# Patient Record
Sex: Female | Born: 1975 | Race: Black or African American | Hispanic: No | Marital: Single | Smoking: Former smoker
Health system: Southern US, Community
[De-identification: ages and names within clinical notes are randomized; demographics above are authoritative.]

## PROBLEM LIST (undated history)

## (undated) DIAGNOSIS — N8003 Adenomyosis of the uterus: Secondary | ICD-10-CM

## (undated) DIAGNOSIS — J40 Bronchitis, not specified as acute or chronic: Secondary | ICD-10-CM

## (undated) DIAGNOSIS — K219 Gastro-esophageal reflux disease without esophagitis: Secondary | ICD-10-CM

## (undated) DIAGNOSIS — D649 Anemia, unspecified: Secondary | ICD-10-CM

## (undated) DIAGNOSIS — M5430 Sciatica, unspecified side: Secondary | ICD-10-CM

## (undated) DIAGNOSIS — N921 Excessive and frequent menstruation with irregular cycle: Secondary | ICD-10-CM

## (undated) DIAGNOSIS — N800 Endometriosis of uterus: Secondary | ICD-10-CM

## (undated) DIAGNOSIS — N809 Endometriosis, unspecified: Secondary | ICD-10-CM

## (undated) DIAGNOSIS — I2699 Other pulmonary embolism without acute cor pulmonale: Secondary | ICD-10-CM

## (undated) DIAGNOSIS — N750 Cyst of Bartholin's gland: Secondary | ICD-10-CM

## (undated) DIAGNOSIS — J189 Pneumonia, unspecified organism: Secondary | ICD-10-CM

## (undated) DIAGNOSIS — F419 Anxiety disorder, unspecified: Secondary | ICD-10-CM

## (undated) HISTORY — PX: DILATION AND CURETTAGE OF UTERUS: SHX78

## (undated) HISTORY — PX: ABDOMINAL HYSTERECTOMY: SHX81

## (undated) HISTORY — DX: Endometriosis of uterus: N80.0

## (undated) HISTORY — DX: Adenomyosis of the uterus: N80.03

## (undated) HISTORY — PX: UMBILICAL HERNIA REPAIR: SHX196

## (undated) HISTORY — PX: TUBAL LIGATION: SHX77

## (undated) HISTORY — DX: Other pulmonary embolism without acute cor pulmonale: I26.99

## (undated) HISTORY — DX: Excessive and frequent menstruation with irregular cycle: N92.1

## (undated) HISTORY — PX: LAPAROSCOPY: SHX197

## (undated) HISTORY — PX: LUMBAR SPINE SURGERY: SHX701

## (undated) HISTORY — PX: BACK SURGERY: SHX140

## (undated) HISTORY — DX: Endometriosis, unspecified: N80.9

---

## 1996-07-08 HISTORY — PX: CHOLECYSTECTOMY, LAPAROSCOPIC: SHX56

## 2006-10-15 ENCOUNTER — Emergency Department (HOSPITAL_COMMUNITY): Admission: EM | Admit: 2006-10-15 | Discharge: 2006-10-15 | Payer: Self-pay | Admitting: Emergency Medicine

## 2007-02-26 ENCOUNTER — Emergency Department (HOSPITAL_COMMUNITY): Admission: EM | Admit: 2007-02-26 | Discharge: 2007-02-26 | Payer: Self-pay | Admitting: Emergency Medicine

## 2007-07-09 HISTORY — PX: CERVICAL SPINE SURGERY: SHX589

## 2007-07-12 ENCOUNTER — Emergency Department (HOSPITAL_COMMUNITY): Admission: EM | Admit: 2007-07-12 | Discharge: 2007-07-12 | Payer: Self-pay | Admitting: Emergency Medicine

## 2008-01-15 ENCOUNTER — Emergency Department (HOSPITAL_COMMUNITY): Admission: EM | Admit: 2008-01-15 | Discharge: 2008-01-15 | Payer: Self-pay | Admitting: Emergency Medicine

## 2008-05-30 ENCOUNTER — Emergency Department (HOSPITAL_COMMUNITY): Admission: EM | Admit: 2008-05-30 | Discharge: 2008-05-30 | Payer: Self-pay | Admitting: Emergency Medicine

## 2008-06-10 ENCOUNTER — Emergency Department (HOSPITAL_COMMUNITY): Admission: EM | Admit: 2008-06-10 | Discharge: 2008-06-10 | Payer: Self-pay | Admitting: Emergency Medicine

## 2008-06-14 ENCOUNTER — Ambulatory Visit (HOSPITAL_COMMUNITY): Admission: RE | Admit: 2008-06-14 | Discharge: 2008-06-14 | Payer: Self-pay | Admitting: Emergency Medicine

## 2008-06-14 ENCOUNTER — Emergency Department (HOSPITAL_COMMUNITY): Admission: EM | Admit: 2008-06-14 | Discharge: 2008-06-14 | Payer: Self-pay | Admitting: Emergency Medicine

## 2008-06-20 ENCOUNTER — Emergency Department (HOSPITAL_COMMUNITY): Admission: EM | Admit: 2008-06-20 | Discharge: 2008-06-20 | Payer: Self-pay | Admitting: Emergency Medicine

## 2008-06-24 ENCOUNTER — Inpatient Hospital Stay (HOSPITAL_COMMUNITY): Admission: EM | Admit: 2008-06-24 | Discharge: 2008-06-30 | Payer: Self-pay | Admitting: Emergency Medicine

## 2008-06-28 ENCOUNTER — Encounter (INDEPENDENT_AMBULATORY_CARE_PROVIDER_SITE_OTHER): Payer: Self-pay | Admitting: Orthopedic Surgery

## 2009-03-03 ENCOUNTER — Ambulatory Visit (HOSPITAL_COMMUNITY): Admission: RE | Admit: 2009-03-03 | Discharge: 2009-03-03 | Payer: Self-pay | Admitting: Orthopedic Surgery

## 2010-02-25 ENCOUNTER — Emergency Department (HOSPITAL_COMMUNITY): Admission: EM | Admit: 2010-02-25 | Discharge: 2010-02-25 | Payer: Self-pay | Admitting: Emergency Medicine

## 2010-03-05 ENCOUNTER — Ambulatory Visit (HOSPITAL_COMMUNITY): Admission: RE | Admit: 2010-03-05 | Discharge: 2010-03-05 | Payer: Self-pay | Admitting: Orthopedic Surgery

## 2010-03-19 ENCOUNTER — Emergency Department (HOSPITAL_COMMUNITY): Admission: EM | Admit: 2010-03-19 | Discharge: 2010-03-19 | Payer: Self-pay | Admitting: Emergency Medicine

## 2010-04-23 ENCOUNTER — Emergency Department (HOSPITAL_COMMUNITY): Admission: EM | Admit: 2010-04-23 | Discharge: 2010-04-23 | Payer: Self-pay | Admitting: Emergency Medicine

## 2010-09-19 LAB — GC/CHLAMYDIA PROBE AMP, GENITAL: GC Probe Amp, Genital: NEGATIVE

## 2010-09-19 LAB — URINALYSIS, ROUTINE W REFLEX MICROSCOPIC
Bilirubin Urine: NEGATIVE
Ketones, ur: NEGATIVE mg/dL
Nitrite: NEGATIVE
Specific Gravity, Urine: 1.023 (ref 1.005–1.030)

## 2010-09-19 LAB — URINE MICROSCOPIC-ADD ON

## 2010-09-20 LAB — COMPREHENSIVE METABOLIC PANEL WITH GFR
ALT: 15 U/L (ref 0–35)
AST: 19 U/L (ref 0–37)
Albumin: 3.6 g/dL (ref 3.5–5.2)
Alkaline Phosphatase: 74 U/L (ref 39–117)
BUN: 6 mg/dL (ref 6–23)
CO2: 24 meq/L (ref 19–32)
Calcium: 9.2 mg/dL (ref 8.4–10.5)
Chloride: 109 meq/L (ref 96–112)
Creatinine, Ser: 0.76 mg/dL (ref 0.4–1.2)
GFR calc non Af Amer: >60 mL/min
Glucose, Bld: 83 mg/dL (ref 70–99)
Potassium: 3.7 meq/L (ref 3.5–5.1)
Sodium: 141 meq/L (ref 135–145)
Total Bilirubin: 0.4 mg/dL (ref 0.3–1.2)
Total Protein: 6.5 g/dL (ref 6.0–8.3)

## 2010-09-20 LAB — LIPASE, BLOOD: Lipase: 21 U/L (ref 11–59)

## 2010-09-20 LAB — URINE CULTURE
Colony Count: >=100000
Culture  Setup Time: 201109121848

## 2010-09-20 LAB — GC/CHLAMYDIA PROBE AMP, GENITAL
Chlamydia, DNA Probe: NEGATIVE
GC Probe Amp, Genital: NEGATIVE

## 2010-09-20 LAB — URINALYSIS, ROUTINE W REFLEX MICROSCOPIC
Bilirubin Urine: NEGATIVE
Glucose, UA: NEGATIVE mg/dL
Hgb urine dipstick: NEGATIVE
Ketones, ur: NEGATIVE mg/dL
Nitrite: POSITIVE — AB
Protein, ur: NEGATIVE mg/dL
Specific Gravity, Urine: 1.013 (ref 1.005–1.030)
Urobilinogen, UA: 0.2 mg/dL (ref 0.0–1.0)
pH: 6.5 (ref 5.0–8.0)

## 2010-09-20 LAB — DIFFERENTIAL
Basophils Relative: 0 % (ref 0–1)
Lymphocytes Relative: 19 % (ref 12–46)
Neutro Abs: 9.2 10*3/uL — ABNORMAL HIGH (ref 1.7–7.7)
Neutrophils Relative %: 72 % (ref 43–77)

## 2010-09-20 LAB — WET PREP, GENITAL: Yeast Wet Prep HPF POC: NONE SEEN

## 2010-09-20 LAB — CBC
Hemoglobin: 9.5 g/dL — ABNORMAL LOW (ref 12.0–15.0)
MCH: 23.3 pg — ABNORMAL LOW (ref 26.0–34.0)
Platelets: 460 10*3/uL — ABNORMAL HIGH (ref 150–400)
RDW: 17.5 % — ABNORMAL HIGH (ref 11.5–15.5)
WBC: 12.8 10*3/uL — ABNORMAL HIGH (ref 4.0–10.5)

## 2010-09-20 LAB — URINE MICROSCOPIC-ADD ON

## 2010-09-20 LAB — POCT PREGNANCY, URINE: Preg Test, Ur: NEGATIVE

## 2010-10-29 ENCOUNTER — Inpatient Hospital Stay (HOSPITAL_COMMUNITY): Payer: Medicaid Other

## 2010-10-29 ENCOUNTER — Inpatient Hospital Stay (HOSPITAL_COMMUNITY)
Admission: AD | Admit: 2010-10-29 | Discharge: 2010-10-29 | Disposition: A | Discharge status: Home / Self Care | Payer: Medicaid Other | Source: Ambulatory Visit | Attending: Obstetrics & Gynecology | Admitting: Obstetrics & Gynecology

## 2010-10-29 DIAGNOSIS — R109 Unspecified abdominal pain: Secondary | ICD-10-CM | POA: Insufficient documentation

## 2010-10-29 DIAGNOSIS — N949 Unspecified condition associated with female genital organs and menstrual cycle: Secondary | ICD-10-CM | POA: Insufficient documentation

## 2010-10-29 DIAGNOSIS — N938 Other specified abnormal uterine and vaginal bleeding: Secondary | ICD-10-CM | POA: Insufficient documentation

## 2010-10-29 LAB — WET PREP, GENITAL
Trich, Wet Prep: NONE SEEN
Yeast Wet Prep HPF POC: NONE SEEN

## 2010-10-29 LAB — CBC
Hemoglobin: 10.7 g/dL — ABNORMAL LOW (ref 12.0–15.0)
MCH: 24.5 pg — ABNORMAL LOW (ref 26.0–34.0)
MCHC: 31 g/dL (ref 30.0–36.0)
Platelets: 432 10*3/uL — ABNORMAL HIGH (ref 150–400)

## 2010-10-30 LAB — GC/CHLAMYDIA PROBE AMP, GENITAL: GC Probe Amp, Genital: NEGATIVE

## 2010-11-14 ENCOUNTER — Encounter: Payer: Self-pay | Admitting: Family Medicine

## 2010-11-20 NOTE — Op Note (Signed)
NAMEESSIE, LAGUNES NO.:  [id number]   MEDICAL RECORD NO.:  [id number]          PATIENT TYPE:  INP   LOCATION:  5039                         FACILITY:  [facility]   PHYSICIAN:  Nelda Severe, MD      DATE OF BIRTH:  1976-06-09   DATE OF PROCEDURE:  06/28/2008  DATE OF DISCHARGE:                               OPERATIVE REPORT   SURGEON:  Nelda Severe, MD   ASSISTANT:  Lianne Cure, PA   PREOPERATIVE DIAGNOSES:  C5-C6 cervical spondylosis/stenosis, C6-C7 disk  herniation left side with severe left brachialgia.   POSTOPERATIVE DIAGNOSES:  C5-C6 cervical spondylosis/stenosis, C6-C7  disk herniation left side with severe left brachialgia.   OPERATIVE PROCEDURE:  Anterior decompression and fusion C5-C6 and C6-C7  with autogenous iliac crest graft (left side), interbody cages, screws,  and plate.   INDICATIONS FOR SURGERY:  Severe intractable left-sided arm pain  requiring hospitalization.   DESCRIPTION OF PROCEDURE:  The patient was placed under general  endotracheal anesthesia.  Foley catheter was placed in the bladder.  Intravenous antibiotics were infused prophylactically.  Sequential  compression devices were placed on both lower extremities.  She was  positioned supine on the [place] flat-top table.  The arms were tucked  at the sides and a rolled sheet was placed transversely under the  shoulder blades to allow for extension of the neck.  The head was  supported on a foam donut.  The lower extremities were supported on  pillows.   The left iliac crest area was prepped with DuraPrep and draped with  towels and the towels secured with Ioban.  The anterior cervical spine  area was draped with towels and the towels secured with Ioban after  sterile preparation with DuraPrep.  Prior to prepping, we palpated the  carotid tubercle and placed a slightly oblique transverse skin marker at  the appropriate level for the incision.   After draping was complete, a  time-out was held in which the patient was  identified, the preoperative diagnosis, the intended surgery, etc.   A slightly oblique incision was made on the left side from midline to  the anterior border of the sternocleidomastoid just above the level of  the carotid tubercle.  Platysma was incised in line with the incision.  The deep cervical fascia was bluntly dissected down into the  prevertebral area.  The longus coli muscles were identified bilaterally.  The carotid sheath was retracted laterally and the esophagus and trachea  retracted medially.   A spinal needle bent in Z fashion was placed what we perceived to be C5-  C6.  A cross-table lateral radiograph was ordered and taken which proved  to be at C5-C6.  In the meantime, while waiting for the radiograph to be  processed, we made a short incision over the left iliac crest  approximately 3 inches posterior to the anterior superior iliac spine.  Dissection was carried down to the insertion of the external oblique  aponeurosis into the iliac crest.  Insertion of the muscle was released  using cutting current and the top iliac crest identified.  Retractors  were placed.  The superior aspect of the crest was fenestrated using a  small gouge and moderate quantity of bone graft harvested from between  the inner and outer tables of the ileum using a curette.  A piece of  Gelfoam was placed in the bony defect to control bleeding.   We then returned our attention to the cervical wound.  Having positively  identified the correct level, longus coli muscles were then mobilized  from body of C5 to body of C7 bilaterally.  A Shadow-Line retractor was  then placed with the retractor tubes deep to the longus colli muscles.  Caspar distracting pins were then placed above and below the C5-C6 disk  space and the distractor attached and tension applied.  I then incised  the anterior disk annulus sharply.  We then enucleated the disk with a   combination of curettes, rongeurs etc.  In the posterior aspect of the  disk, it was very narrow, and there were obvious spondylophytes  posteriorly.  A high-speed bur was used to bur away the posterior spinal  sites above and below the disk space thereby opening it up.  Eventually,  we were able to curette away posterior longitudinal ligament from the  posterior aspect of the vertebral bodies above and below and a 2-mm  Kerrison was admitted.  We then formed a central decompression above and  below and working laterally to the left side where the neural foramen  was decompressed.  We then made sure that all endplate cartilage had  been curetted away or burred away, and the disk space was sized for a 6-  mm interbody fusion cage.  The cage was packed with bone, inserted in  the disk space, and the distractor tension released and the cage gripped  by the adjacent endplates.   We then moved distally, moving the retractor blades, removing the C5  Caspar pin and placing it in the C7 body and applying distraction at the  C6-C7 level.  The annulus was incised in similar fashion that I  described for C5-C6.  The disk was then enucleated.  There was a copious  quantity of nucleus which was somewhat fragmented.  We curetted away  disk back to the posterior longitudinal ligament.  Ultimately, I was  able to use a small probe to detect the rent in posterior ligament  through which the nucleus pulposus had herniated on the left side.  The  posterior longitudinal ligament was then incised on the left side and  several moderately large fragments of degenerate nucleus pulposus were  hooked out of the spinal canal and neural foramen.  We further  decompressed centrally.  When I felt that an adequate decompression had  been performed, decompressive efforts were ceased.  We made sure that  all endplate cartilage above and below had been burred or curetted away.  The space was sized to a 7-mm cage.  The  cage was packed with bone graft  and the graft placed.  The distractor was then let off and the endplates  gripped the cage.   Remaining graft was then packed on the right side of the C6-C7 cage and  anteriorly in front of both cages.  A 39-mm titanium plate was then  chosen and applied with two screws at C5, two screws C6, and two screws  at C7.  Cross-table lateral radiographs taken showed a satisfactory  position of screws, plate, interbody cages in the correct level.   The cervical wound was  irrigated.  A small Silastic drain was placed in  the depths of the wound and brought out through the skin to the right  side.  It was secured with a 3-0 nylon suture in a basket weave fashion.  Platysma was closed using interrupted 0 Vicryl suture.  Subcutaneous  layer and skin were closed using continuous subcuticular 3-0 undyed  Vicryl suture.  A bacitracin ointment dressing was applied and a gauze  dressing applied.  This was secured with a green surgical towel.   A 1-0 Vicryl suture was placed in the fascia in the depths of the iliac  crest harvest wound.  The subcutaneous tissue was closed using  interrupted 2-0 Vicryl suture.  The skin was closed using a subcuticular  3-0 undyed Vicryl in running fashion.  Antibiotic ointment dressing was  applied to the iliac crest wound and secured with Hypafix tape.   The blood loss for the procedure was negligible.  There were no  intraoperative complications.  Sponge and needle counts were correct.  The patient was not yet been examined in the recovery room  postoperatively.  Therefore, no neurological examination was reported  here.      Nelda Severe, MD  Electronically Signed     MT/MEDQ  D:  06/28/2008  T:  06/28/2008  Job:  [id number]

## 2010-11-23 NOTE — Discharge Summary (Signed)
Megan Chandler, PAONE NO.:  [id number]   MEDICAL RECORD NO.:  [id number]          PATIENT TYPE:  INP   LOCATION:  5039                         FACILITY:  [facility]   PHYSICIAN:  Nelda Severe, MD      DATE OF BIRTH:  1976-01-16   DATE OF ADMISSION:  06/24/2008  DATE OF DISCHARGE:  06/30/2008                               DISCHARGE SUMMARY   She was admitted [facility] on June 24, 2008, through the emergency  room secondary to neck pain and left arm pain.  She was admitted to 5000  secondary to C6-7 herniated disk, left side brachialgia, severe needed  for pain control, and possible disk excision and fusion.  On June 26, 2008, she was worse.  I started her on Chantix.  She did undergo  disk excision and fusion by Dr. Nelda Severe with the assistance of  Lianne Cure, PA-C and she had postoperative vomiting.  She did have  a positive urinary tract infection.  She was treated with Cipro.  Her  white count was elevated at 18.1.  Hemoglobin was stable at 10.2.  The  surgery was performed on June 28, 2008, anterior decompression and  fusion at C5-6, C6-7.  We did have physical therapy for consultation for  mobility.  The patient was placed in a soft collar for stability  secondary to pain.  Left arm felt significantly better postoperatively.  Her throat was sore, but overall neurovascularly motor intact.  Slight  decreased grip strength on the left 4+/5, on the right 5/5.  Incision  clean, dry, and intact.  Drain was removed.  The patient is instructed  on a soft diet for 7 days' total postoperatively which was carried out  in the hospital on June 30, 2008.  The patient was afebrile.  Vital  signs were stable.  Incision is clean, dry, and intact.  No dressing was  needed.  Distally, neurovascularly motor intact bilateral upper  extremities, equal.  She was discharged home.   DIAGNOSIS:  Cervical disk herniation at C5-6, C6-7.   DIET:  Soft.   DISPOSITION:  Stable.  Follow up in 1 week with Dr. Nelda Severe.  Hip  incision was also clean, dry, and intact from iliac crest bone graft.  She was discharged home with Norco 10 for pain medication and Robaxin  for muscle spasm medication.      Lianne Cure, P.A.      Nelda Severe, MD  Electronically Signed    MC/MEDQ  D:  08/17/2008  T:  08/18/2008  Job:  [id number]

## 2010-12-12 ENCOUNTER — Ambulatory Visit: Payer: Self-pay | Admitting: Obstetrics and Gynecology

## 2011-01-17 ENCOUNTER — Inpatient Hospital Stay (HOSPITAL_COMMUNITY)
Admission: EM | Admit: 2011-01-17 | Discharge: 2011-01-17 | Disposition: A | Discharge status: Home / Self Care | Payer: Medicaid Other | Source: Ambulatory Visit | Attending: Obstetrics & Gynecology | Admitting: Obstetrics & Gynecology

## 2011-01-17 ENCOUNTER — Encounter (HOSPITAL_COMMUNITY): Payer: Self-pay | Admitting: *Deleted

## 2011-01-17 DIAGNOSIS — N390 Urinary tract infection, site not specified: Secondary | ICD-10-CM | POA: Insufficient documentation

## 2011-01-17 DIAGNOSIS — N938 Other specified abnormal uterine and vaginal bleeding: Secondary | ICD-10-CM | POA: Insufficient documentation

## 2011-01-17 DIAGNOSIS — N949 Unspecified condition associated with female genital organs and menstrual cycle: Secondary | ICD-10-CM | POA: Insufficient documentation

## 2011-01-17 DIAGNOSIS — R102 Pelvic and perineal pain: Secondary | ICD-10-CM

## 2011-01-17 DIAGNOSIS — N939 Abnormal uterine and vaginal bleeding, unspecified: Secondary | ICD-10-CM

## 2011-01-17 HISTORY — DX: Anemia, unspecified: D64.9

## 2011-01-17 LAB — CBC
HCT: 34.5 % — ABNORMAL LOW (ref 36.0–46.0)
Hemoglobin: 11 g/dL — ABNORMAL LOW (ref 12.0–15.0)
MCV: 79.7 fL (ref 78.0–100.0)
RBC: 4.33 MIL/uL (ref 3.87–5.11)
RDW: 16.9 % — ABNORMAL HIGH (ref 11.5–15.5)
WBC: 15.5 10*3/uL — ABNORMAL HIGH (ref 4.0–10.5)

## 2011-01-17 LAB — URINE MICROSCOPIC-ADD ON

## 2011-01-17 LAB — URINALYSIS, ROUTINE W REFLEX MICROSCOPIC
Glucose, UA: NEGATIVE mg/dL
Protein, ur: NEGATIVE mg/dL
Specific Gravity, Urine: >1.03 — ABNORMAL HIGH (ref 1.005–1.030)
pH: 6 (ref 5.0–8.0)

## 2011-01-17 LAB — WET PREP, GENITAL: Clue Cells Wet Prep HPF POC: NONE SEEN

## 2011-01-17 MED ORDER — KETOROLAC TROMETHAMINE 60 MG/2ML IM SOLN
30.0000 mg | Freq: Once | INTRAMUSCULAR | Status: AC
  Administered 2011-01-17: 30 mg via INTRAMUSCULAR
  Filled 2011-01-17: qty 2

## 2011-01-17 MED ORDER — CIPROFLOXACIN HCL 500 MG PO TABS: 500.0000 mg | ORAL_TABLET | Freq: Two times a day (BID) | ORAL | Status: AC

## 2011-01-17 MED ORDER — TRAMADOL HCL 50 MG PO TABS: 50.0000 mg | ORAL_TABLET | Freq: Four times a day (QID) | ORAL | Status: AC | PRN

## 2011-01-17 NOTE — ED Notes (Signed)
Pt asking for pain medications.  Providers aware, currently tied up.  Pt unable to void, states has been trying all day.  Explained need to r/o  preg and check for UTI.   Will proceed with i/o cath to collect spec.

## 2011-01-17 NOTE — Progress Notes (Signed)
Bleeding for 3wks.  Has been nuvaring for 8 months, had not been having periods.   Here before for the same thing, was told has endometriosis.  At orthopedic today, had spine injections.  Was told iron was low.

## 2011-01-17 NOTE — ED Provider Notes (Signed)
History     Chief Complaint  Patient presents with  . Abdominal Pain   Patient is a 35 y.o. female presenting with abdominal pain. The history is provided by the patient.  Abdominal Pain The primary symptoms of the illness include abdominal pain. The primary symptoms of the illness do not include fever. The current episode started more than 2 days ago.  Additional symptoms associated with the illness include back pain. Symptoms associated with the illness do not include chills.  Megan Chandler 35 y.o. with abdominal pain and vaginal bleeding. Pt. States she saw a doctor in [facility] and had pap and nuva ring. Past 2 months has had irregular bleeding and severe abdominal pain. Was here last month and had u/s. Pt. Told she had endometrosis and tx with Lortab. Has follow up appt. With GYN clinic in August but is out of pain medication and here for medication until her appt..   Past Medical History  Diagnosis Date  . Anemia   . Asthma   . Urinary tract infection   . Chronic low back pain   . Endometriosis     Past Surgical History  Procedure Date  . Dilation and curettage of uterus   . Cholecystectomy, laparoscopic   . Umbilical hernia repair   . Cesarean section   . Back surgery     Family History  Problem Relation Age of Onset  . Hypertension Mother   . Cancer Maternal Aunt   . Cancer Maternal Uncle   . Diabetes Maternal Grandmother     History  Substance Use Topics  . Smoking status: Current Some Day Smoker -- 2 years    Types: Cigars  . Smokeless tobacco: Never Used  . Alcohol Use: 0.0 oz/week    0 Glasses of wine per week    Allergies:  Allergies  Allergen Reactions  . Morphine And Related Anaphylaxis  . Aspirin Itching  . Penicillins Itching  . Vicodin (Hydrocodone-Acetaminophen) Itching    Prescriptions prior to admission  Medication Sig Dispense Refill  . [employer] IBUPROFEN IB PO Take 1 tablet by mouth 4 (four) times daily as needed.        . traMADol  (ULTRAM) 50 MG tablet Take 50 mg by mouth 2 (two) times daily.          Review of Systems  Constitutional: Negative for fever, chills and weight loss.  HENT: Negative.   Eyes: Negative.   Respiratory: Negative.   Gastrointestinal: Positive for abdominal pain.  Musculoskeletal: Positive for back pain.  Skin: Negative.   Neurological: Negative.    Physical Exam General Appearance:    Alert, cooperative, no distress, appears stated age  Head:    Normocephalic, without obvious abnormality, atraumatic        Genitalia:    Normal female without lesions  Extremities:   Extremities normal, atraumatic, no cyanosis or edema  Skin:   Skin color, texture, turgor normal, no rashes or lesions  Neurologic: Alert and oriented, normal gait.     Blood pressure 129/77, pulse 76, temperature 97.3 F (36.3 C), temperature source Oral, resp. rate 22, height 5\' 6"  (1.676 m), weight 150 lb (68.04 kg).  Physical Exam  Nursing note and vitals reviewed. Constitutional: She is oriented to person, place, and time. She appears well-developed and well-nourished.  Neck: Neck supple.  GI: Soft. There is tenderness.    Genitourinary:       Vaginal bleeding is moderate.  Musculoskeletal:  Pt. Evaluated by ortho. Today for back pain and muscle pain.  Neurological: She is alert and oriented to person, place, and time. No cranial nerve deficit.  Skin: Skin is warm and dry.    MAU Course  Procedures  MDM Will treat patient's pain with Toradol now and give Rx for pain medication. She will keep her appt. With the GYN Clinic.   [place], [place] 01/17/11 [phone]

## 2011-01-17 NOTE — ED Notes (Signed)
Delay explained.  Pt tearful.   When using Nuvaring... Would leave in place for 4 wks, then replace.  Never tood a wk off.  Removed 3 days ago, bleeding started about 2 wks ago.  Cramping has been this bad for past wk.  Has been passing quarter sized clots

## 2011-01-18 LAB — GC/CHLAMYDIA PROBE AMP, GENITAL
Chlamydia, DNA Probe: NEGATIVE
GC Probe Amp, Genital: NEGATIVE

## 2011-02-03 ENCOUNTER — Emergency Department (HOSPITAL_COMMUNITY)
Admission: EM | Admit: 2011-02-03 | Discharge: 2011-02-03 | Disposition: A | Discharge status: Home / Self Care | Payer: Medicaid Other | Attending: Emergency Medicine | Admitting: Emergency Medicine

## 2011-02-03 ENCOUNTER — Emergency Department (HOSPITAL_COMMUNITY): Payer: Medicaid Other

## 2011-02-03 DIAGNOSIS — N940 Mittelschmerz: Secondary | ICD-10-CM | POA: Insufficient documentation

## 2011-02-03 DIAGNOSIS — M069 Rheumatoid arthritis, unspecified: Secondary | ICD-10-CM | POA: Insufficient documentation

## 2011-02-03 DIAGNOSIS — K5730 Diverticulosis of large intestine without perforation or abscess without bleeding: Secondary | ICD-10-CM | POA: Insufficient documentation

## 2011-02-03 DIAGNOSIS — R110 Nausea: Secondary | ICD-10-CM | POA: Insufficient documentation

## 2011-02-03 DIAGNOSIS — J45909 Unspecified asthma, uncomplicated: Secondary | ICD-10-CM | POA: Insufficient documentation

## 2011-02-03 DIAGNOSIS — N831 Corpus luteum cyst of ovary, unspecified side: Secondary | ICD-10-CM | POA: Insufficient documentation

## 2011-02-03 DIAGNOSIS — R1031 Right lower quadrant pain: Secondary | ICD-10-CM | POA: Insufficient documentation

## 2011-02-03 DIAGNOSIS — R109 Unspecified abdominal pain: Secondary | ICD-10-CM | POA: Insufficient documentation

## 2011-02-03 LAB — URINE MICROSCOPIC-ADD ON

## 2011-02-03 LAB — URINALYSIS, ROUTINE W REFLEX MICROSCOPIC
Bilirubin Urine: NEGATIVE
Glucose, UA: NEGATIVE mg/dL
Ketones, ur: NEGATIVE mg/dL
Nitrite: NEGATIVE
Protein, ur: NEGATIVE mg/dL
Specific Gravity, Urine: 1.007 (ref 1.005–1.030)
Urobilinogen, UA: 0.2 mg/dL (ref 0.0–1.0)
pH: 7 (ref 5.0–8.0)

## 2011-02-03 LAB — WET PREP, GENITAL

## 2011-02-04 LAB — GC/CHLAMYDIA PROBE AMP, GENITAL
Chlamydia, DNA Probe: NEGATIVE
GC Probe Amp, Genital: NEGATIVE

## 2011-02-15 ENCOUNTER — Ambulatory Visit (INDEPENDENT_AMBULATORY_CARE_PROVIDER_SITE_OTHER): Payer: Medicaid Other | Admitting: Obstetrics & Gynecology

## 2011-02-15 ENCOUNTER — Encounter: Payer: Self-pay | Admitting: Obstetrics & Gynecology

## 2011-02-15 ENCOUNTER — Other Ambulatory Visit (HOSPITAL_COMMUNITY)
Admission: RE | Admit: 2011-02-15 | Discharge: 2011-02-15 | Disposition: A | Discharge status: Home / Self Care | Payer: Medicaid Other | Source: Ambulatory Visit | Attending: Obstetrics & Gynecology | Admitting: Obstetrics & Gynecology

## 2011-02-15 VITALS — BP 115/71 | HR 82 | Temp 97.6°F | Ht 66.0 in | Wt 146.8 lb

## 2011-02-15 DIAGNOSIS — N920 Excessive and frequent menstruation with regular cycle: Secondary | ICD-10-CM

## 2011-02-15 DIAGNOSIS — N949 Unspecified condition associated with female genital organs and menstrual cycle: Secondary | ICD-10-CM | POA: Insufficient documentation

## 2011-02-15 DIAGNOSIS — N938 Other specified abnormal uterine and vaginal bleeding: Secondary | ICD-10-CM | POA: Insufficient documentation

## 2011-02-15 DIAGNOSIS — N921 Excessive and frequent menstruation with irregular cycle: Secondary | ICD-10-CM | POA: Insufficient documentation

## 2011-02-15 LAB — CBC
MCH: 24.8 pg — ABNORMAL LOW (ref 26.0–34.0)
MCHC: 30.4 g/dL (ref 30.0–36.0)
RDW: 17.8 % — ABNORMAL HIGH (ref 11.5–15.5)

## 2011-02-15 LAB — TSH: TSH: 0.491 u[IU]/mL (ref 0.350–4.500)

## 2011-02-15 LAB — PROLACTIN: Prolactin: 7.1 ng/mL

## 2011-02-15 LAB — POCT PREGNANCY, URINE: Preg Test, Ur: NEGATIVE

## 2011-02-15 MED ORDER — IBUPROFEN 200 MG PO TABS: 600.0000 mg | ORAL_TABLET | Freq: Once | ORAL | Status: DC

## 2011-02-15 NOTE — Progress Notes (Signed)
  Patient is here for workup of abnormal bleeding. Was seen in there MAU and underwent pelvic ultrasound.  Still reports prolonged, heavy vaginal bleeding.  No other symptoms.  Having some spotting now.  Pelvic Ultrasound (02/03/11) Uterus: Measures 10.3 x 5.8 x 7.9 cm. Diffusely inhomogeneous echotexture of the myometrium noted, but no discrete myometrial masses identified.  Endometrium: 6 mm double layer thickness measured transvaginally. Normal trilayer appearance.  Right ovary: Measures 4.0 x 2.9 x 3.4 cm. A 2 x 3 cm corpus luteum is noted with no evidence of internal blood flow. Blood flow is seen within the remainder of the right ovary on color Doppler ultrasound.  Left ovary: Measures 2.3 x 1.9 x 1.5 cm. Normal appearance.  Blood flow is seen within the left ovary on color Doppler ultrasound.  Pulsed Doppler evaluation demonstrates normal low-resistance arterial and venous waveforms in both ovaries.  IMPRESSION:  1. 2 x 3 cm right ovarian corpus luteum noted. No evidence of ovarian mass or torsion.  2. Mildly enlarged uterus with heterogeneous myometrial echotexture, which may be seen with adenomyosis. No discrete fibroids identified. If clinically warranted, pelvis MRI could be performed for further evaluation.  I reviewed patient's PMH, PSH, medications, allergies, prior studies.  Patient counseled regarding need for endometrial biopsy.    Endometrial Biopsy Procedure Note Pre-operative Diagnosis: Menometrorrhagia Post-operative Diagnosis: same Indications: abnormal uterine bleeding Procedure Details  Urine pregnancy test was done and result was negative.  The risks (including infection, bleeding, pain, and uterine perforation) and benefits of the procedure were explained to the patient and Written informed consent was obtained.   The patient was placed in the dorsal lithotomy position.  Bimanual exam showed the uterus to be in the neutral position.  A Graves' speculum inserted in the  vagina, and the cervix prepped with povidone iodine.   A sharp tenaculum was applied to the anterior lip of the cervix for stabilization.  A sterile uterine sound was used to sound the uterus to a depth of 10 cm.  A Pipelle endometrial aspirator was used to sample the endometrium.  Sample was sent for pathologic examination. Condition: Stable Complications: None Plan:  The patient was advised to call for any fever or for prolonged or severe pain or bleeding. She was advised to use NSAID as needed for mild to moderate pain. She was advised to avoid vaginal intercourse for 48 hours or until the bleeding has completely stopped.  A/P:  Will follow up endometrial biopsy results and manage accordingly. Follow up TSH, CBC, prolactin; labs drawn today. Bleeding precautions reviewed.  Perrie Ragin A 02/15/2011 10:21 AM

## 2011-02-15 NOTE — Patient Instructions (Signed)
See patient instruction sheet

## 2011-02-15 NOTE — Progress Notes (Signed)
Addended by: Lynnell Dike on: 02/15/2011 12:21 PM   Modules accepted: Orders

## 2011-02-15 NOTE — Progress Notes (Signed)
Last pap was 6-7 months ago. Bleeding the whole month of July.

## 2011-02-19 ENCOUNTER — Telehealth: Payer: Self-pay | Admitting: *Deleted

## 2011-02-19 NOTE — Telephone Encounter (Signed)
Pt left message requesting results of biopsy from 02/15/11. I called pt back and left message that we will wait to hear from Dr. Macon Large and call her back with pertinent info.

## 2011-02-20 NOTE — Telephone Encounter (Signed)
Called pt- heard message stating that this person is not accepting calls @ this time and to try back later.

## 2011-02-20 NOTE — Telephone Encounter (Signed)
Patient's endometrial biopsy is benign; she can come back for discussion of further management of her bleeding.  OCPs, Mirena, Provera or surgical management will be discussed.

## 2011-02-21 ENCOUNTER — Inpatient Hospital Stay (HOSPITAL_COMMUNITY)
Admission: AD | Admit: 2011-02-21 | Discharge: 2011-02-22 | Disposition: A | Discharge status: Home / Self Care | Payer: Medicaid Other | Source: Ambulatory Visit | Attending: Obstetrics & Gynecology | Admitting: Obstetrics & Gynecology

## 2011-02-21 DIAGNOSIS — N764 Abscess of vulva: Secondary | ICD-10-CM | POA: Insufficient documentation

## 2011-02-21 DIAGNOSIS — N949 Unspecified condition associated with female genital organs and menstrual cycle: Secondary | ICD-10-CM | POA: Insufficient documentation

## 2011-02-21 NOTE — Progress Notes (Signed)
SAYS HAD HAS BEEN BLEEDING X1 MTH, HAD  ENDOMETR  BX ON Friday IN  GYN CLINIC WITH DR Nada Maclachlan.- HAS NOT HEARD RESULTS.  PAIN  THIS BAD AT 10PM.  DID NOT TAKE ANYTHING.

## 2011-02-21 NOTE — Progress Notes (Signed)
ASKING  FOR PAIN PILL.

## 2011-02-21 NOTE — Telephone Encounter (Signed)
Called patient, Left message for patient to return our call.

## 2011-02-22 ENCOUNTER — Emergency Department (HOSPITAL_COMMUNITY)
Admission: EM | Admit: 2011-02-22 | Discharge: 2011-02-22 | Disposition: A | Discharge status: Home / Self Care | Payer: Medicaid Other | Attending: Emergency Medicine | Admitting: Emergency Medicine

## 2011-02-22 DIAGNOSIS — L03319 Cellulitis of trunk, unspecified: Secondary | ICD-10-CM | POA: Insufficient documentation

## 2011-02-22 DIAGNOSIS — N9489 Other specified conditions associated with female genital organs and menstrual cycle: Secondary | ICD-10-CM | POA: Insufficient documentation

## 2011-02-22 DIAGNOSIS — R109 Unspecified abdominal pain: Secondary | ICD-10-CM | POA: Insufficient documentation

## 2011-02-22 DIAGNOSIS — J45909 Unspecified asthma, uncomplicated: Secondary | ICD-10-CM | POA: Insufficient documentation

## 2011-02-22 DIAGNOSIS — Z79899 Other long term (current) drug therapy: Secondary | ICD-10-CM | POA: Insufficient documentation

## 2011-02-22 DIAGNOSIS — L02219 Cutaneous abscess of trunk, unspecified: Secondary | ICD-10-CM | POA: Insufficient documentation

## 2011-02-22 DIAGNOSIS — N949 Unspecified condition associated with female genital organs and menstrual cycle: Secondary | ICD-10-CM | POA: Insufficient documentation

## 2011-02-22 DIAGNOSIS — M069 Rheumatoid arthritis, unspecified: Secondary | ICD-10-CM | POA: Insufficient documentation

## 2011-02-22 LAB — RAPID URINE DRUG SCREEN, HOSP PERFORMED: Opiates: NOT DETECTED

## 2011-02-22 MED ORDER — OXYCODONE-ACETAMINOPHEN 5-325 MG PO TABS
1.0000 | ORAL_TABLET | Freq: Once | ORAL | Status: AC
  Administered 2011-02-22: 1 via ORAL
  Filled 2011-02-22: qty 1

## 2011-02-22 MED ORDER — DICLOXACILLIN SODIUM 500 MG PO CAPS: 500.0000 mg | ORAL_CAPSULE | Freq: Four times a day (QID) | ORAL | Status: DC

## 2011-02-22 MED ORDER — OXYCODONE-ACETAMINOPHEN 5-325 MG PO TABS: 2.0000 | ORAL_TABLET | ORAL | Status: AC | PRN

## 2011-02-22 MED ORDER — CEPHALEXIN 500 MG PO CAPS: 500.0000 mg | ORAL_CAPSULE | Freq: Four times a day (QID) | ORAL | Status: AC

## 2011-02-22 NOTE — Progress Notes (Signed)
DR DOVE  AT BEDSIDE

## 2011-02-22 NOTE — Progress Notes (Signed)
Pt uncooperative for preocedure - CRYING.Marland Kitchen

## 2011-02-22 NOTE — Telephone Encounter (Signed)
Spoke w/pt- informed of Dr. Mont Dutton message.  Pt voiced understanding.  Pt stated that now she is having a different problem and went to MAU yesterday for evaluation. She has swelling of the vagina and was told that she had cellulitis. She was given antibiotic Rx's which she has not filled yet- will get today. She was also told to follow up in our clinic on Monday 8/20 and is requesting appt. I told pt that I did not think we had any availability for that day. I transferred her call to [facility] for appt. Scheduling.

## 2011-02-22 NOTE — ED Provider Notes (Signed)
History     No chief complaint on file.  HPI 35 y.o. female with c/o "pain in vagina" and painful lump in left axilla. Vaginal pain started yesterday, worse tonight, lump under arm became painful tonight. No discharge or bleeding.    Past Medical History  Diagnosis Date  . Anemia   . Asthma   . Urinary tract infection   . Chronic low back pain   . Endometriosis     Past Surgical History  Procedure Date  . Dilation and curettage of uterus   . Cholecystectomy, laparoscopic   . Umbilical hernia repair   . Cesarean section   . Back surgery     Family History  Problem Relation Age of Onset  . Hypertension Mother   . Cancer Maternal Aunt   . Cancer Maternal Uncle   . Diabetes Maternal Grandmother     History  Substance Use Topics  . Smoking status: Former Smoker -- 2 years    Types: Cigars    Quit date: 06/07/2008  . Smokeless tobacco: Never Used  . Alcohol Use: 0.0 oz/week    0 Glasses of wine per week    Allergies:  Allergies  Allergen Reactions  . Morphine And Related Anaphylaxis  . Aspirin Itching  . Penicillins Itching  . Vicodin (Hydrocodone-Acetaminophen) Itching    Prescriptions prior to admission  Medication Sig Dispense Refill  . CVS IBUPROFEN IB PO Take 1 tablet by mouth 4 (four) times daily as needed.        . traMADol (ULTRAM) 50 MG tablet Take 50 mg by mouth 2 (two) times daily.          Review of Systems  Constitutional: Negative.   Respiratory: Negative.   Cardiovascular: Negative.   Gastrointestinal: Negative for nausea, vomiting, abdominal pain, diarrhea and constipation.  Genitourinary: Negative for dysuria, urgency, frequency, hematuria and flank pain.       Negative for vaginal bleeding, vaginal discharge, positive for pain  Musculoskeletal: Negative.   Neurological: Negative.   Psychiatric/Behavioral: Negative.     Physical Exam   Blood pressure 119/81, pulse 91, temperature 98.8 F (37.1 C), temperature source Oral, resp.  rate 18, height 5\' 4"  (1.626 m), weight 65.431 kg (144 lb 4 oz), last menstrual period 02/15/2011.  Physical Exam  Nursing note and vitals reviewed. Constitutional: She appears well-developed and well-nourished. She appears distressed.  Cardiovascular: Normal rate.   Respiratory: Effort normal.    Genitourinary:        MAU Course  Procedures  Dr. Marice Potter consulted for eval/I&D of labial abcess. Pt refused procedure d/t discomfort/anxiety.   Assessment and Plan  Labial abcess - D/C home with abx and f/u in GYN clinic per Dr. Ellin Saba orders [facility] 02/22/2011, 1:29 AM

## 2011-02-22 NOTE — Consult Note (Signed)
I explained the option of incision and drainage of the right vulvar abscess.  Consents were signed.  Ms. Villalona was extremely labile and would not stay still...she was continually getting up from the lithotomy position. I explained several times that I would be unable to do the procedure if she was moving.  I eventuallly declined to do the I & D and agreed to treat her with antibiotics and narcotics.  She will follow up in the gyn clinic.

## 2011-03-06 ENCOUNTER — Ambulatory Visit (INDEPENDENT_AMBULATORY_CARE_PROVIDER_SITE_OTHER): Payer: Medicaid Other | Admitting: Obstetrics & Gynecology

## 2011-03-06 ENCOUNTER — Encounter: Payer: Self-pay | Admitting: Obstetrics & Gynecology

## 2011-03-06 VITALS — BP 117/83 | HR 111 | Temp 100.0°F | Ht 66.0 in | Wt 142.3 lb

## 2011-03-06 DIAGNOSIS — N907 Vulvar cyst: Secondary | ICD-10-CM

## 2011-03-06 DIAGNOSIS — N9089 Other specified noninflammatory disorders of vulva and perineum: Secondary | ICD-10-CM

## 2011-03-06 NOTE — Progress Notes (Signed)
  Subjective:    Patient ID: Megan Chandler, female    DOB: March 05, 1976, 35 y.o.   MRN: [id number]  HPI Ms. Rothrock has a history of cysts of her groin, axilla, vulva.  I saw her in the MAU about 10 days ago.  Initially, she wanted an I&D done then, but after 30 minutes of trying to get her to stay still to do the procedure, I gave up on that course of treatment and gave her antibiotics. She says the cyst has improved greatly.   Review of Systems     Objective:   Physical Exam   Marked reduction of size of tender left labial cyst     Assessment & Plan:  I again offered her an I&D, but she has decided that she can't tolerate staying still today.  I have recommended that when she thinks she can hold still she can come back.

## 2011-03-27 ENCOUNTER — Ambulatory Visit: Payer: Medicaid Other | Admitting: Physician Assistant

## 2011-04-04 LAB — URINE MICROSCOPIC-ADD ON

## 2011-04-04 LAB — URINALYSIS, ROUTINE W REFLEX MICROSCOPIC
Glucose, UA: NEGATIVE
Glucose, UA: NEGATIVE
Ketones, ur: 15 — AB
Nitrite: POSITIVE — AB
Protein, ur: >300 — AB
Specific Gravity, Urine: 1.024
Specific Gravity, Urine: 1.034 — ABNORMAL HIGH
Urobilinogen, UA: 0.2
Urobilinogen, UA: 1
pH: 5.5

## 2011-04-04 LAB — CBC
HCT: 33 — ABNORMAL LOW
Hemoglobin: 10.8 — ABNORMAL LOW
MCHC: 32.6
MCV: 84.9
RBC: 3.89
RDW: 17.2 — ABNORMAL HIGH

## 2011-04-04 LAB — PREGNANCY, URINE: Preg Test, Ur: NEGATIVE

## 2011-04-04 LAB — GC/CHLAMYDIA PROBE AMP, GENITAL
Chlamydia, DNA Probe: NEGATIVE
GC Probe Amp, Genital: NEGATIVE

## 2011-04-12 LAB — URINE CULTURE

## 2011-04-12 LAB — DIFFERENTIAL
Basophils Absolute: 0.1 10*3/uL (ref 0.0–0.1)
Basophils Relative: 0 % (ref 0–1)
Basophils Relative: 1 % (ref 0–1)
Lymphocytes Relative: 20 % (ref 12–46)
Lymphocytes Relative: 3 % — ABNORMAL LOW (ref 12–46)
Lymphs Abs: 0.6 10*3/uL — ABNORMAL LOW (ref 0.7–4.0)
Monocytes Relative: 2 % — ABNORMAL LOW (ref 3–12)
Monocytes Relative: 9 % (ref 3–12)
Neutro Abs: 17 10*3/uL — ABNORMAL HIGH (ref 1.7–7.7)
Neutro Abs: 7.5 10*3/uL (ref 1.7–7.7)
Neutrophils Relative %: 69 % (ref 43–77)
Neutrophils Relative %: 94 % — ABNORMAL HIGH (ref 43–77)

## 2011-04-12 LAB — URINALYSIS, ROUTINE W REFLEX MICROSCOPIC
Bilirubin Urine: NEGATIVE
Glucose, UA: NEGATIVE mg/dL
Hgb urine dipstick: NEGATIVE
Ketones, ur: NEGATIVE mg/dL
Protein, ur: NEGATIVE mg/dL
Urobilinogen, UA: 1 mg/dL (ref 0.0–1.0)

## 2011-04-12 LAB — CBC
HCT: 28.7 % — ABNORMAL LOW (ref 36.0–46.0)
Hemoglobin: 10.2 g/dL — ABNORMAL LOW (ref 12.0–15.0)
Hemoglobin: 9 g/dL — ABNORMAL LOW (ref 12.0–15.0)
MCHC: 32 g/dL (ref 30.0–36.0)
MCV: 82.1 fL (ref 78.0–100.0)
RBC: 3.83 MIL/uL — ABNORMAL LOW (ref 3.87–5.11)
RBC: 3.92 MIL/uL (ref 3.87–5.11)
RDW: 17.6 % — ABNORMAL HIGH (ref 11.5–15.5)
WBC: 18.1 10*3/uL — ABNORMAL HIGH (ref 4.0–10.5)

## 2011-04-12 LAB — POCT I-STAT, CHEM 8
Calcium, Ion: 1.2 mmol/L (ref 1.12–1.32)
Chloride: 103 mEq/L (ref 96–112)
HCT: 34 % — ABNORMAL LOW (ref 36.0–46.0)
Sodium: 139 mEq/L (ref 135–145)
TCO2: 25 mmol/L (ref 0–100)

## 2011-04-12 LAB — BASIC METABOLIC PANEL
BUN: 5 mg/dL — ABNORMAL LOW (ref 6–23)
CO2: 26 mEq/L (ref 19–32)
Calcium: 8.9 mg/dL (ref 8.4–10.5)
Calcium: 8.9 mg/dL (ref 8.4–10.5)
Chloride: 103 mEq/L (ref 96–112)
Creatinine, Ser: 0.77 mg/dL (ref 0.4–1.2)
GFR calc Af Amer: >60 mL/min (ref >60)
GFR calc non Af Amer: >60 mL/min (ref >60)
GFR calc non Af Amer: >60 mL/min (ref >60)
GFR calc non Af Amer: >60 mL/min (ref >60)
Glucose, Bld: 81 mg/dL (ref 70–99)
Glucose, Bld: 84 mg/dL (ref 70–99)
Potassium: 3.4 mEq/L — ABNORMAL LOW (ref 3.5–5.1)
Sodium: 134 mEq/L — ABNORMAL LOW (ref 135–145)
Sodium: 136 mEq/L (ref 135–145)
Sodium: 136 mEq/L (ref 135–145)

## 2011-04-12 LAB — URINE MICROSCOPIC-ADD ON

## 2011-04-12 LAB — PROTIME-INR: Prothrombin Time: 13.8 seconds (ref 11.6–15.2)

## 2011-04-12 LAB — POCT PREGNANCY, URINE: Preg Test, Ur: NEGATIVE

## 2011-04-21 ENCOUNTER — Emergency Department (HOSPITAL_COMMUNITY)
Admission: EM | Admit: 2011-04-21 | Discharge: 2011-04-21 | Disposition: A | Discharge status: Home / Self Care | Payer: Medicaid Other | Attending: Emergency Medicine | Admitting: Emergency Medicine

## 2011-04-21 DIAGNOSIS — N751 Abscess of Bartholin's gland: Secondary | ICD-10-CM | POA: Insufficient documentation

## 2011-04-21 DIAGNOSIS — M069 Rheumatoid arthritis, unspecified: Secondary | ICD-10-CM | POA: Insufficient documentation

## 2011-04-21 DIAGNOSIS — J45909 Unspecified asthma, uncomplicated: Secondary | ICD-10-CM | POA: Insufficient documentation

## 2011-04-21 DIAGNOSIS — N949 Unspecified condition associated with female genital organs and menstrual cycle: Secondary | ICD-10-CM | POA: Insufficient documentation

## 2011-04-21 DIAGNOSIS — G8929 Other chronic pain: Secondary | ICD-10-CM | POA: Insufficient documentation

## 2011-05-03 ENCOUNTER — Encounter (HOSPITAL_COMMUNITY): Payer: Self-pay | Admitting: *Deleted

## 2011-05-03 ENCOUNTER — Inpatient Hospital Stay (HOSPITAL_COMMUNITY)
Admission: AD | Admit: 2011-05-03 | Discharge: 2011-05-03 | Disposition: A | Discharge status: Home / Self Care | Payer: Medicaid Other | Source: Ambulatory Visit | Attending: Obstetrics & Gynecology | Admitting: Obstetrics & Gynecology

## 2011-05-03 DIAGNOSIS — N921 Excessive and frequent menstruation with irregular cycle: Secondary | ICD-10-CM

## 2011-05-03 DIAGNOSIS — N764 Abscess of vulva: Secondary | ICD-10-CM | POA: Insufficient documentation

## 2011-05-03 LAB — CBC
Hemoglobin: 10.6 g/dL — ABNORMAL LOW (ref 12.0–15.0)
MCH: 25.5 pg — ABNORMAL LOW (ref 26.0–34.0)
MCHC: 31.2 g/dL (ref 30.0–36.0)

## 2011-05-03 MED ORDER — OXYCODONE-ACETAMINOPHEN 5-325 MG PO TABS: 2.0000 | ORAL_TABLET | Freq: Four times a day (QID) | ORAL | Status: AC | PRN

## 2011-05-03 MED ORDER — LORAZEPAM 1 MG PO TABS
2.0000 mg | ORAL_TABLET | Freq: Once | ORAL | Status: AC
  Administered 2011-05-03: 2 mg via ORAL
  Filled 2011-05-03: qty 2

## 2011-05-03 MED ORDER — HALOPERIDOL 2 MG PO TABS
2.0000 mg | ORAL_TABLET | Freq: Once | ORAL | Status: AC
  Administered 2011-05-03: 2 mg via ORAL
  Filled 2011-05-03: qty 1

## 2011-05-03 MED ORDER — OXYCODONE-ACETAMINOPHEN 5-325 MG PO TABS
2.0000 | ORAL_TABLET | Freq: Once | ORAL | Status: AC
  Administered 2011-05-03: 2 via ORAL
  Filled 2011-05-03: qty 2

## 2011-05-03 MED ORDER — LIDOCAINE-PRILOCAINE 2.5-2.5 % EX CREA
TOPICAL_CREAM | Freq: Once | CUTANEOUS | Status: AC
  Administered 2011-05-03: 14:00:00 via TOPICAL
  Filled 2011-05-03: qty 5

## 2011-05-03 MED ORDER — LIDOCAINE HCL 1 % IJ SOLN
10.0000 mL | Freq: Once | INTRAMUSCULAR | Status: DC
  Filled 2011-05-03: qty 10

## 2011-05-03 MED ORDER — CLINDAMYCIN HCL 300 MG PO CAPS: 300.0000 mg | ORAL_CAPSULE | Freq: Three times a day (TID) | ORAL | Status: AC

## 2011-05-03 MED ORDER — IBUPROFEN 600 MG PO TABS: 600.0000 mg | ORAL_TABLET | Freq: Four times a day (QID) | ORAL | Status: AC | PRN

## 2011-05-03 NOTE — ED Notes (Signed)
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

## 2011-05-03 NOTE — ED Notes (Signed)
Peri care following I&D of right labial abscess.  Peri pad in place.  Minimal drainage, spec obtained to send for wound culture.  Comfort measures and follow up care.  Reinforced  Use of sitz baths and antibiotics.

## 2011-05-03 NOTE — Progress Notes (Signed)
Has been doing sitz baths 3 times a day

## 2011-05-03 NOTE — ED Notes (Signed)
Clarified - has taken hydrocodone for pain without a problem.

## 2011-05-03 NOTE — ED Notes (Signed)
TIME out completed. Witnessed by Renne Crigler NT/NS and  Ivonne Andrew CNM.  Pt verbalized plan.

## 2011-05-03 NOTE — Progress Notes (Signed)
Wk ago Megan Chandler was at [facility]- they put her to sleep, cut it open and put in a ward catheter, a few days later it fell out.  Initially the swelling went down,  But last night it started swelling real bad and was worse this morning.  (pt arrived by EMS)

## 2011-05-03 NOTE — ED Notes (Signed)
Smith at bedside assessing "numbness". Pt calmer - once again reviewed options with pt and what to expect

## 2011-05-03 NOTE — ED Provider Notes (Signed)
History   The patient is a 35 y.o. year old G83P1011 female who presents to MAU by EMS reporting worsening of a labial abcess. She was seen at [facility] 04/21/11 for same (Dx as Bartholin Cyst). I&D performed under IV sedation by general surgeon and Ward Catheter placed. Seen in MAU and [facility] clinic in August for same. Hx of cyst and abscesses in axilla and groin.    CSN: [id number] Arrival date & time: 05/03/2011 10:28 AM   None     Chief Complaint  Patient presents with  . Cyst    (Consider location/radiation/quality/duration/timing/severity/associated sxs/prior treatment) HPI  Past Medical History  Diagnosis Date  . Anemia   . Asthma   . Urinary tract infection   . Chronic low back pain   . Endometriosis     Past Surgical History  Procedure Date  . Dilation and curettage of uterus   . Cholecystectomy, laparoscopic   . Umbilical hernia repair   . Cesarean section   . Back surgery     Family History  Problem Relation Age of Onset  . Hypertension Mother   . Cancer Maternal Aunt   . Cancer Maternal Uncle   . Diabetes Maternal Grandmother     History  Substance Use Topics  . Smoking status: Former Smoker -- 2 years    Types: Cigars  . Smokeless tobacco: Never Used  . Alcohol Use: 0.0 oz/week    OB History    Grav Para Term Preterm Abortions TAB SAB Ect Mult Living   2 1 1  0 1 0 1 0 0 1      Review of Systems: Pos for severe pain at site of abscess since yesterday. Denies fever, chills, drainage from abscess.  Allergies  Morphine and related; Aspirin; Penicillins; and Vicodin (able to take Percocet)  Home Medications  Percocet (#14) completed >1 week ago  BP 137/80  Pulse 74  Temp(Src) 98.2 F (36.8 C) (Oral)  Resp 20  LMP 04/28/2011  Physical Exam General: Severe distress, tearful, restless, unable to have legs together. A&O x 4 GU: 7x 3.5 cm fluctuant mass on right labia majora superior and to the right of bartholin's gland. Mild erythema, no  drainage. Well-healed 0.5 cm incision on interior surface of labia. No cellulitis.      ED Course  Procedures (including critical care time) Incision and Drainage Discussed R/B/I of procedure including bleeding, infection, injury or failure of procedure. Pt very nervous about needle and pain. Requesting IV Versed. Explained not available for MAU procedures. Offered to to schedule I&D in OR under conscious sedation vs D/C home w/ ABX vs expectant managament. Pt declined, wants I&D ASAP, verbalized consent. Questions addressed. Pt generously premedicated w/ Percocet, Ativan, Haldol and Emla cream. Time out performed. Site draped in sterile fashion, cleansed x 3 w/ betadine. ~ 1 ml 1% lidocaine injected into skin. Pt did not tolerate well. Offered to D/C procedure. Pt again declined, requested that CNM proceed. Site numb to light touch, but pt very sensitive to pressure. Declined additional local anesthesia due to severe needle phobia. Requested incision. Incision successful. Drained large amount of purulent discharge. Pt did not tolerate incision well, but received significant pain relief as abscess drained. Specimen collected for cultures, but not transported to lab in proper container. Specimen discarded.   EBL 1 ml  MDM  Assessment: 1. Recurrence of right labia majora abscess, drained well.  Pain significantly improved. 2. ? Hydradenitis Suppurativa  3. Pt extremely needle phobic and intolerant  of pain.  Plan: 1. D/C home 2. F/U as scheduled w/ Dr. Tamela Oddi 05/07/11 or MAU PRN for worsening of Sx 3. Rx Percocet #20 4. Frequent soaks, loose fitting clothing, no intercourse x 1 week 5. Rx Clindamycin 300 mg PO TID x 10 days 7. Suggested general surgeon and/or dermatology referral for further recurrences.   Dorathy Kinsman 05/03/2011 4:24 PM

## 2011-07-08 ENCOUNTER — Other Ambulatory Visit: Payer: Self-pay | Admitting: Orthopedic Surgery

## 2011-07-08 ENCOUNTER — Encounter (HOSPITAL_COMMUNITY): Payer: Self-pay | Admitting: Pharmacy Technician

## 2011-07-16 ENCOUNTER — Ambulatory Visit (HOSPITAL_COMMUNITY)
Admission: RE | Admit: 2011-07-16 | Discharge: 2011-07-16 | Disposition: A | Discharge status: Home / Self Care | Payer: Medicaid Other | Source: Ambulatory Visit | Attending: Orthopedic Surgery | Admitting: Orthopedic Surgery

## 2011-07-16 ENCOUNTER — Encounter (HOSPITAL_COMMUNITY): Payer: Self-pay

## 2011-07-16 ENCOUNTER — Other Ambulatory Visit: Payer: Self-pay

## 2011-07-16 ENCOUNTER — Encounter (HOSPITAL_COMMUNITY)
Admission: RE | Admit: 2011-07-16 | Discharge: 2011-07-16 | Disposition: A | Discharge status: Home / Self Care | Payer: Medicaid Other | Source: Ambulatory Visit | Attending: Orthopedic Surgery | Admitting: Orthopedic Surgery

## 2011-07-16 HISTORY — DX: Cyst of Bartholin's gland: N75.0

## 2011-07-16 HISTORY — DX: Bronchitis, not specified as acute or chronic: J40

## 2011-07-16 HISTORY — DX: Pneumonia, unspecified organism: J18.9

## 2011-07-16 LAB — URINALYSIS, ROUTINE W REFLEX MICROSCOPIC
Glucose, UA: NEGATIVE mg/dL
Hgb urine dipstick: NEGATIVE
Ketones, ur: NEGATIVE mg/dL
Protein, ur: NEGATIVE mg/dL
pH: 6 (ref 5.0–8.0)

## 2011-07-16 LAB — DIFFERENTIAL
Basophils Absolute: 0.1 10*3/uL (ref 0.0–0.1)
Basophils Relative: 0 % (ref 0–1)
Eosinophils Absolute: 0.4 10*3/uL (ref 0.0–0.7)
Monocytes Relative: 4 % (ref 3–12)
Neutro Abs: 6.9 10*3/uL (ref 1.7–7.7)
Neutrophils Relative %: 59 % (ref 43–77)

## 2011-07-16 LAB — CBC
HCT: 34.9 % — ABNORMAL LOW (ref 36.0–46.0)
Hemoglobin: 11.2 g/dL — ABNORMAL LOW (ref 12.0–15.0)
MCV: 82.9 fL (ref 78.0–100.0)
RBC: 4.21 MIL/uL (ref 3.87–5.11)
RDW: 17.5 % — ABNORMAL HIGH (ref 11.5–15.5)
WBC: 11.8 10*3/uL — ABNORMAL HIGH (ref 4.0–10.5)

## 2011-07-16 LAB — URINE MICROSCOPIC-ADD ON

## 2011-07-16 LAB — COMPREHENSIVE METABOLIC PANEL
Alkaline Phosphatase: 77 U/L (ref 39–117)
BUN: 8 mg/dL (ref 6–23)
CO2: 27 mEq/L (ref 19–32)
Chloride: 107 mEq/L (ref 96–112)
Creatinine, Ser: 0.72 mg/dL (ref 0.50–1.10)
GFR calc Af Amer: >90 mL/min (ref >90)
GFR calc non Af Amer: >90 mL/min (ref >90)
Glucose, Bld: 83 mg/dL (ref 70–99)
Potassium: 4.2 mEq/L (ref 3.5–5.1)
Total Bilirubin: 0.1 mg/dL — ABNORMAL LOW (ref 0.3–1.2)

## 2011-07-16 LAB — PROTIME-INR: INR: 0.93 (ref 0.00–1.49)

## 2011-07-16 LAB — SURGICAL PCR SCREEN: Staphylococcus aureus: NEGATIVE

## 2011-07-16 LAB — HCG, SERUM, QUALITATIVE: Preg, Serum: NEGATIVE

## 2011-07-16 LAB — TYPE AND SCREEN: ABO/RH(D): O POS

## 2011-07-16 LAB — ABO/RH: ABO/RH(D): O POS

## 2011-07-16 LAB — APTT: aPTT: 32 seconds (ref 24–37)

## 2011-07-16 MED ORDER — CLINDAMYCIN PHOSPHATE 600 MG/50ML IV SOLN
600.0000 mg | INTRAVENOUS | Status: AC
  Administered 2011-07-17: 600 mg via INTRAVENOUS
  Filled 2011-07-16: qty 50

## 2011-07-16 MED ORDER — POVIDONE-IODINE 7.5 % EX SOLN
Freq: Once | CUTANEOUS | Status: DC
  Filled 2011-07-16: qty 118

## 2011-07-16 NOTE — Progress Notes (Addendum)
Pt states that [facility] on [address] called her the other day to see if she was going to pick up her B/P pill;she didn't know that one had been called in or even by which doctor.

## 2011-07-16 NOTE — Consult Note (Signed)
Anesthesia:  Patient is a 36 year old female scheduled for a left sided transforaminal L3-4 interbody fusion on 07/17/11.  Her PAT appointment was today.  Her history includes anemia, asthma/bronchitis, history of PNA, chronic LBP, endometriosis, Bartholin's cyst, headaches, cigar and marijuana use.  She says a Pharmacist recently called her re: picking up an antihypertensive medication, but she does not recall ever being told she was hypertensive or being prescribed a medication for HTN.  The Pharmacist would not give her any additional details.  Her BP today was WNL at 115/72.  She is only on vitamins and pain medication.  Labs were reviewed and acceptable from an Anesthesia standpoint.  H/H are 11.2/34.9.  A T&S is in process.  Her CXR report is still pending.  Her EKG showed NSR, cannot rule out anterior infarct (age undetermined).  She did not report any CV symptoms at her PAT appointment.  She does not have any known history of CAD or DM.  Clinical correlation on the day of surgery. If asymptomatic and CXR results okay, then anticipate that she can proceed.

## 2011-07-16 NOTE — Pre-Procedure Instructions (Signed)
20 BEMNET TROVATO  07/16/2011   Your procedure is scheduled on:  Wed,Jan 9 @ 0830  Report to [facility] at 0630 AM.  Call this number if you have problems the morning of surgery: [phone]   Remember:   Do not eat food:After Midnight.  May have clear liquids: up to 4 Hours before arrival.(until 2:30 am)  Clear liquids include soda, tea, black coffee, apple or grape juice, broth.  Take these medicines the morning of surgery with A SIP OF WATER: Tramadol(if needed)   Do not wear jewelry, make-up or nail polish.  Do not wear lotions, powders, or perfumes. You may wear deodorant.  Do not shave 48 hours prior to surgery.  Do not bring valuables to the hospital.  Contacts, dentures or bridgework may not be worn into surgery.  Leave suitcase in the car. After surgery it may be brought to your room.  For patients admitted to the hospital, checkout time is 11:00 AM the day of discharge.   Patients discharged the day of surgery will not be allowed to drive home.  Name and phone number of your driver:   Special Instructions: CHG Shower Use Special Wash: 1/2 bottle night before surgery and 1/2 bottle morning of surgery.   Please read over the following fact sheets that you were given: Pain Booklet, Coughing and Deep Breathing, Blood Transfusion Information, MRSA Information and Surgical Site Infection Prevention

## 2011-07-16 NOTE — Progress Notes (Signed)
Pt doesn't have a cardiologist;has never had an echo/stress/heart cath

## 2011-07-17 ENCOUNTER — Ambulatory Visit (HOSPITAL_COMMUNITY): Payer: Medicaid Other | Admitting: Vascular Surgery

## 2011-07-17 ENCOUNTER — Encounter (HOSPITAL_COMMUNITY): Payer: Self-pay | Admitting: Vascular Surgery

## 2011-07-17 ENCOUNTER — Encounter (HOSPITAL_COMMUNITY): Payer: Self-pay | Admitting: *Deleted

## 2011-07-17 ENCOUNTER — Ambulatory Visit (HOSPITAL_COMMUNITY): Payer: Medicaid Other

## 2011-07-17 ENCOUNTER — Inpatient Hospital Stay (HOSPITAL_COMMUNITY)
Admission: RE | Admit: 2011-07-17 | Discharge: 2011-07-20 | DRG: 460 | Disposition: A | Discharge status: Home / Self Care | Payer: Medicaid Other | Source: Ambulatory Visit | Attending: Orthopedic Surgery | Admitting: Orthopedic Surgery

## 2011-07-17 ENCOUNTER — Encounter (HOSPITAL_COMMUNITY)
Admission: RE | Disposition: A | Discharge status: Home / Self Care | Payer: Self-pay | Source: Ambulatory Visit | Attending: Orthopedic Surgery

## 2011-07-17 DIAGNOSIS — M5126 Other intervertebral disc displacement, lumbar region: Principal | ICD-10-CM | POA: Diagnosis present

## 2011-07-17 DIAGNOSIS — M129 Arthropathy, unspecified: Secondary | ICD-10-CM | POA: Diagnosis present

## 2011-07-17 DIAGNOSIS — J45909 Unspecified asthma, uncomplicated: Secondary | ICD-10-CM | POA: Diagnosis present

## 2011-07-17 DIAGNOSIS — F1210 Cannabis abuse, uncomplicated: Secondary | ICD-10-CM | POA: Diagnosis present

## 2011-07-17 DIAGNOSIS — N764 Abscess of vulva: Secondary | ICD-10-CM

## 2011-07-17 DIAGNOSIS — Z01818 Encounter for other preprocedural examination: Secondary | ICD-10-CM

## 2011-07-17 DIAGNOSIS — Z01810 Encounter for preprocedural cardiovascular examination: Secondary | ICD-10-CM

## 2011-07-17 DIAGNOSIS — F17200 Nicotine dependence, unspecified, uncomplicated: Secondary | ICD-10-CM | POA: Diagnosis present

## 2011-07-17 DIAGNOSIS — I10 Essential (primary) hypertension: Secondary | ICD-10-CM | POA: Diagnosis present

## 2011-07-17 DIAGNOSIS — Z01812 Encounter for preprocedural laboratory examination: Secondary | ICD-10-CM

## 2011-07-17 SURGERY — POSTERIOR LUMBAR FUSION 1 LEVEL
Anesthesia: General | Site: Back | Laterality: Right | Wound class: Clean

## 2011-07-17 MED ORDER — SUCCINYLCHOLINE CHLORIDE 20 MG/ML IJ SOLN
INTRAMUSCULAR | Status: DC | PRN
  Administered 2011-07-17: 100 mg via INTRAVENOUS

## 2011-07-17 MED ORDER — PHENOL 1.4 % MT LIQD
1.0000 | OROMUCOSAL | Status: DC | PRN
Start: 2011-07-17 — End: 2011-07-20
  Filled 2011-07-17: qty 177

## 2011-07-17 MED ORDER — DIAZEPAM 5 MG PO TABS
5.0000 mg | ORAL_TABLET | Freq: Four times a day (QID) | ORAL | Status: DC | PRN
  Administered 2011-07-17 – 2011-07-20 (×5): 5 mg via ORAL
  Filled 2011-07-17 (×5): qty 1

## 2011-07-17 MED ORDER — MIDAZOLAM HCL 5 MG/5ML IJ SOLN
INTRAMUSCULAR | Status: DC | PRN
  Administered 2011-07-17: 2 mg via INTRAVENOUS

## 2011-07-17 MED ORDER — ONDANSETRON HCL 4 MG/2ML IJ SOLN
INTRAMUSCULAR | Status: DC | PRN
  Administered 2011-07-17: 4 mg via INTRAVENOUS

## 2011-07-17 MED ORDER — OXYCODONE-ACETAMINOPHEN 5-325 MG PO TABS
1.0000 | ORAL_TABLET | ORAL | Status: DC | PRN
  Administered 2011-07-18: 2 via ORAL
  Administered 2011-07-18: 1 via ORAL
  Administered 2011-07-19 (×2): 2 via ORAL
  Administered 2011-07-19: 1 via ORAL
  Administered 2011-07-19 – 2011-07-20 (×3): 2 via ORAL
  Filled 2011-07-17 (×8): qty 2
  Filled 2011-07-17 (×2): qty 1

## 2011-07-17 MED ORDER — ADULT MULTIVITAMIN W/MINERALS CH
1.0000 | ORAL_TABLET | Freq: Every day | ORAL | Status: DC
  Administered 2011-07-17 – 2011-07-19 (×3): 1 via ORAL
  Filled 2011-07-17 (×4): qty 1

## 2011-07-17 MED ORDER — BUPIVACAINE-EPINEPHRINE 0.25% -1:200000 IJ SOLN
INTRAMUSCULAR | Status: DC | PRN
  Administered 2011-07-17: 5 mL

## 2011-07-17 MED ORDER — PROPOFOL 10 MG/ML IV EMUL
INTRAVENOUS | Status: DC | PRN
  Administered 2011-07-17: 200 mg via INTRAVENOUS

## 2011-07-17 MED ORDER — POTASSIUM CHLORIDE IN NACL 20-0.9 MEQ/L-% IV SOLN
INTRAVENOUS | Status: DC
  Administered 2011-07-17 – 2011-07-18 (×2): via INTRAVENOUS
  Filled 2011-07-17 (×7): qty 1000

## 2011-07-17 MED ORDER — HYDROXYZINE HCL 50 MG/ML IM SOLN
50.0000 mg | INTRAMUSCULAR | Status: DC | PRN
  Filled 2011-07-17: qty 1

## 2011-07-17 MED ORDER — 0.9 % SODIUM CHLORIDE (POUR BTL) OPTIME
TOPICAL | Status: DC | PRN
  Administered 2011-07-17: 1000 mL

## 2011-07-17 MED ORDER — NALOXONE HCL 0.4 MG/ML IJ SOLN
0.4000 mg | INTRAMUSCULAR | Status: DC | PRN
  Filled 2011-07-17: qty 1

## 2011-07-17 MED ORDER — SODIUM CHLORIDE 0.9 % IJ SOLN: 3.0000 mL | INTRAMUSCULAR | Status: DC | PRN

## 2011-07-17 MED ORDER — DIPHENHYDRAMINE HCL 12.5 MG/5ML PO ELIX
12.5000 mg | ORAL_SOLUTION | Freq: Four times a day (QID) | ORAL | Status: DC | PRN
  Filled 2011-07-17: qty 5

## 2011-07-17 MED ORDER — SODIUM CHLORIDE 0.9 % IJ SOLN
3.0000 mL | Freq: Two times a day (BID) | INTRAMUSCULAR | Status: DC
  Administered 2011-07-17 – 2011-07-19 (×4): 3 mL via INTRAVENOUS

## 2011-07-17 MED ORDER — GLYCOPYRROLATE 0.2 MG/ML IJ SOLN
INTRAMUSCULAR | Status: DC | PRN
  Administered 2011-07-17: .4 mg via INTRAVENOUS

## 2011-07-17 MED ORDER — VANCOMYCIN HCL 1000 MG IV SOLR
750.0000 mg | Freq: Once | INTRAVENOUS | Status: AC
  Administered 2011-07-17: 750 mg via INTRAVENOUS
  Filled 2011-07-17: qty 750

## 2011-07-17 MED ORDER — ALBUTEROL SULFATE (5 MG/ML) 0.5% IN NEBU
INHALATION_SOLUTION | RESPIRATORY_TRACT | Status: AC
  Administered 2011-07-17: 2.5 mg via RESPIRATORY_TRACT
  Filled 2011-07-17: qty 0.5

## 2011-07-17 MED ORDER — LACTATED RINGERS IV SOLN
INTRAVENOUS | Status: DC | PRN
  Administered 2011-07-17 (×3): via INTRAVENOUS

## 2011-07-17 MED ORDER — ONDANSETRON HCL 4 MG/2ML IJ SOLN
4.0000 mg | Freq: Four times a day (QID) | INTRAMUSCULAR | Status: DC | PRN
  Filled 2011-07-17: qty 2

## 2011-07-17 MED ORDER — SUFENTANIL CITRATE 50 MCG/ML IV SOLN
INTRAVENOUS | Status: DC | PRN
  Administered 2011-07-17: 15 ug via INTRAVENOUS
  Administered 2011-07-17: 25 ug via INTRAVENOUS
  Administered 2011-07-17: 5 ug via INTRAVENOUS
  Administered 2011-07-17: 10 ug via INTRAVENOUS
  Administered 2011-07-17: 5 ug via INTRAVENOUS
  Administered 2011-07-17: 20 ug via INTRAVENOUS
  Administered 2011-07-17: 10 ug via INTRAVENOUS
  Administered 2011-07-17 (×2): 5 ug via INTRAVENOUS

## 2011-07-17 MED ORDER — ZOLPIDEM TARTRATE 5 MG PO TABS: 5.0000 mg | ORAL_TABLET | Freq: Every evening | ORAL | Status: DC | PRN

## 2011-07-17 MED ORDER — ACETAMINOPHEN 325 MG PO TABS: 650.0000 mg | ORAL_TABLET | ORAL | Status: DC | PRN

## 2011-07-17 MED ORDER — HETASTARCH-ELECTROLYTES 6 % IV SOLN
INTRAVENOUS | Status: DC | PRN
  Administered 2011-07-17: 10:00:00 via INTRAVENOUS

## 2011-07-17 MED ORDER — HYDROMORPHONE 0.3 MG/ML IV SOLN
INTRAVENOUS | Status: DC
  Administered 2011-07-17: 1.19 mg via INTRAVENOUS
  Administered 2011-07-17: 14:00:00 via INTRAVENOUS
  Administered 2011-07-18: 2.19 mg via INTRAVENOUS
  Administered 2011-07-18: 25 mg via INTRAVENOUS
  Filled 2011-07-17: qty 25

## 2011-07-17 MED ORDER — PHENYLEPHRINE HCL 10 MG/ML IJ SOLN
10.0000 mg | INTRAVENOUS | Status: DC | PRN
  Administered 2011-07-17: 35 ug/min via INTRAVENOUS

## 2011-07-17 MED ORDER — HYDROXYZINE HCL 50 MG PO TABS
50.0000 mg | ORAL_TABLET | ORAL | Status: DC | PRN
  Administered 2011-07-18 – 2011-07-19 (×2): 50 mg via ORAL
  Filled 2011-07-17 (×2): qty 1

## 2011-07-17 MED ORDER — LIDOCAINE HCL (CARDIAC) 20 MG/ML IV SOLN
INTRAVENOUS | Status: DC | PRN
  Administered 2011-07-17: 40 mg via INTRAVENOUS

## 2011-07-17 MED ORDER — ACETAMINOPHEN 650 MG RE SUPP: 650.0000 mg | RECTAL | Status: DC | PRN

## 2011-07-17 MED ORDER — NEOSTIGMINE METHYLSULFATE 1 MG/ML IJ SOLN
INTRAMUSCULAR | Status: DC | PRN
  Administered 2011-07-17: 3 mg via INTRAVENOUS

## 2011-07-17 MED ORDER — WHITE PETROLATUM GEL
Status: AC
  Administered 2011-07-17: 18:00:00
  Filled 2011-07-17: qty 5

## 2011-07-17 MED ORDER — SODIUM CHLORIDE 0.9 % IV SOLN
250.0000 mL | INTRAVENOUS | Status: DC
  Administered 2011-07-17: 250 mL via INTRAVENOUS

## 2011-07-17 MED ORDER — THROMBIN 20000 UNITS EX KIT
PACK | CUTANEOUS | Status: DC | PRN
  Administered 2011-07-17: 09:00:00 via TOPICAL

## 2011-07-17 MED ORDER — ONDANSETRON HCL 4 MG/2ML IJ SOLN: 4.0000 mg | Freq: Once | INTRAMUSCULAR | Status: DC | PRN

## 2011-07-17 MED ORDER — DIPHENHYDRAMINE HCL 50 MG/ML IJ SOLN
12.5000 mg | Freq: Four times a day (QID) | INTRAMUSCULAR | Status: DC | PRN
  Administered 2011-07-18 (×2): 12.5 mg via INTRAVENOUS
  Filled 2011-07-17: qty 0.25
  Filled 2011-07-17 (×2): qty 1

## 2011-07-17 MED ORDER — SODIUM CHLORIDE 0.9 % IJ SOLN: 9.0000 mL | INTRAMUSCULAR | Status: DC | PRN

## 2011-07-17 MED ORDER — HYDROMORPHONE HCL PF 1 MG/ML IJ SOLN
0.2500 mg | INTRAMUSCULAR | Status: DC | PRN
  Administered 2011-07-17 (×3): 0.5 mg via INTRAVENOUS

## 2011-07-17 MED ORDER — HYDROMORPHONE HCL PF 1 MG/ML IJ SOLN: 0.5000 mg | INTRAMUSCULAR | Status: DC | PRN

## 2011-07-17 MED ORDER — ROCURONIUM BROMIDE 100 MG/10ML IV SOLN
INTRAVENOUS | Status: DC | PRN
  Administered 2011-07-17: 30 mg via INTRAVENOUS
  Administered 2011-07-17: 20 mg via INTRAVENOUS
  Administered 2011-07-17: 5 mg via INTRAVENOUS

## 2011-07-17 MED ORDER — ALBUMIN HUMAN 5 % IV SOLN
INTRAVENOUS | Status: DC | PRN
  Administered 2011-07-17: 11:00:00 via INTRAVENOUS

## 2011-07-17 MED ORDER — MENTHOL 3 MG MT LOZG: 1.0000 | LOZENGE | OROMUCOSAL | Status: DC | PRN

## 2011-07-17 MED ORDER — ALBUTEROL SULFATE (5 MG/ML) 0.5% IN NEBU: 2.5000 mg | INHALATION_SOLUTION | RESPIRATORY_TRACT | Status: AC

## 2011-07-17 MED ORDER — PROPOFOL 10 MG/ML IV EMUL
INTRAVENOUS | Status: DC | PRN
  Administered 2011-07-17: 25 ug/kg/min via INTRAVENOUS

## 2011-07-17 SURGICAL SUPPLY — 76 items
APL SKNCLS STERI-STRIP NONHPOA (GAUZE/BANDAGES/DRESSINGS) ×1
BENZOIN TINCTURE PRP APPL 2/3 (GAUZE/BANDAGES/DRESSINGS) ×1 IMPLANT
BLADE SURG ROTATE 9660 (MISCELLANEOUS) IMPLANT
BUR ROUND PRECISION 4.0 (BURR) ×2 IMPLANT
CAGE BULLET CONCORDE 9X9X27 (Cage) ×1 IMPLANT
CARTRIDGE OIL MAESTRO DRILL (MISCELLANEOUS) IMPLANT
CLOSURE STERI STRIP 1/2 X4 (GAUZE/BANDAGES/DRESSINGS) ×1 IMPLANT
CLOTH BEACON ORANGE TIMEOUT ST (SAFETY) ×2 IMPLANT
CONT SPEC STER OR (MISCELLANEOUS) ×2 IMPLANT
CORDS BIPOLAR (ELECTRODE) ×2 IMPLANT
COVER SURGICAL LIGHT HANDLE (MISCELLANEOUS) ×2 IMPLANT
DIFFUSER DRILL AIR PNEUMATIC (MISCELLANEOUS) IMPLANT
DRAIN CHANNEL 15F RND FF W/TCR (WOUND CARE) IMPLANT
DRAPE C-ARM 42X72 X-RAY (DRAPES) ×2 IMPLANT
DRAPE INCISE IOBAN 66X45 STRL (DRAPES) ×1 IMPLANT
DRAPE ORTHO SPLIT 77X108 STRL (DRAPES) ×2
DRAPE POUCH INSTRU U-SHP 10X18 (DRAPES) ×2 IMPLANT
DRAPE SURG 17X23 STRL (DRAPES) ×6 IMPLANT
DRAPE SURG ORHT 6 SPLT 77X108 (DRAPES) ×1 IMPLANT
DURAPREP 26ML APPLICATOR (WOUND CARE) ×2 IMPLANT
ELECT BLADE 4.0 EZ CLEAN MEGAD (MISCELLANEOUS) ×2
ELECT CAUTERY BLADE 6.4 (BLADE) ×2 IMPLANT
ELECT REM PT RETURN 9FT ADLT (ELECTROSURGICAL) ×2
ELECTRODE BLDE 4.0 EZ CLN MEGD (MISCELLANEOUS) ×1 IMPLANT
ELECTRODE REM PT RTRN 9FT ADLT (ELECTROSURGICAL) ×1 IMPLANT
EVACUATOR SILICONE 100CC (DRAIN) IMPLANT
GAUZE SPONGE 4X4 16PLY XRAY LF (GAUZE/BANDAGES/DRESSINGS) ×8 IMPLANT
GLOVE BIO SURGEON STRL SZ8 (GLOVE) ×4 IMPLANT
GLOVE BIOGEL PI IND STRL 8 (GLOVE) ×1 IMPLANT
GLOVE BIOGEL PI INDICATOR 8 (GLOVE) ×1
GOWN PREVENTION PLUS XLARGE (GOWN DISPOSABLE) ×2 IMPLANT
GOWN STRL NON-REIN LRG LVL3 (GOWN DISPOSABLE) ×4 IMPLANT
IV CATH 14GX2 1/4 (CATHETERS) ×2 IMPLANT
KIT BASIN OR (CUSTOM PROCEDURE TRAY) ×2 IMPLANT
KIT POSITION SURG JACKSON T1 (MISCELLANEOUS) ×1 IMPLANT
KIT ROOM TURNOVER OR (KITS) ×2 IMPLANT
MARKER SKIN DUAL TIP RULER LAB (MISCELLANEOUS) ×2 IMPLANT
MIX DBX 10CC 35% BONE (Bone Implant) ×1 IMPLANT
NDL HYPO 25GX1X1/2 BEV (NEEDLE) ×1 IMPLANT
NDL SPNL 18GX3.5 QUINCKE PK (NEEDLE) ×2 IMPLANT
NEEDLE BONE MARROW 8GX6 FENEST (NEEDLE) IMPLANT
NEEDLE HYPO 25GX1X1/2 BEV (NEEDLE) ×2 IMPLANT
NEEDLE SPNL 18GX3.5 QUINCKE PK (NEEDLE) ×4 IMPLANT
NS IRRIG 1000ML POUR BTL (IV SOLUTION) ×4 IMPLANT
OIL CARTRIDGE MAESTRO DRILL (MISCELLANEOUS)
PACK LAMINECTOMY ORTHO (CUSTOM PROCEDURE TRAY) ×2 IMPLANT
PACK UNIVERSAL I (CUSTOM PROCEDURE TRAY) ×2 IMPLANT
PAD ARMBOARD 7.5X6 YLW CONV (MISCELLANEOUS) ×4 IMPLANT
PATTIES SURGICAL .5 X1 (DISPOSABLE) ×2 IMPLANT
PATTIES SURGICAL .5X1.5 (GAUZE/BANDAGES/DRESSINGS) ×2 IMPLANT
ROD EXEDIUM PREBENT 5.5 40MM (Rod) ×2 IMPLANT
ROD EXEDIUM PREBENT 5.5X40 (Rod) IMPLANT
SCREW EXPEDIUM POLYAXIAL 6X40 (Screw) ×4 IMPLANT
SCREW SET SINGLE INNER (Screw) ×4 IMPLANT
SPONGE GAUZE 4X4 12PLY (GAUZE/BANDAGES/DRESSINGS) ×2 IMPLANT
SPONGE INTESTINAL PEANUT (DISPOSABLE) ×2 IMPLANT
SPONGE SURGIFOAM ABS GEL 100 (HEMOSTASIS) ×2 IMPLANT
STRIP CLOSURE SKIN 1/2X4 (GAUZE/BANDAGES/DRESSINGS) IMPLANT
SURGIFLO TRUKIT (HEMOSTASIS) IMPLANT
SUT MNCRL AB 3-0 PS2 18 (SUTURE) ×2 IMPLANT
SUT MNCRL AB 4-0 PS2 18 (SUTURE) ×2 IMPLANT
SUT VIC AB 0 CT1 18XCR BRD 8 (SUTURE) ×1 IMPLANT
SUT VIC AB 0 CT1 8-18 (SUTURE) ×2
SUT VIC AB 1 CT1 18XCR BRD 8 (SUTURE) ×2 IMPLANT
SUT VIC AB 1 CT1 8-18 (SUTURE) ×4
SUT VIC AB 2-0 CT2 18 VCP726D (SUTURE) ×2 IMPLANT
SYR 20CC LL (SYRINGE) ×2 IMPLANT
SYR BULB IRRIGATION 50ML (SYRINGE) ×2 IMPLANT
SYR CONTROL 10ML LL (SYRINGE) ×2 IMPLANT
TAPE CLOTH SURG 4X10 WHT LF (GAUZE/BANDAGES/DRESSINGS) ×1 IMPLANT
TOWEL OR 17X24 6PK STRL BLUE (TOWEL DISPOSABLE) ×2 IMPLANT
TOWEL OR 17X26 10 PK STRL BLUE (TOWEL DISPOSABLE) ×2 IMPLANT
TRAY FOLEY CATH 14FR (SET/KITS/TRAYS/PACK) ×2 IMPLANT
WATER STERILE IRR 1000ML POUR (IV SOLUTION) ×1 IMPLANT
YANKAUER SUCT BULB TIP NO VENT (SUCTIONS) ×2 IMPLANT
cancellous chips 1-8mm 40cc (Bone Implant) ×1 IMPLANT

## 2011-07-17 NOTE — H&P (Signed)
PREOPERATIVE H&P  Chief Complaint: left leg pain  HPI: Megan Chandler is a 35 y.o. female who presents with left leg pain  Past Medical History  Diagnosis Date  . Anemia   . Urinary tract infection   . Chronic low back pain     buldging disc  . Endometriosis   . Pneumonia     as a child  . Bronchitis   . Headache     often but no migraines  . Arthritis   . History of bladder infections   . Hypertension     but not on medication  . Bartholin's cyst     hx of  . Asthma    Past Surgical History  Procedure Date  . Dilation and curettage of uterus   . Cholecystectomy, laparoscopic 1998  . Umbilical hernia repair     as a child  . Cesarean section 1996  . Laparoscopy at age 53  . Cervical spine surgery 2009  . Back surgery    History   Social History  . Marital Status: Single    Spouse Name: N/A    Number of Children: N/A  . Years of Education: N/A   Social History Main Topics  . Smoking status: Passive Smoker -- 1 years    Types: Cigars  . Smokeless tobacco: Never Used  . Alcohol Use: No  . Drug Use: Yes    Special: Marijuana     smokes marijuana-last time a week ago  . Sexually Active: Not Currently    Birth Control/ Protection: Inserts     Nuvaring   Other Topics Concern  . None   Social History Narrative  . None   Family History  Problem Relation Age of Onset  . Hypertension Mother   . Cancer Maternal Aunt   . Cancer Maternal Uncle   . Diabetes Maternal Grandmother   . Anesthesia problems Neg Hx   . Hypotension Neg Hx   . Malignant hyperthermia Neg Hx   . Pseudochol deficiency Neg Hx    Allergies  Allergen Reactions  . Morphine And Related Anaphylaxis  . Aspirin Itching  . Penicillins Itching  . Vicodin (Hydrocodone-Acetaminophen) Itching   Prior to Admission medications   Medication Sig Start Date End Date Taking? Authorizing Provider  ibuprofen (ADVIL,MOTRIN) 600 MG tablet Take 600 mg by mouth every 6 (six) hours as needed.      Yes Historical Provider, MD  Multiple Vitamins-Minerals (MULTIVITAMIN WITH MINERALS) tablet Take 1 tablet by mouth daily.     Yes Historical Provider, MD  traMADol (ULTRAM) 50 MG tablet Take 50 mg by mouth every 8 (eight) hours as needed. Maximum dose= 8 tablets per day for pain    Yes Historical Provider, MD     All other systems have been reviewed and were otherwise negative with the exception of those mentioned in the HPI and as above.  Physical Exam: Filed Vitals:   07/17/11 0621  BP: 109/73  Pulse: 70  Temp: 98.2 F (36.8 C)  Resp: 20    General: Alert, no acute distress Cardiovascular: No pedal edema Respiratory: No cyanosis, no use of accessory musculature GI: No organomegaly, abdomen is soft and non-tender Skin: No lesions in the area of chief complaint Neurologic: Sensation intact distally Psychiatric: Patient is competent for consent with normal mood and affect Lymphatic: No axillary or cervical lymphadenopathy  MUSCULOSKELETAL: + SLR on left  Assessment/Plan: radiculopathy Plan for Procedure(s): L3/4 TLIF on left   [name] 07/17/2011 8:26  AM

## 2011-07-17 NOTE — Transfer of Care (Signed)
Immediate Anesthesia Transfer of Care Note  Patient: Megan Chandler  Procedure(s) Performed:  POSTERIOR LUMBAR FUSION 1 LEVEL - Left sided Transforaminal lumbar interbody fusion, lumbar 3-4 with instrumentation.  Patient Location: PACU  Anesthesia Type: General  Level of Consciousness: awake, alert  and oriented  Airway & Oxygen Therapy: Patient Spontanous Breathing and Patient connected to face mask oxygen  Post-op Assessment: Report given to PACU RN, Post -op Vital signs reviewed and stable and Patient moving all extremities  Post vital signs: Reviewed and stable  Complications: No apparent anesthesia complications

## 2011-07-17 NOTE — Anesthesia Preprocedure Evaluation (Addendum)
Anesthesia Evaluation  Patient identified by MRN, date of birth, ID band Patient awake    Reviewed: Allergy & Precautions, H&P , NPO status , Patient's Chart, lab work & pertinent test results  History of Anesthesia Complications Negative for: history of anesthetic complications  Airway Mallampati: I TM Distance: >3 FB Neck ROM: Full    Dental  (+) Teeth Intact   Pulmonary asthma (mild meds prn no attack with in 1 year) , Current Smoker,  clear to auscultation        Cardiovascular hypertension (recent dx.), Regular Normal    Neuro/Psych  Headaches (often Ibuprofen), Negative Psych ROS   GI/Hepatic negative GI ROS, Neg liver ROS,   Endo/Other  Negative Endocrine ROS  Renal/GU negative Renal ROS  Genitourinary negative   Musculoskeletal   Abdominal Normal abdominal exam  (+)   Peds  Hematology negative hematology ROS (+)   Anesthesia Other Findings   Reproductive/Obstetrics                          Anesthesia Physical Anesthesia Plan  ASA: II  Anesthesia Plan: General   Post-op Pain Management:    Induction: Intravenous  Airway Management Planned: Oral ETT  Additional Equipment:   Intra-op Plan:   Post-operative Plan: Extubation in OR  Informed Consent: I have reviewed the patients History and Physical, chart, labs and discussed the procedure including the risks, benefits and alternatives for the proposed anesthesia with the patient or authorized representative who has indicated his/her understanding and acceptance.   Dental advisory given  Plan Discussed with: CRNA, Anesthesiologist and Surgeon  Anesthesia Plan Comments:        Anesthesia Quick Evaluation

## 2011-07-17 NOTE — Progress Notes (Signed)
Dr Katrinka Blazing aware pt has occasional stridor breathing.  O2 sats remain 98%.  Order received to give albuterol neb.  2.5 mg.

## 2011-07-17 NOTE — Progress Notes (Signed)
Pharmacy consult: vancomycin     -wt=64.7kg    -SCr=0.72; CrCl > 100 36 yo female s/p  lumbar infusion for 1 dose of vancomycin 12 hours post op - Will give 750mg  IV vancomycin tonight according to patient weight and renal function  Harland German, Pharm D 07/17/2011 5:22 PM

## 2011-07-17 NOTE — Preoperative (Signed)
Beta Blockers   Reason not to administer Beta Blockers:Not Applicable 

## 2011-07-17 NOTE — Addendum Note (Signed)
Addendum  created 07/17/11 1326 by Rivka Barbara, MD   Modules edited:Orders

## 2011-07-17 NOTE — Anesthesia Postprocedure Evaluation (Signed)
  Anesthesia Post-op Note  Patient: Megan Chandler  Procedure(s) Performed:  POSTERIOR LUMBAR FUSION 1 LEVEL - Left sided Transforaminal lumbar interbody fusion, lumbar 3-4 with instrumentation.  Patient Location: PACU  Anesthesia Type: General  Level of Consciousness: awake, alert , oriented and patient cooperative  Airway and Oxygen Therapy: Patient Spontanous Breathing and Patient connected to nasal cannula oxygen  Post-op Pain: moderate  Post-op Assessment: Post-op Vital signs reviewed, Patient's Cardiovascular Status Stable, Respiratory Function Stable, Patent Airway, No signs of Nausea or vomiting and Pain level controlled  Post-op Vital Signs: stable  Complications: No apparent anesthesia complications

## 2011-07-18 MED ORDER — OXYCODONE HCL 20 MG PO TB12
20.0000 mg | ORAL_TABLET | Freq: Two times a day (BID) | ORAL | Status: DC
  Administered 2011-07-18 – 2011-07-20 (×5): 20 mg via ORAL
  Filled 2011-07-18 (×6): qty 1

## 2011-07-18 NOTE — Progress Notes (Signed)
PT c/o back discomfort, resolved left left leg pain  AVSS NVI Dressing CDI  POD #1 after left L34 TLIF  - d/c pca, start oxycontin - up with PT

## 2011-07-18 NOTE — Progress Notes (Signed)
Physical Therapy Evaluation Patient Details Name: Megan Chandler MRN: [id number] DOB: 12/29/1975 Today's Date: 07/18/2011  Problem List:  Patient Active Problem List  Diagnoses  . Menometrorrhagia    Past Medical History:  Past Medical History  Diagnosis Date  . Anemia   . Urinary tract infection   . Chronic low back pain     buldging disc  . Endometriosis   . Pneumonia     as a child  . Bronchitis   . Headache     often but no migraines  . Arthritis   . History of bladder infections   . Hypertension     but not on medication  . Bartholin's cyst     hx of  . Asthma    Past Surgical History:  Past Surgical History  Procedure Date  . Dilation and curettage of uterus   . Cholecystectomy, laparoscopic 1998  . Umbilical hernia repair     as a child  . Cesarean section 1996  . Laparoscopy at age 43  . Cervical spine surgery 2009  . Back surgery     PT Assessment/Plan/Recommendation PT Assessment Clinical Impression Statement: Pt is 36 y/o female with s/p Lumbar fusion and limited due to overall pain.  Pt will benefit from acute PT services to improve overall mobility to prepare for safe d/c home. PT Recommendation/Assessment: Patient will need skilled PT in the acute care venue PT Problem List: Decreased strength;Decreased activity tolerance;Decreased mobility;Decreased knowledge of use of DME;Pain;Decreased balance;Decreased knowledge of precautions PT Therapy Diagnosis : Difficulty walking;Abnormality of gait;Generalized weakness;Acute pain PT Plan PT Frequency: Min 5X/week PT Recommendation Follow Up Recommendations: Home health PT;Supervision/Assistance - 24 hour Equipment Recommended: 3 in 1 bedside comode;Rolling walker with 5" wheels;Tub/shower bench PT Goals  Acute Rehab PT Goals PT Goal Formulation: With patient Time For Goal Achievement: 7 days Pt will Roll Supine to Left Side: with modified independence PT Goal: Rolling Supine to Left Side -  Progress: Progressing toward goal Pt will go Supine/Side to Sit: with modified independence PT Goal: Supine/Side to Sit - Progress: Progressing toward goal Pt will go Sit to Supine/Side: with modified independence PT Goal: Sit to Supine/Side - Progress: Progressing toward goal Pt will go Sit to Stand: with modified independence PT Goal: Sit to Stand - Progress: Progressing toward goal Pt will go Stand to Sit: with modified independence PT Goal: Stand to Sit - Progress: Progressing toward goal Pt will Ambulate: >150 feet;with modified independence;with least restrictive assistive device PT Goal: Ambulate - Progress: Progressing toward goal Pt will Go Up / Down Stairs: 1-2 stairs;with modified independence;with least restrictive assistive device PT Goal: Up/Down Stairs - Progress: Not met Additional Goals Additional Goal #1: Pt will recall and adhere to 3/3 back precautions. PT Goal: Additional Goal #1 - Progress: Not met  PT Evaluation Precautions/Restrictions  Precautions Precautions: Back Precaution Booklet Issued: Yes (comment) Precaution Comments: Back handout given Required Braces or Orthoses: No Restrictions Weight Bearing Restrictions: No Prior Functioning  Home Living Lives With: Alone Receives Help From: Family Type of Home: House Home Layout: One level Home Access: Stairs to enter Entrance Stairs-Rails: None Entrance Stairs-Number of Steps: 1-2 Bathroom Shower/Tub: Forensic scientist: Standard Bathroom Accessibility: Yes How Accessible: Accessible via walker Home Adaptive Equipment: None Additional Comments: Pt's mother will be available for the next 4 weeks Prior Function Level of Independence: Independent with basic ADLs;Independent with gait;Independent with transfers Able to Take Stairs?: Yes Driving: Yes Vocation: On disability Cognition Cognition Arousal/Alertness: Lethargic  Overall Cognitive Status: Appears within functional limits  for tasks assessed Orientation Level: Oriented X4 Sensation/Coordination Sensation Light Touch: Appears Intact Extremity Assessment RUE Assessment RUE Assessment: Within Functional Limits LUE Assessment LUE Assessment: Within Functional Limits RLE Assessment RLE Assessment: Within Functional Limits LLE Assessment LLE Assessment: Within Functional Limits Mobility (including Balance) Bed Mobility Bed Mobility: No Transfers Sit to Stand: 4: Min assist;With armrests;From chair/3-in-1 Sit to Stand Details (indicate cue type and reason): Max VCs for hand placement and for proper position Ambulation/Gait Ambulation/Gait: Yes Ambulation/Gait Assistance: 4: Min assist Ambulation/Gait Assistance Details (indicate cue type and reason): Assistance to maintain balance and keep upright posture with ambulating.  Pt needs max cues for RW placement and body position within RW. Ambulation Distance (Feet): 10 Feet Assistive device: Rolling walker Gait Pattern: Step-to pattern Stairs: No Wheelchair Mobility Wheelchair Mobility: No    Exercise    End of Session PT - End of Session Equipment Utilized During Treatment: Gait belt Activity Tolerance: Patient tolerated treatment well Patient left: in chair;with call bell in reach Nurse Communication: Mobility status for transfers;Mobility status for ambulation General Behavior During Session: [facility] for tasks performed Cognition: [facility] for tasks performed  Pratham Cassatt 07/18/2011, 10:51 AM [phone]

## 2011-07-18 NOTE — Progress Notes (Signed)
Occupational Therapy Evaluation Patient Details Name: Megan Chandler MRN: [id number] DOB: 1975-09-22 Today's Date: 07/18/2011  Problem List:  Patient Active Problem List  Diagnoses  . Menometrorrhagia    Past Medical History:  Past Medical History  Diagnosis Date  . Anemia   . Urinary tract infection   . Chronic low back pain     buldging disc  . Endometriosis   . Pneumonia     as a child  . Bronchitis   . Headache     often but no migraines  . Arthritis   . History of bladder infections   . Hypertension     but not on medication  . Bartholin's cyst     hx of  . Asthma    Past Surgical History:  Past Surgical History  Procedure Date  . Dilation and curettage of uterus   . Cholecystectomy, laparoscopic 1998  . Umbilical hernia repair     as a child  . Cesarean section 1996  . Laparoscopy at age 68  . Cervical spine surgery 2009  . Back surgery     OT Assessment/Plan/Recommendation OT Assessment Clinical Impression Statement: Pt. is 36 yr old female s/p TLIF currently functional mobility and independence with ADLs limited by pain. Will continue to benefit from further education on techniques for ADLs with following back precautions and transfers to facilitate safe D/C home. OT Recommendation/Assessment: Patient will need skilled OT in the acute care venue OT Problem List: Decreased activity tolerance;Decreased safety awareness;Decreased knowledge of use of DME or AE;Decreased knowledge of precautions Barriers to Discharge: None OT Therapy Diagnosis : Acute pain OT Plan OT Frequency: Min 2X/week OT Treatment/Interventions: Self-care/ADL training;DME and/or AE instruction;Patient/family education OT Recommendation Follow Up Recommendations: Home health OT;Supervision - Intermittent Equipment Recommended: 3 in 1 bedside comode;Rolling walker with 5" wheels;Tub/shower bench Individuals Consulted Consulted and Agree with Results and Recommendations: Patient OT  Goals Acute Rehab OT Goals OT Goal Formulation: With patient Time For Goal Achievement: 7 days ADL Goals Pt Will Perform Grooming: with modified independence;Standing at sink ADL Goal: Grooming - Progress: Progressing toward goals Pt Will Perform Lower Body Bathing: with set-up;with supervision;Sit to stand from chair ADL Goal: Lower Body Bathing - Progress: Not met Pt Will Perform Lower Body Dressing: with modified independence;Sit to stand from chair;with adaptive equipment ADL Goal: Lower Body Dressing - Progress: Not met Pt Will Transfer to Toilet: with modified independence;Ambulation;with DME;3-in-1 ADL Goal: Toilet Transfer - Progress: Progressing toward goals Pt Will Perform Toileting - Hygiene: with modified independence;Sit to stand from 3-in-1/toilet ADL Goal: Toileting - Hygiene - Progress: Progressing toward goals Pt Will Perform Tub/Shower Transfer: Tub transfer;with supervision;Ambulation;with DME;Transfer tub bench ADL Goal: Tub/Shower Transfer - Progress: Not met  OT Evaluation Precautions/Restrictions  Precautions Precautions: Back Required Braces or Orthoses: No Restrictions Weight Bearing Restrictions: No Prior Functioning Home Living Lives With: Alone Type of Home: House Home Layout: One level Home Access: Stairs to enter [employer] of Steps: 1-2 Bathroom Shower/Tub: Forensic scientist: Standard Bathroom Accessibility: Yes How Accessible: Accessible via walker Home Adaptive Equipment: None Additional Comments: Pt's mother will be available for the next 4 weeks Prior Function Level of Independence: Independent with basic ADLs;Independent with gait;Independent with transfers Able to Take Stairs?: Yes Driving: Yes Vocation: On disability ADL ADL Eating/Feeding: Performed;Independent Where Assessed - Eating/Feeding: Chair Grooming: Simulated;Wash/dry face;Set up Grooming Details (indicate cue type and reason): Min  assist for balance Where Assessed - Grooming: Standing at sink Upper Body Bathing: Simulated;Chest;Left  arm;Right arm;Set up Where Assessed - Upper Body Bathing: Sitting, chair Lower Body Bathing: Simulated;Maximal assistance Where Assessed - Lower Body Bathing: Sit to stand from chair Upper Body Dressing: Performed;Minimal assistance Upper Body Dressing Details (indicate cue type and reason): With donning gown Where Assessed - Upper Body Dressing: Sitting, chair Lower Body Dressing: Simulated;Maximal assistance Lower Body Dressing Details (indicate cue type and reason): With reaching down towards legs and attempting to cross foot over opposite knee Where Assessed - Lower Body Dressing: Sit to stand from chair Toilet Transfer: Simulated;Minimal assistance Toilet Transfer Details (indicate cue type and reason): With min verbal cues for hand placement on arm rests and technique for following back precautions Toilet Transfer Method: Ambulating Toileting - Clothing Manipulation: Simulated;Minimal assistance Toileting - Clothing Manipulation Details (indicate cue type and reason): with moving gown Where Assessed - Glass blower/designer Manipulation: Standing Toileting - Hygiene: Simulated;Maximal assistance Toileting - Hygiene Details (indicate cue type and reason): pt. unable to reach backside to complete hygiene Where Assessed - Toileting Hygiene: Sit to stand from 3-in-1 or toilet Tub/Shower Transfer: Not assessed Tub/Shower Transfer Method: Not assessed Equipment Used: Rolling walker Ambulation Related to ADLs: Pt. provided with min assist and mod verbal cues for safety with RW ~10' with RW ADL Comments: Pt. educated on 3/3 back precautions with ADLs to increase pt. safety and techniques for completing ADLs and following precautions Extremity Assessment RUE Assessment RUE Assessment: Within Functional Limits LUE Assessment LUE Assessment: Within Functional Limits Mobility  Bed  Mobility Bed Mobility: No Transfers Transfers: Yes Sit to Stand: 4: Min assist;With armrests;From chair/3-in-1 Sit to Stand Details (indicate cue type and reason): Max verbal cues for hand placement and for upright position    End of Session OT - End of Session Equipment Utilized During Treatment: Gait belt Activity Tolerance: Patient limited by pain Patient left: in chair;with call bell in reach Nurse Communication: Mobility status for transfers General Behavior During Session: [facility] for tasks performed Cognition: [facility] for tasks performed   Aubrianna Orchard, OTR/L Pager [phone] 07/18/2011, 10:12 AM
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
## 2011-07-19 MED ORDER — DIAZEPAM 5 MG PO TABS: 5.0000 mg | ORAL_TABLET | Freq: Four times a day (QID) | ORAL | Status: DC | PRN

## 2011-07-19 MED ORDER — OXYCODONE-ACETAMINOPHEN 5-325 MG PO TABS: 1.0000 | ORAL_TABLET | ORAL | Status: DC | PRN

## 2011-07-19 MED ORDER — BISACODYL 10 MG RE SUPP
10.0000 mg | Freq: Every day | RECTAL | Status: DC | PRN
  Administered 2011-07-19: 10 mg via RECTAL
  Filled 2011-07-19: qty 1

## 2011-07-19 MED ORDER — OXYCODONE HCL 20 MG PO TB12: 20.0000 mg | ORAL_TABLET | Freq: Two times a day (BID) | ORAL | Status: DC

## 2011-07-19 MED FILL — Heparin Sodium (Porcine) Inj 1000 Unit/ML: INTRAMUSCULAR | Qty: 10 | Status: AC

## 2011-07-19 MED FILL — Sodium Chloride IV Soln 0.9%: INTRAVENOUS | Qty: 1000 | Status: AC

## 2011-07-19 MED FILL — Sodium Chloride Irrigation Soln 0.9%: Qty: 3000 | Status: AC

## 2011-07-19 NOTE — Progress Notes (Signed)
Physical Therapy Treatment Patient Details Name: Megan Chandler MRN: [id number] DOB: 1975-08-01 Today's Date: 07/19/2011  PT Assessment/Plan  PT - Assessment/Plan Comments on Treatment Session: Pt continues to be limited due pain and needs extra time to complete task.  Pt and pt's mother requested hospital bed but pt able to perform with supervision.  Recommend pt have RW, 3N1 and tub transfer bench at home.  Will have family to discuss hospital bed with CM. PT Plan: Discharge plan remains appropriate;Frequency remains appropriate PT Frequency: Min 5X/week Follow Up Recommendations: Home health PT;Supervision/Assistance - 24 hour Equipment Recommended: Rolling walker with 5" wheels;3 in 1 bedside comode;Tub/shower bench PT Goals  Acute Rehab PT Goals PT Goal Formulation: With patient Time For Goal Achievement: 7 days Pt will Roll Supine to Left Side: with modified independence PT Goal: Rolling Supine to Left Side - Progress: Progressing toward goal Pt will go Sit to Stand: with modified independence PT Goal: Sit to Stand - Progress: Progressing toward goal Pt will go Stand to Sit: with modified independence PT Goal: Stand to Sit - Progress: Progressing toward goal Pt will Ambulate: >150 feet;with modified independence;with least restrictive assistive device PT Goal: Ambulate - Progress: Progressing toward goal Pt will Go Up / Down Stairs: 1-2 stairs;with modified independence;with least restrictive assistive device PT Goal: Up/Down Stairs - Progress: Progressing toward goal Additional Goals Additional Goal #1: Pt will recall and adhere to 3/3 back precautions. PT Goal: Additional Goal #1 - Progress: Not met  PT Treatment Precautions/Restrictions  Precautions Precautions: Back Precaution Booklet Issued: Yes (comment) Precaution Comments: Back handout given Required Braces or Orthoses: No Restrictions Weight Bearing Restrictions: No Mobility (including Balance) Bed  Mobility Bed Mobility: Yes Right Sidelying to Sit: 5: Supervision Right Sidelying to Sit Details (indicate cue type and reason): Supervision with cues for safety and proper technique to maintain proper position. Transfers Transfers: Yes Sit to Stand: 4: Min assist Sit to Stand Details (indicate cue type and reason): Assistance to complete transfer with max cues for hand and LE placement. Stand to Sit: 4: Min assist Stand to Sit Details: Minguard for safety with max cues for hand placement.  Pt very slow to move and needs extra time complete transfer. Ambulation/Gait Ambulation/Gait: Yes Ambulation/Gait Assistance: 4: Min assist (minguard for safety) Ambulation/Gait Assistance Details (indicate cue type and reason): Minguard for safety with cues for body position within RW.  Pt tends to ambulate with forward posture after several cues to stand upright. Ambulation Distance (Feet): 60 Feet Assistive device: Rolling walker Gait Pattern: Decreased step length - right;Decreased step length - left Gait velocity: decrease gait speed Stairs: Yes Stairs Assistance: 4: Min assist Stairs Assistance Details (indicate cue type and reason): HHA to maintain balance with max cues for step sequence. Stair Management Technique: No rails Number of Stairs: 2   Balance Balance Assessed: Yes Exercise    End of Session PT - End of Session Equipment Utilized During Treatment: Gait belt Activity Tolerance: Patient limited by pain Patient left: in chair;with call bell in reach Nurse Communication: Mobility status for transfers;Mobility status for ambulation General Behavior During Session: Lethargic (Pt is lethargic at times.  C/o pain in standing but lethargi) Cognition: [facility] for tasks performed  Megan Chandler Stage 07/19/2011, 2:46 PM [phone]

## 2011-07-19 NOTE — Progress Notes (Signed)
Occupational Therapy Treatment Patient Details Name: Megan Chandler MRN: [id number] DOB: Aug 27, 1975 Today's Date: 07/19/2011  OT Assessment/Plan OT Assessment/Plan Comments on Treatment Session: Pt making good progress. Mother present for education and able to return demonstrate learned tasks or state that she verbally understands precautions during ADL . If pt is able to negotiate stairs today, feel that she will be able to D/C home tomorrow with [facility] services. OT Plan: Discharge plan remains appropriate;Equipment recommendations need to be updated OT Frequency: Min 2X/week Follow Up Recommendations: Home health OT Equipment Recommended: Rolling walker with 5" wheels;3 in 1 bedside comode;Tub/shower bench OT Goals ADL Goals Pt Will Perform Grooming: with modified independence;Standing at sink ADL Goal: Grooming - Progress: Progressing toward goals Pt Will Perform Lower Body Bathing: with set-up;with supervision;Sit to stand from chair ADL Goal: Lower Body Bathing - Progress: Met Pt Will Perform Lower Body Dressing: with modified independence;Sit to stand from chair;with adaptive equipment ADL Goal: Lower Body Dressing - Progress: Progressing toward goals Pt Will Transfer to Toilet: with modified independence;Ambulation;with DME;3-in-1 ADL Goal: Toilet Transfer - Progress: Progressing toward goals Pt Will Perform Toileting - Hygiene: with modified independence;Sit to stand from 3-in-1/toilet ADL Goal: Toileting - Hygiene - Progress: Progressing toward goals  OT Treatment Precautions/Restrictions  Precautions Precautions: Back Required Braces or Orthoses: No Restrictions Weight Bearing Restrictions: No   ADL ADL Eating/Feeding: Independent Grooming: Performed;Wash/dry hands;Wash/dry face;Teeth care;Supervision/safety Where Assessed - Grooming: Standing at sink Upper Body Bathing: Performed;Chest;Right arm;Left arm;Abdomen;Supervision/safety Where Assessed - Upper Body Bathing:  Standing at sink Lower Body Bathing: Set up;Supervision/safety Where Assessed - Lower Body Bathing: Sit to stand from chair;Standing at sink Upper Body Dressing: Set up Where Assessed - Upper Body Dressing: Sitting, chair Lower Body Dressing: Set up;Supervision/safety Lower Body Dressing Details (indicate cue type and reason):  (with reacher) Where Assessed - Lower Body Dressing: Sit to stand from chair Toilet Transfer: Not assessed Tub/Shower Transfer: Other (comment);Not assessed (Discussed toilet and tubench transfers) Equipment Used: Reacher;Rolling walker Ambulation Related to ADLs: AE. Used reacher for LB dressing. ADL Comments: overall set up with minvc to follow precautions.Educated on availability of  Mobility  Bed Mobility Bed Mobility: Yes (Min A supine - sit) Transfers Transfers: Yes Sit to Stand: 4: Min assist (steady A. after several transfers, pt S.) End of Session OT - End of Session Equipment Utilized During Treatment: Gait belt Activity Tolerance: Patient limited by pain Patient left: in chair;with call bell in reach;with family/visitor present Nurse Communication: Mobility status for transfers General Behavior During Session: [facility] for tasks performed Cognition: [facility] for tasks performed  [facility]  07/19/2011, 9:48 AM [facility], OTR/L  [phone] 07/19/2011

## 2011-07-19 NOTE — Progress Notes (Signed)
PT c/o back discomfort, resolved left left leg pain   AVSS  NVI  Dressing CDI   POD #2 after left L34 TLIF  - continue percocet, valium, oxycontin - up with PT - d/c home today vs. tomorroww

## 2011-07-20 NOTE — Progress Notes (Signed)
Occupational Therapy Treatment Patient Details Name: LASHA ECHEVERRIA MRN: [id number] DOB: 22-Feb-1976 Today's Date: 07/20/2011  OT Assessment/Plan OT Assessment/Plan Comments on Treatment Session: Pt. making good progress and anticipates D/C today. OT Plan: Discharge plan remains appropriate;Equipment recommendations need to be updated OT Frequency: Min 2X/week Follow Up Recommendations: Home health OT Equipment Recommended: Rolling walker with 5" wheels;3 in 1 bedside comode;Tub/shower bench OT Goals Acute Rehab OT Goals OT Goal Formulation: With patient Time For Goal Achievement: 7 days ADL Goals Pt Will Perform Grooming: with modified independence;Standing at sink ADL Goal: Grooming - Progress: Progressing toward goals Pt Will Perform Lower Body Bathing: with set-up;with supervision;Sit to stand from chair ADL Goal: Lower Body Bathing - Progress: Progressing toward goals Pt Will Perform Lower Body Dressing: with modified independence;Sit to stand from chair;with adaptive equipment ADL Goal: Lower Body Dressing - Progress: Progressing toward goals Pt Will Transfer to Toilet: with modified independence;Ambulation;with DME;3-in-1 ADL Goal: Toilet Transfer - Progress: Progressing toward goals Pt Will Perform Toileting - Hygiene: with modified independence;Sit to stand from 3-in-1/toilet ADL Goal: Toileting - Hygiene - Progress: Progressing toward goals Pt Will Perform Tub/Shower Transfer: Tub transfer;with supervision;Ambulation;with DME;Transfer tub bench ADL Goal: Tub/Shower Transfer - Progress: Progressing toward goals  OT Treatment Precautions/Restrictions  Precautions Precautions: Back Required Braces or Orthoses: No   ADL ADL Lower Body Dressing: Set up;Supervision/safety Lower Body Dressing Details (indicate cue type and reason): With crossing foot over opposite knee to complete Where Assessed - Lower Body Dressing: Sit to stand from bed Tub/Shower Transfer:  Performed;Supervision/safety Tub/Shower Transfer Details (indicate cue type and reason): Min verbal cues for transfer technique and safety with hand placement Tub/Shower Transfer Method: Science writer: Counsellor Used: Reacher;Rolling walker Mobility  Bed Mobility Bed Mobility: Yes Right Sidelying to Sit: 5: Supervision Right Sidelying to Sit Details (indicate cue type and reason): Cues for patient to maintain proper back precautions Transfers Transfers: Yes Sit to Stand: 5: Supervision;With upper extremity assist;From bed Sit to Stand Details (indicate cue type and reason): Cues for safe hand placement. Pt pulls up using RW Stand to Sit: 5: Supervision;To bed;With upper extremity assist Stand to Sit Details: Cues for safe hand placement     End of Session OT - End of Session Equipment Utilized During Treatment: Gait belt Activity Tolerance: Patient limited by pain;Patient tolerated treatment well Patient left: in bed;with call bell in reach;with family/visitor present Nurse Communication: Mobility status for transfers General Behavior During Session: [facility] for tasks performed Cognition: [facility] for tasks performed  Leilene Diprima,OTR/L Pager [phone]  07/20/2011, 10:22 AM

## 2011-07-20 NOTE — Progress Notes (Signed)
Physical Therapy Treatment Patient Details Name: Megan Chandler MRN: [id number] DOB: April 03, 1976 Today's Date: 07/20/2011  PT Assessment/Plan  PT - Assessment/Plan Comments on Treatment Session: Pt continues to need increased time for mobility. Pt able to increase ambulation todays session PT Plan: Discharge plan remains appropriate PT Frequency: Min 5X/week Follow Up Recommendations: Home health PT;Supervision/Assistance - 24 hour Equipment Recommended: Rolling walker with 5" wheels;3 in 1 bedside comode;Tub/shower bench PT Goals  Acute Rehab PT Goals PT Goal: Rolling Supine to Left Side - Progress: Progressing toward goal PT Goal: Supine/Side to Sit - Progress: Progressing toward goal PT Goal: Sit to Supine/Side - Progress: Progressing toward goal PT Goal: Sit to Stand - Progress: Progressing toward goal PT Goal: Stand to Sit - Progress: Progressing toward goal PT Goal: Ambulate - Progress: Progressing toward goal PT Goal: Up/Down Stairs - Progress: Progressing toward goal Additional Goals PT Goal: Additional Goal #1 - Progress: Progressing toward goal  PT Treatment Precautions/Restrictions  Precautions Precautions: Back Precaution Booklet Issued: Yes (comment) Precaution Comments: Back handout given Required Braces or Orthoses: No Restrictions Weight Bearing Restrictions: No Mobility (including Balance) Bed Mobility Right Sidelying to Sit: 5: Supervision Right Sidelying to Sit Details (indicate cue type and reason): Cues for patient to maintain proper back precautions Transfers Sit to Stand: 5: Supervision;With upper extremity assist;From bed Sit to Stand Details (indicate cue type and reason): Cues for safe hand placement. Pt pulls up using RW Stand to Sit: 5: Supervision;To bed;With upper extremity assist Stand to Sit Details: Cues for safe hand placement Ambulation/Gait Ambulation/Gait Assistance: 4: Min assist;Other (comment) (MinGuard A) Ambulation/Gait Assistance  Details (indicate cue type and reason): Cues to increase weight through LEs and relax shoulders Ambulation Distance (Feet): 200 Feet Assistive device: Rolling walker Gait Pattern: Step-to pattern;Decreased stride length Gait velocity: decreased gait speed Stairs: Yes Stairs Assistance: 4: Min assist Stairs Assistance Details (indicate cue type and reason): Pt shown how to perform backwards with RW. Cues for sequency and technique Stair Management Technique: Backwards;With walker Number of Stairs: 2     Exercise    End of Session PT - End of Session Equipment Utilized During Treatment: Gait belt Activity Tolerance: Patient tolerated treatment well Patient left: in bed;with call bell in reach;with family/visitor present General Behavior During Session: [facility] for tasks performed Cognition: [facility] for tasks performed  Fredrich Birks 07/20/2011, 10:14 AM 07/20/2011 Fredrich Birks PTA [phone] pager [phone] office

## 2011-07-20 NOTE — Progress Notes (Signed)
   CARE MANAGEMENT NOTE 07/20/2011  Patient:  Megan Chandler, Megan Chandler   Account Number:  [id number]  Date Initiated:  07/20/2011  Documentation initiated by:  Tera Mater  Subjective/Objective Assessment:   DX:  Left leg pain and had same day back surgery     Action/Plan:   discharge planning   Anticipated DC Date:  07/20/2011   Anticipated DC Plan:  HOME W HOME HEALTH SERVICES      DC Planning Services  CM consult      [facility] Choice  DURABLE MEDICAL EQUIPMENT  HOME HEALTH   Choice offered to / List presented to:  C-1 Patient   DME arranged  3-N-1  HOSPITAL BED  TUB BENCH  WALKER - Lavone Nian      DME agency  [facility]     HH arranged  HH-2 PT      [facility] agency  [facility]   Status of service:  Completed, signed off Medicare Important Message given?  NO (If response is "NO", the following Medicare IM given date fields will be blank) Date Medicare IM given:   Date Additional Medicare IM given:    Discharge Disposition:  HOME W HOME HEALTH SERVICES  Per UR Regulation:    Comments:  07/19/10 1200--Spoke with pt. in reference to [facility] services as well as giving pt. list of agencies to choose from.  Pt. chose [facility].  TC to [place] with [facility] to make arrangements for home delievery of hospital bed, 3-N-1, tub bench to [address] [place], [place] [address] and to deliever to room a rolling walker for pt to be discharged home with.  In addition, gave [facility] PT referral for pt.  Order placed in [facility]. Tera Mater, RN, BSN Nurse Case Manager [phone]

## 2011-07-20 NOTE — Progress Notes (Signed)
Subjective: 3 Days Post-Op Procedure(s) (LRB): POSTERIOR LUMBAR FUSION 1 LEVEL (Right)  Activity level:  Pain controlled with po meds. Pt needs home equipment prior to d/c Diet tolerance:  eating Voiding  ok Patient reports pain as 3 on 0-10 scale.    Objective: Vital signs in last 24 hours: Temp:  [97.1 F (36.2 C)-97.8 F (36.6 C)] 97.8 F (36.6 C) (01/12 0553) Pulse Rate:  [92-105] 98  (01/12 0553) Resp:  [17-20] 18  (01/12 0553) BP: (94-120)/(54-76) 119/76 mmHg (01/12 0553) SpO2:  [98 %-99 %] 99 % (01/12 0553)  Intake/Output from previous day: 01/11 0701 - 01/12 0700 In: 360 [P.O.:360] Out: -  Intake/Output this shift:    No results found for this basename: HGB:5 in the last 72 hours No results found for this basename: WBC:2,RBC:2,HCT:2,PLT:2 in the last 72 hours No results found for this basename: NA:2,K:2,CL:2,CO2:2,BUN:2,CREATININE:2,GLUCOSE:2,CALCIUM:2 in the last 72 hours No results found for this basename: LABPT:2,INR:2 in the last 72 hours  PHYSICAL EXAM:  Wound wnl  good n/v to le  abdomen soft with +bs  Assessment/Plan: 3 Days Post-Op Procedure(s) (LRB): POSTERIOR LUMBAR FUSION 1 LEVEL (Right) Advance diet Up with therapy D/C IV fluids  Will try to arrange home equipment for d/c today Lacye Mccarn R 07/20/2011, 7:44 AM

## 2011-07-24 NOTE — Discharge Summary (Signed)
NAMEJUEL, RIPLEY             ACCOUNT NO.:  [id number]  MEDICAL RECORD NO.:  [id number]  LOCATION:  5012                         FACILITY:  [facility]  PHYSICIAN:  Estill Bamberg, MD      DATE OF BIRTH:  01/25/1976  DATE OF ADMISSION:  07/17/2011 DATE OF DISCHARGE:  07/20/2011                              DISCHARGE SUMMARY   ADMISSION DIAGNOSES: 1. Left-sided L3 radiculopathy 2. L3-4 degenerative disk disease.  DISCHARGE DIAGNOSES: 1. Left-sided L3 radiculopathy. 2. L3-4 degenerative disk disease.  ADMITTING PHYSICIAN:  Estill Bamberg, MD.  ADMISSION HISTORY:  Briefly, Ms. Rayle is a 36 year old female who presented to my office with severe and debilitating pain in her left leg.  I did review an MRI which was notable for a foraminal disk herniation at the L3-4 level.  Given the patient's failure of conservative care, we did discuss going forward with the left-sided L3-4 transforaminal lumbar interbody fusion.  The patient was therefore admitted on July 17, 2011 for the procedure noted above.  HOSPITAL COURSE:  On July 17, 2011, the patient was brought to Surgery and underwent the procedure noted above.  The patient tolerated the procedure well and was transferred to recovery in stable condition.  The patient was progressively mobilized throughout her hospital stay.  Her PCA and Foley catheter was discontinued on the morning of postoperative day #1.  She was followed by physical therapy and on the morning of postoperative day #3, it was felt that the patient would be safe for discharge home.  The patient was uneventfully discharged on the morning of postoperative day #3.  DISCHARGE INSTRUCTIONS:  The patient to take Percocet and OxyContin for pain.  She can take Valium for spasms.  She will follow up in my office in approximately 2 weeks after her procedure.  She will adhere to back precautions at all times.     Estill Bamberg, MD     MD/MEDQ  D:  07/24/2011  T:   07/24/2011  Job:  [id number]

## 2011-07-25 ENCOUNTER — Emergency Department (HOSPITAL_COMMUNITY): Payer: Medicaid Other

## 2011-07-25 ENCOUNTER — Encounter (HOSPITAL_COMMUNITY): Payer: Self-pay | Admitting: Emergency Medicine

## 2011-07-25 ENCOUNTER — Emergency Department (HOSPITAL_COMMUNITY)
Admission: EM | Admit: 2011-07-25 | Discharge: 2011-07-26 | Disposition: A | Discharge status: Home / Self Care | Payer: Medicaid Other | Attending: Emergency Medicine | Admitting: Emergency Medicine

## 2011-07-25 DIAGNOSIS — R079 Chest pain, unspecified: Secondary | ICD-10-CM | POA: Insufficient documentation

## 2011-07-25 DIAGNOSIS — J45909 Unspecified asthma, uncomplicated: Secondary | ICD-10-CM | POA: Insufficient documentation

## 2011-07-25 DIAGNOSIS — Z9889 Other specified postprocedural states: Secondary | ICD-10-CM | POA: Insufficient documentation

## 2011-07-25 DIAGNOSIS — Z79899 Other long term (current) drug therapy: Secondary | ICD-10-CM | POA: Insufficient documentation

## 2011-07-25 DIAGNOSIS — M129 Arthropathy, unspecified: Secondary | ICD-10-CM | POA: Insufficient documentation

## 2011-07-25 DIAGNOSIS — M549 Dorsalgia, unspecified: Secondary | ICD-10-CM | POA: Insufficient documentation

## 2011-07-25 DIAGNOSIS — I10 Essential (primary) hypertension: Secondary | ICD-10-CM | POA: Insufficient documentation

## 2011-07-25 DIAGNOSIS — F17200 Nicotine dependence, unspecified, uncomplicated: Secondary | ICD-10-CM | POA: Insufficient documentation

## 2011-07-25 LAB — POCT I-STAT, CHEM 8
Creatinine, Ser: 0.8 mg/dL (ref 0.50–1.10)
Glucose, Bld: 93 mg/dL (ref 70–99)
HCT: 30 % — ABNORMAL LOW (ref 36.0–46.0)
Hemoglobin: 10.2 g/dL — ABNORMAL LOW (ref 12.0–15.0)
TCO2: 25 mmol/L (ref 0–100)

## 2011-07-25 LAB — CBC
Hemoglobin: 8.9 g/dL — ABNORMAL LOW (ref 12.0–15.0)
MCH: 26.8 pg (ref 26.0–34.0)
MCV: 83.1 fL (ref 78.0–100.0)
Platelets: 528 10*3/uL — ABNORMAL HIGH (ref 150–400)
RBC: 3.32 MIL/uL — ABNORMAL LOW (ref 3.87–5.11)
WBC: 11 10*3/uL — ABNORMAL HIGH (ref 4.0–10.5)

## 2011-07-25 MED ORDER — SODIUM CHLORIDE 0.9 % IV SOLN
Freq: Once | INTRAVENOUS | Status: AC
  Administered 2011-07-25: 23:00:00 via INTRAVENOUS

## 2011-07-25 MED ORDER — HYDROMORPHONE HCL PF 1 MG/ML IJ SOLN
0.5000 mg | Freq: Once | INTRAMUSCULAR | Status: AC
  Administered 2011-07-25: 0.5 mg via INTRAVENOUS
  Filled 2011-07-25: qty 1

## 2011-07-25 NOTE — ED Notes (Signed)
Patient transported to CT stable and in no acute distress.  

## 2011-07-25 NOTE — ED Notes (Signed)
Patient with intermittent abdominal pain in right flank/upper right quadrant for the last three days.  No N/V/D, no shortness of breath or chest pain.  Patient did have back surgery and released from hospital on Saturday.

## 2011-07-26 LAB — DIFFERENTIAL
Basophils Absolute: 0 10*3/uL (ref 0.0–0.1)
Basophils Relative: 0 % (ref 0–1)
Eosinophils Absolute: 0.6 10*3/uL (ref 0.0–0.7)
Eosinophils Relative: 5 % (ref 0–5)
Metamyelocytes Relative: 0 %
Monocytes Absolute: 0.2 10*3/uL (ref 0.1–1.0)
Monocytes Relative: 2 % — ABNORMAL LOW (ref 3–12)
Neutro Abs: 7.3 10*3/uL (ref 1.7–7.7)
Neutrophils Relative %: 67 % (ref 43–77)
nRBC: 0 /100 WBC

## 2011-07-26 MED ORDER — OXYCODONE-ACETAMINOPHEN 5-325 MG PO TABS
1.0000 | ORAL_TABLET | Freq: Once | ORAL | Status: AC
  Administered 2011-07-26: 1 via ORAL
  Filled 2011-07-26: qty 1

## 2011-07-26 MED ORDER — IOHEXOL 350 MG/ML SOLN
100.0000 mL | Freq: Once | INTRAVENOUS | Status: AC | PRN
  Administered 2011-07-26: 100 mL via INTRAVENOUS

## 2011-07-26 NOTE — ED Provider Notes (Signed)
History     CSN: [id number]  Arrival date & time 07/25/11  2140   First MD Initiated Contact with Patient 07/25/11 2154      Chief Complaint  Patient presents with  . Abdominal Pain    (Consider location/radiation/quality/duration/timing/severity/associated sxs/prior treatment) Patient is a 36 y.o. female presenting with chest pain. The history is provided by the patient.  Chest Pain The chest pain began 3 - 5 days ago. Chest pain occurs intermittently. The chest pain is worsening. Associated with: She denies shortness of breath or increased pain with breathing. The severity of the pain is moderate. The quality of the pain is described as sharp. Pertinent negatives for primary symptoms include no fever, no shortness of breath and no cough. Primary symptoms comment: She reports for the past 3 days, there has been a recurrent pain in her right lower chest. No cough, fever. No abdominal pain, N, or V.  Associated symptoms comments: She recently had back surgery performed by Dr. Yevette Edwards. Marland Kitchen Procedure history comments: She has a history of cholecystectomy..     Past Medical History  Diagnosis Date  . Anemia   . Urinary tract infection   . Chronic low back pain     buldging disc  . Endometriosis   . Pneumonia     as a child  . Bronchitis   . Headache     often but no migraines  . Arthritis   . History of bladder infections   . Hypertension     but not on medication  . Bartholin's cyst     hx of  . Asthma     Past Surgical History  Procedure Date  . Dilation and curettage of uterus   . Cholecystectomy, laparoscopic 1998  . Umbilical hernia repair     as a child  . Cesarean section 1996  . Laparoscopy at age 81  . Cervical spine surgery 2009  . Back surgery     Family History  Problem Relation Age of Onset  . Hypertension Mother   . Cancer Maternal Aunt   . Cancer Maternal Uncle   . Diabetes Maternal Grandmother   . Anesthesia problems Neg Hx   . Hypotension Neg  Hx   . Malignant hyperthermia Neg Hx   . Pseudochol deficiency Neg Hx     History  Substance Use Topics  . Smoking status: Passive Smoker -- 1 years    Types: Cigars  . Smokeless tobacco: Never Used  . Alcohol Use: No    OB History    Grav Para Term Preterm Abortions TAB SAB Ect Mult Living   2 1 1  0 1 0 1 0 0 1      Review of Systems  Constitutional: Negative for fever and chills.  HENT: Negative.   Respiratory: Negative.  Negative for cough and shortness of breath.   Cardiovascular: Positive for chest pain.  Gastrointestinal: Negative.   Musculoskeletal: Negative.   Skin: Negative.   Neurological: Negative.     Allergies  Morphine and related; Aspirin; Penicillins; and Vicodin  Home Medications   Current Outpatient Rx  Name Route Sig Dispense Refill  . DIAZEPAM 5 MG PO TABS Oral Take 5 mg by mouth every 6 (six) hours as needed. For anxiety    . MULTI-VITAMIN/MINERALS PO TABS Oral Take 1 tablet by mouth daily.      . OXYCODONE HCL ER 20 MG PO TB12 Oral Take 20 mg by mouth every 12 (twelve) hours. For pain    .  OXYCODONE-ACETAMINOPHEN 5-325 MG PO TABS Oral Take 1-2 tablets by mouth every 4 (four) hours as needed. For pain      BP 131/70  Pulse 99  Temp(Src) 98.1 F (36.7 C) (Oral)  Resp 22  SpO2 97%  LMP 07/04/2011  Physical Exam  Constitutional: She appears well-developed and well-nourished.  HENT:  Head: Normocephalic.  Neck: Normal range of motion. Neck supple.  Cardiovascular: Normal rate and regular rhythm.   No murmur heard. Pulmonary/Chest: Effort normal and breath sounds normal. She exhibits no tenderness.  Abdominal: Soft. Bowel sounds are normal. There is no tenderness. There is no rebound and no guarding.  Musculoskeletal: Normal range of motion.  Neurological: She is alert. No cranial nerve deficit.  Skin: Skin is warm and dry. No rash noted.  Psychiatric: She has a normal mood and affect.    ED Course  Procedures (including critical care  time)  Labs Reviewed  CBC - Abnormal; Notable for the following:    WBC 11.0 (*)    RBC 3.32 (*)    Hemoglobin 8.9 (*)    HCT 27.6 (*)    RDW 17.5 (*)    Platelets 528 (*)    All other components within normal limits  DIFFERENTIAL - Abnormal; Notable for the following:    Monocytes Relative 2 (*)    All other components within normal limits  POCT I-STAT, CHEM 8 - Abnormal; Notable for the following:    Hemoglobin 10.2 (*)    HCT 30.0 (*)    All other components within normal limits  I-STAT, CHEM 8   Ct Angio Chest W/cm &/or Wo Cm  07/26/2011  *RADIOLOGY REPORT*  Clinical Data: Back pain, recent lumbar surgery  CT ANGIOGRAPHY CHEST  Technique:  Multidetector CT imaging of the chest using the standard protocol during bolus administration of intravenous contrast. Multiplanar reconstructed images including MIPs were obtained and reviewed to evaluate the vascular anatomy.  Contrast: OMNIPAQUE IOHEXOL 350 MG/ML IV SOLN  Comparison: Chest radiograph dated 07/16/2011  Findings: No evidence of pulmonary embolism.  Dependent atelectasis in the bilateral lower lobes.  No suspicious pulmonary nodules. No pleural effusion or pneumothorax.  Visualized thyroid is unremarkable.  The heart is normal in size.  No pericardial effusion.  No suspicious mediastinal, hilar, or axillary lymphadenopathy.  Visualized upper abdomen is unremarkable.  Cervical spine fixation hardware.  Visualized osseous structures are otherwise within normal limits.  IMPRESSION: No evidence of pulmonary embolism.  No evidence of acute cardiopulmonary disease.  Original Report Authenticated By: Charline Bills, M.D.     No diagnosis found.    MDM  Previous chart reviewed. No PE on CT angio.   Pain improved. Discharge home.      Rodena Medin, PA-C 07/26/11 [phone]

## 2011-07-26 NOTE — ED Notes (Signed)
Mom called and stated Megan Chandler has no way home. Mom states that she will have not money until 6 am in the morning, Also pt just had back surgery this week and can not sit in a chair until someone comes gets her.

## 2011-07-26 NOTE — ED Notes (Signed)
Pt denies chest pain at this time.  Reports she continues to have her chronic back pain.

## 2011-07-30 NOTE — ED Provider Notes (Signed)
Medical screening examination/treatment/procedure(s) were performed by non-physician practitioner and as supervising physician I was immediately available for consultation/collaboration.   Kamalei Roeder A. Dezyrae Kensinger, MD 07/30/11 1452 

## 2011-11-20 ENCOUNTER — Inpatient Hospital Stay (HOSPITAL_COMMUNITY)
Admission: EM | Admit: 2011-11-20 | Discharge: 2011-11-26 | DRG: 176 | Disposition: A | Discharge status: Home / Self Care | Payer: Medicaid Other | Attending: Family Medicine | Admitting: Family Medicine

## 2011-11-20 ENCOUNTER — Encounter (HOSPITAL_COMMUNITY): Payer: Self-pay | Admitting: *Deleted

## 2011-11-20 ENCOUNTER — Emergency Department (HOSPITAL_COMMUNITY): Payer: Medicaid Other

## 2011-11-20 DIAGNOSIS — I2699 Other pulmonary embolism without acute cor pulmonale: Secondary | ICD-10-CM

## 2011-11-20 DIAGNOSIS — D649 Anemia, unspecified: Secondary | ICD-10-CM

## 2011-11-20 DIAGNOSIS — Z885 Allergy status to narcotic agent status: Secondary | ICD-10-CM

## 2011-11-20 DIAGNOSIS — R079 Chest pain, unspecified: Secondary | ICD-10-CM

## 2011-11-20 DIAGNOSIS — F17200 Nicotine dependence, unspecified, uncomplicated: Secondary | ICD-10-CM

## 2011-11-20 DIAGNOSIS — J45909 Unspecified asthma, uncomplicated: Secondary | ICD-10-CM

## 2011-11-20 DIAGNOSIS — N921 Excessive and frequent menstruation with irregular cycle: Secondary | ICD-10-CM

## 2011-11-20 DIAGNOSIS — N938 Other specified abnormal uterine and vaginal bleeding: Secondary | ICD-10-CM | POA: Diagnosis present

## 2011-11-20 DIAGNOSIS — Z880 Allergy status to penicillin: Secondary | ICD-10-CM

## 2011-11-20 DIAGNOSIS — N949 Unspecified condition associated with female genital organs and menstrual cycle: Secondary | ICD-10-CM | POA: Diagnosis present

## 2011-11-20 DIAGNOSIS — N809 Endometriosis, unspecified: Secondary | ICD-10-CM

## 2011-11-20 DIAGNOSIS — F411 Generalized anxiety disorder: Secondary | ICD-10-CM | POA: Diagnosis present

## 2011-11-20 DIAGNOSIS — Z7901 Long term (current) use of anticoagulants: Secondary | ICD-10-CM

## 2011-11-20 DIAGNOSIS — Z79899 Other long term (current) drug therapy: Secondary | ICD-10-CM

## 2011-11-20 DIAGNOSIS — Z886 Allergy status to analgesic agent status: Secondary | ICD-10-CM

## 2011-11-20 DIAGNOSIS — F419 Anxiety disorder, unspecified: Secondary | ICD-10-CM

## 2011-11-20 LAB — CARDIAC PANEL(CRET KIN+CKTOT+MB+TROPI)
CK, MB: 1 ng/mL (ref 0.3–4.0)
Troponin I: <0.3 ng/mL (ref <0.30)

## 2011-11-20 LAB — LACTIC ACID, PLASMA: Lactic Acid, Venous: 0.7 mmol/L (ref 0.5–2.2)

## 2011-11-20 LAB — CBC
HCT: 32.4 % — ABNORMAL LOW (ref 36.0–46.0)
MCV: 73.5 fL — ABNORMAL LOW (ref 78.0–100.0)
Platelets: 565 10*3/uL — ABNORMAL HIGH (ref 150–400)
RBC: 4.41 MIL/uL (ref 3.87–5.11)
RDW: 19.1 % — ABNORMAL HIGH (ref 11.5–15.5)
WBC: 18.2 10*3/uL — ABNORMAL HIGH (ref 4.0–10.5)

## 2011-11-20 LAB — COMPREHENSIVE METABOLIC PANEL
AST: 12 U/L (ref 0–37)
CO2: 21 mEq/L (ref 19–32)
Calcium: 8.9 mg/dL (ref 8.4–10.5)
Creatinine, Ser: 0.63 mg/dL (ref 0.50–1.10)
GFR calc Af Amer: >90 mL/min (ref >90)
GFR calc non Af Amer: >90 mL/min (ref >90)
Glucose, Bld: 89 mg/dL (ref 70–99)

## 2011-11-20 LAB — POCT I-STAT, CHEM 8
BUN: 7 mg/dL (ref 6–23)
Calcium, Ion: 1.19 mmol/L (ref 1.12–1.32)
Chloride: 106 mEq/L (ref 96–112)
Creatinine, Ser: 0.8 mg/dL (ref 0.50–1.10)
Glucose, Bld: 81 mg/dL (ref 70–99)

## 2011-11-20 LAB — AMYLASE: Amylase: 56 U/L (ref 0–105)

## 2011-11-20 LAB — GLUCOSE, CAPILLARY: Glucose-Capillary: 91 mg/dL (ref 70–99)

## 2011-11-20 LAB — PROCALCITONIN: Procalcitonin: <0.1 ng/mL

## 2011-11-20 LAB — PHOSPHORUS: Phosphorus: 3.5 mg/dL (ref 2.3–4.6)

## 2011-11-20 LAB — HOMOCYSTEINE: Homocysteine: 11.6 umol/L (ref 4.0–15.4)

## 2011-11-20 LAB — MRSA PCR SCREENING: MRSA by PCR: NEGATIVE

## 2011-11-20 LAB — RAPID URINE DRUG SCREEN, HOSP PERFORMED: Barbiturates: NOT DETECTED

## 2011-11-20 MED ORDER — KETOROLAC TROMETHAMINE 30 MG/ML IJ SOLN
30.0000 mg | Freq: Once | INTRAMUSCULAR | Status: AC
  Administered 2011-11-20: 30 mg via INTRAVENOUS
  Filled 2011-11-20: qty 1

## 2011-11-20 MED ORDER — HYDROMORPHONE HCL PF 1 MG/ML IJ SOLN
1.0000 mg | Freq: Once | INTRAMUSCULAR | Status: AC
  Administered 2011-11-20: 1 mg via INTRAVENOUS
  Filled 2011-11-20: qty 1

## 2011-11-20 MED ORDER — FENTANYL CITRATE 0.05 MG/ML IJ SOLN
INTRAMUSCULAR | Status: AC
  Filled 2011-11-20: qty 2

## 2011-11-20 MED ORDER — ALBUTEROL SULFATE HFA 108 (90 BASE) MCG/ACT IN AERS: 2.0000 | INHALATION_SPRAY | RESPIRATORY_TRACT | Status: DC | PRN

## 2011-11-20 MED ORDER — DEXTROSE-NACL 5-0.45 % IV SOLN
INTRAVENOUS | Status: DC
  Administered 2011-11-20 – 2011-11-25 (×7): via INTRAVENOUS

## 2011-11-20 MED ORDER — DIPHENHYDRAMINE HCL 25 MG PO CAPS
25.0000 mg | ORAL_CAPSULE | Freq: Four times a day (QID) | ORAL | Status: DC | PRN
Start: 2011-11-20 — End: 2011-11-22
  Administered 2011-11-21 (×2): 25 mg via ORAL
  Filled 2011-11-20 (×2): qty 1

## 2011-11-20 MED ORDER — KETOROLAC TROMETHAMINE 60 MG/2ML IM SOLN
60.0000 mg | Freq: Once | INTRAMUSCULAR | Status: DC
  Filled 2011-11-20: qty 2

## 2011-11-20 MED ORDER — IOHEXOL 350 MG/ML SOLN
100.0000 mL | Freq: Once | INTRAVENOUS | Status: AC | PRN
  Administered 2011-11-20: 100 mL via INTRAVENOUS

## 2011-11-20 MED ORDER — HEPARIN (PORCINE) IN NACL 100-0.45 UNIT/ML-% IJ SOLN
1250.0000 [IU]/h | INTRAMUSCULAR | Status: DC
  Administered 2011-11-20 – 2011-11-21 (×2): 1200 [IU]/h via INTRAVENOUS
  Administered 2011-11-21 – 2011-11-25 (×5): 1250 [IU]/h via INTRAVENOUS
  Filled 2011-11-20 (×13): qty 250

## 2011-11-20 MED ORDER — FAMOTIDINE 20 MG PO TABS
20.0000 mg | ORAL_TABLET | Freq: Two times a day (BID) | ORAL | Status: DC
  Administered 2011-11-20 – 2011-11-26 (×13): 20 mg via ORAL
  Filled 2011-11-20 (×14): qty 1

## 2011-11-20 MED ORDER — FENTANYL CITRATE 0.05 MG/ML IJ SOLN
25.0000 ug | INTRAMUSCULAR | Status: DC | PRN
  Administered 2011-11-20 (×2): 25 ug via INTRAVENOUS
  Filled 2011-11-20: qty 2

## 2011-11-20 MED ORDER — ALPRAZOLAM 0.5 MG PO TABS
0.5000 mg | ORAL_TABLET | Freq: Three times a day (TID) | ORAL | Status: DC | PRN
  Administered 2011-11-20 – 2011-11-25 (×11): 0.5 mg via ORAL
  Filled 2011-11-20 (×11): qty 1

## 2011-11-20 MED ORDER — HEPARIN BOLUS VIA INFUSION
2000.0000 [IU] | Freq: Once | INTRAVENOUS | Status: AC
  Administered 2011-11-20: 2000 [IU] via INTRAVENOUS

## 2011-11-20 MED ORDER — SODIUM CHLORIDE 0.9 % IV SOLN
Freq: Once | INTRAVENOUS | Status: AC
  Administered 2011-11-20: 07:00:00 via INTRAVENOUS

## 2011-11-20 MED ORDER — FENTANYL CITRATE 0.05 MG/ML IJ SOLN
50.0000 ug | INTRAMUSCULAR | Status: DC | PRN
  Administered 2011-11-20 – 2011-11-21 (×10): 100 ug via INTRAVENOUS
  Filled 2011-11-20 (×10): qty 2

## 2011-11-20 MED ORDER — SODIUM CHLORIDE 0.9 % IV SOLN
250.0000 mL | INTRAVENOUS | Status: DC | PRN
  Administered 2011-11-25: 250 mL via INTRAVENOUS

## 2011-11-20 MED ORDER — ONDANSETRON HCL 4 MG/2ML IJ SOLN
4.0000 mg | Freq: Once | INTRAMUSCULAR | Status: AC
  Administered 2011-11-20: 4 mg via INTRAVENOUS
  Filled 2011-11-20: qty 2

## 2011-11-20 NOTE — Progress Notes (Signed)
ANTICOAGULATION CONSULT NOTE - Initial Consult  Pharmacy Consult for Heparin Indication: pulmonary embolus  Allergies  Allergen Reactions  . Morphine And Related Anaphylaxis  . Aspirin Itching  . Penicillins Itching  . Vicodin (Hydrocodone-Acetaminophen) Itching    Patient Measurements: Height: 5\' 6"  (167.6 cm) Weight: 140 lb (63.504 kg) IBW/kg (Calculated) : 59.3   Vital Signs: Temp: 98.5 F (36.9 C) (05/15 0608) Temp src: Oral (05/15 0608) BP: 109/68 mmHg (05/15 0823) Pulse Rate: 102  (05/15 0823)  Labs:  Basename 11/20/11 0652 11/20/11 0640  HGB 10.2* --  HCT 30.0* --  PLT -- --  APTT -- 33  LABPROT -- 14.4  INR -- 1.10  HEPARINUNFRC -- --  CREATININE 0.80 --  CKTOTAL -- --  CKMB -- --  TROPONINI -- --    Estimated Creatinine Clearance: 89.3 ml/min (by C-G formula based on Cr of 0.8).   Medical History: Past Medical History  Diagnosis Date  . Anemia   . Urinary tract infection   . Chronic low back pain     buldging disc  . Endometriosis   . Pneumonia     as a child  . Bronchitis   . Headache     often but no migraines  . Arthritis   . History of bladder infections   . Hypertension     but not on medication  . Bartholin's cyst     hx of  . Asthma     Medications:  Infusions:    Assessment: PE: To begin anticoagulation with Heparin.  Will use [place] nomogram to guide dosing as this approach may be more accurate in active VTE.  Note she has some anemia at baseline, platelet count is not available.  Goal of Therapy:  Heparin level 0.3-0.7 units/ml Monitor platelets by anticoagulation protocol: Yes   Plan:  Give 2000 units bolus x 1 Start heparin infusion at 1200 units/hr Check anti-Xa level in 6 hours and daily while on heparin Continue to monitor H&H and platelets.  Check CBC now for platelet count.  Estella Husk, Pharm.D., BCPS Clinical Pharmacist  Phone [phone] Pager [phone] 11/20/2011, 8:44 AM

## 2011-11-20 NOTE — ED Notes (Signed)
Patient transported to X-ray 

## 2011-11-20 NOTE — H&P (Signed)
Name: Megan Chandler MRN: [id number] DOB: 07-13-1975    LOS: 0  PULMONARY / CRITICAL CARE MEDICINE  HPI:   36 yo AA female present to [facility] ED on 5/15 for acute left sided chest pain, and shortness of breath. Patient was awakened from sleep at 5am with symptoms and unable to fall back asleep. Symptoms continued to get worse and EMS was called. In January 2013 patient had lower back surgery and has been less mobile since, and a 6 month history of unintentional weight fluctuation. Patient uses NuvaRing for heavy vaginal bleeding and endometriosis. Reports smoking 2 black and milds daily. CT scan in ED shows large left lower lobe PE, and possible RA clot. PCCM asked to admit.   Past Medical History  Diagnosis Date  . Anemia   . Urinary tract infection   . Chronic low back pain     buldging disc  . Endometriosis   . Pneumonia     as a child  . Bronchitis   . Headache     often but no migraines  . Arthritis   . History of bladder infections   . Hypertension     but not on medication  . Bartholin's cyst     hx of  . Asthma    Past Surgical History  Procedure Date  . Dilation and curettage of uterus   . Cholecystectomy, laparoscopic 1998  . Umbilical hernia repair     as a child  . Cesarean section 1996  . Laparoscopy at age 22  . Cervical spine surgery 2009  . Back surgery    Prior to Admission medications   Not on File   Allergies Allergies  Allergen Reactions  . Morphine And Related Anaphylaxis  . Aspirin Itching  . Penicillins Itching  . Vicodin (Hydrocodone-Acetaminophen) Itching    Family History Family History  Problem Relation Age of Onset  . Hypertension Mother   . Cancer Maternal Aunt   . Cancer Maternal Uncle   . Diabetes Maternal Grandmother   . Anesthesia problems Neg Hx   . Hypotension Neg Hx   . Malignant hyperthermia Neg Hx   . Pseudochol deficiency Neg Hx    Social History  reports that she has been passively smoking Cigars.  She has never  used smokeless tobacco. She reports that she uses illicit drugs (Marijuana). She reports that she does not drink alcohol.  Review Of Systems:   Review of Systems  Constitutional: + weight fluctuations, - night sweats, - Fevers,  - chills, - fatigue .  HEENT: No headaches, visual changes, Difficulty swallowing, Tooth/dental problems, or Sore throat,  No sneezing, itching, ear ache, nasal congestion, post nasal drip, no visual complaints CV: + pleuritic chest pain, + Orthopnea,  - PND, - swelling in lower extremities, - dizziness, - palpitations, - syncope.  GI No heartburn, indigestion, abdominal pain, nausea, vomiting, diarrhea, change in bowel habits, loss of appetite, bloody stools.  Resp: + cough, +SOB  No coughing up of blood. No change in color of mucus. No wheezing.  Skin: no rash or itching or icterus GU: no dysuria, change in color of urine, no urgency or frequency. No flank pain, no hematuria  MS: + chronic back and lower leg pain (not different from baseline) joint pain or swelling. No decreased range of motion  Psych: No change in mood or affect. No depression or anxiety.  Neuro: no difficulty with speech, weakness, numbness, ataxia   Brief patient description:  36 yo AA female  presented to [facility] ED on 5/15 with chest pain and SOB. CT showed left lower lobe PE and possible RA clot. History of back pain (s/p surgery 1/13), endometriosis, NuvaRing use, and mild smoking history. PCCM asked to admit.    Events Since Admission:   Current Status:  Vital Signs: Temp:  [98.5 F (36.9 C)] 98.5 F (36.9 C) (05/15 0608) Pulse Rate:  [88-102] 102  (05/15 0823) Resp:  [15-19] 19  (05/15 0730) BP: (104-118)/(65-81) 109/68 mmHg (05/15 0823) SpO2:  [100 %] 100 % (05/15 0823) Weight:  [63.504 kg (140 lb)] 63.504 kg (140 lb) (05/15 4098)  Physical Examination: General:  Anxious AA female.  Neuro:  GCS 15, no focal deficits HEENT:  PERRL, pink conjunctivae, moist membranes Neck:  Supple,  no JVD   Cardiovascular:  RRR, no M/R/G Lungs: Clear Abdomen:  Soft, nontender, nondistended, bowel sounds present Musculoskeletal:  Moves all extremities, no pedal edema Skin:  No rash  Active Problems:  Pulmonary embolism with infarction  Chest pain  Anxiety  Anemia  Endometriosis  Asthma  Active smoker   ASSESSMENT AND PLAN  PULMONARY No results found for this basename: PHART:5,PCO2:5,PCO2ART:5,PO2ART:5,HCO3:5,O2SAT:5 in the last 168 hours Ventilator Settings:   CT 5/15: 1. Study is positive for extensive occlusive pulmonary embolus to  the left lower lobe with left lower lobe pulmonary infarction, as  detailed above. In addition, there is evidence to suggest  potential clot within the inferior aspect of the right atrium.  2. Trace left-sided pleural effusion layering dependently.  CXR:   ETT:     A: Left Lower Lobe PE with possible RA clot P:   -heparin drip, will d/w pharmacy , be aggressive with clot burden -Echo assess clot?? -continue supplemental O2 for sats >92% -Lower extremity dopplers -bedrest -Hypercoagulable work up , no c, s 3 with clot and hep  A: Asthma P: -PRN SABA supplemental O2  CARDIOVASCULAR No results found for this basename: TROPONINI:5,LATICACIDVEN:5, O2SATVEN:5,PROBNP:5 in the last 168 hours ECG:   Lines: PIV  ECHO 5/15 >>>  A: Possible RA clot P:  -f/u Echo -Heparin drip, need immediate therapeutic -continue tele monitoring   RENAL  Lab 11/20/11 0652  NA 139  K 4.1  CL 106  CO2 --  BUN 7  CREATININE 0.80  CALCIUM --  MG --  PHOS --   Intake/Output    None    Foley:    A:  No acute findings P:   -monitor BMP  GASTROINTESTINAL No results found for this basename: AST:5,ALT:5,ALKPHOS:5,BILITOT:5,PROT:5,ALBUMIN:5 in the last 168 hours  A:  No acute findings P:   -keep npo  HEMATOLOGIC  Lab 11/20/11 0652 11/20/11 0640  HGB 10.2* --  HCT 30.0* --  PLT -- --  INR -- 1.10  APTT -- 33   LL Dopplers  5/15 >>> Homocystine 5/15 >>> Factor V leiden 5/15 >>> Antiphospolipid antibody 5/15 >>> Prothrombin gene mutation 5/15 >>>   A: PE, risk , less mobile s/p back surgery, nova ring P:  -f/u hypercoagulable panel  -Lower extremity dopplers -Will need CT of pelvis for possible CA in am , assess cancer, wt loss noted etc , odd late Dx endometrial disease with 59 yr old daughter -is there met diease, peritoneal etc  A: Anemia P: -follow CBC -Will transfuse if <7   INFECTIOUS No results found for this basename: WBC:5,PROCALCITON:5 in the last 168 hours Cultures:  Antibiotics:   A:  No acute problems- does have pulmonary infarct Will need to  watch for secondary infection P:   -monitor CXR, and CBC  ENDOCRINE No results found for this basename: GLUCAP:5 in the last 168 hours  A:  No acute problems P:   -Will check fasting glucose in AM  NEUROLOGIC  A:  Anxiety P: -Supportive care -may need low dose Benzo  GYN A: Endometriosis P: -have pt remove NuvaRing -f/u Pelvis CT   BEST PRACTICE / DISPOSITION - Level of Care:  ICU - Primary Service:  PCCM - Consultants:   - Code Status: Full - Diet:  NPO - DVT Px:  Heparin drip - GI Px:  H2 - Skin Integrity: intact   - Social / Family:  Daughter at bedside  BABCOCK,PETE, NP 11/20/2011, 9:07 AM  I have fully examined this patient and agree with above findings.    And edited in full.  PE, hemodynamically stable. Concern RA clot, STAT echo. Heparin, NO coumadin, NO Lovenox. Doppler legs. ICu admission for risk decline. O2 support. Ct abdo/pelvis ensure no peritoneal/ pelvic CA, h/o late Dx endometrial.  Hypercoagulable work up that we can do now    Ccm 35 min  Mcarthur Rossetti. Tyson Alias, MD, FACP Pgr: [phone] [facility]

## 2011-11-20 NOTE — Progress Notes (Signed)
  Echocardiogram 2D Echocardiogram has been performed.  Megan Chandler L 11/20/2011, 9:48 AM

## 2011-11-20 NOTE — ED Notes (Addendum)
36 year old female with history of occasional tobacco use, recent back surgery who presents with a complaint of left-sided sharp pleuritic chest pain. She states this started last night, has been persistent throughout the night and this morning, is worse with taking deep breaths but denies any fevers coughing swelling of her legs, travel, trauma.  Physical exam:  Patient is tearful, and appears in significant discomfort.  Her heart sounds are clear with a pulse of ~100 on my exam.  and regular without any murmurs or rubs, lungs are clear, patient has significant decreased respiratory effort secondary to pleuritic pain with breathing. Lower extremities without any edema, abdomen soft, oropharynx is clear, mucous memory is moist, no scleral icterus.  Assessment:  Pleuritic left-sided chest pain, EKG is nonischemic and essentially unchanged from prior EKGs. Review of the medical records shows that the patient had a CT angiogram of her chest the last 6 months which was negative for pulmonary embolus. She does use a NuvaRing, smokes cigarettes and has had a surgery in the last 5 months all of which increase her risk of PE. Due to this increased risk and patients significant discomfort, a CT scan to rule out pulmonary embolism. She does state that she feels that her pain today feels different than the pain in January.   Medical screening examination/treatment/procedure(s) were conducted as a shared visit with non-physician practitioner(s) and myself.  I personally evaluated the patient during the encounter  The CT angiogram showed that the patient had extensive pulmonary embolism in the L lower lung and has some pulmonary infarction, the R atrium of the heart also appaers to have som clot burden.  Heparin drip ordered as well as consultation with pulmonary critical care.    Critical care delivered while in the ED.  CRITICAL CARE Performed by: Vida Roller   Total critical care time: 35  Critical care  time was exclusive of separately billable procedures and treating other patients.  Critical care was necessary to treat or prevent imminent or life-threatening deterioration.  Critical care was time spent personally by me on the following activities: development of treatment plan with patient and/or surrogate as well as nursing, discussions with consultants, evaluation of patient's response to treatment, examination of patient, obtaining history from patient or surrogate, ordering and performing treatments and interventions, ordering and review of laboratory studies, ordering and review of radiographic studies, pulse oximetry and re-evaluation of patient's condition.   Vida Roller, MD 11/20/11 1610  Vida Roller, MD 11/21/11 [phone]

## 2011-11-20 NOTE — ED Notes (Signed)
Old and New EKG given to Dr Miller 

## 2011-11-20 NOTE — Progress Notes (Signed)
UR Completed.  Megan Chandler [phone] 11/20/2011

## 2011-11-20 NOTE — ED Notes (Signed)
The patient advised EMS that she started feeling left-sided chest pain that is worse with movement and shortness of breath at 2200 yesterday.  She decided to try to go to sleep and to see if she felt better in the morning.  The patient awoke at 0500 and said that both of her complaints had worsened.  The patient received nitro-stat 0.4 mg, sL x 2 en route, but she did not get any pain relief.

## 2011-11-20 NOTE — ED Provider Notes (Signed)
History     CSN: [id number]  Arrival date & time 11/20/11  0559   First MD Initiated Contact with Patient 11/20/11 [phone]      Chief Complaint  Patient presents with  . Chest Pain  . Shortness of Breath    (Consider location/radiation/quality/duration/timing/severity/associated sxs/prior treatment) HPI History from patient. 36 year old female who presents with left-sided chest pain. States this started last evening at 10 PM, and has been constant since. Pain is worse with movement and breathing, and is described as sharp and stabbing. Associated with shortness of breath. She did not take anything at home for this. She decided to sleep last evening to see she felt better in the morning. When she did not, she called EMS. She received nitroglycerin x2 in route, which was not helpful. She has had no associated nausea, vomiting, diaphoresis.  Review of previous chart indicates that she had a CT angio in Jan for chest pain - states her pain at this time feels different than Jan. Patient has no prior history of DVT/PE. Denies recent trauma or prolonged immobilization. She did have back surgery in Jan. Denies hemoptysis, leg swelling. She does use Nuvaring for birth control and admits occasional smoking.   Past Medical History  Diagnosis Date  . Anemia   . Urinary tract infection   . Chronic low back pain     buldging disc  . Endometriosis   . Pneumonia     as a child  . Bronchitis   . Headache     often but no migraines  . Arthritis   . History of bladder infections   . Hypertension     but not on medication  . Bartholin's cyst     hx of  . Asthma     Past Surgical History  Procedure Date  . Dilation and curettage of uterus   . Cholecystectomy, laparoscopic 1998  . Umbilical hernia repair     as a child  . Cesarean section 1996  . Laparoscopy at age 76  . Cervical spine surgery 2009  . Back surgery     Family History  Problem Relation Age of Onset  . Hypertension  Mother   . Cancer Maternal Aunt   . Cancer Maternal Uncle   . Diabetes Maternal Grandmother   . Anesthesia problems Neg Hx   . Hypotension Neg Hx   . Malignant hyperthermia Neg Hx   . Pseudochol deficiency Neg Hx     History  Substance Use Topics  . Smoking status: Passive Smoker -- 1 years    Types: Cigars  . Smokeless tobacco: Never Used  . Alcohol Use: No    OB History    Grav Para Term Preterm Abortions TAB SAB Ect Mult Living   2 1 1  0 1 0 1 0 0 1      Review of Systems  Constitutional: Negative for fever and chills.  HENT: Negative for congestion, sore throat and rhinorrhea.   Respiratory: Positive for shortness of breath. Negative for cough.   Cardiovascular: Positive for chest pain. Negative for palpitations and leg swelling.  Gastrointestinal: Negative for nausea, vomiting and abdominal pain.  Musculoskeletal: Negative for myalgias.  Skin: Negative for color change and rash.  Neurological: Negative for dizziness and weakness.  All other systems reviewed and are negative.    Allergies  Morphine and related; Aspirin; Penicillins; and Vicodin  Home Medications   Current Outpatient Rx  Name Route Sig Dispense Refill  . MULTI-VITAMIN/MINERALS PO TABS Oral  Take 1 tablet by mouth daily.        BP 104/65  Pulse 98  Temp(Src) 98.5 F (36.9 C) (Oral)  Resp 19  Ht 5\' 6"  (1.676 m)  Wt 140 lb (63.504 kg)  BMI 22.60 kg/m2  SpO2 100%  Physical Exam  Nursing note and vitals reviewed. Constitutional: She appears well-developed and well-nourished. No distress.       Pt crying, unable to lie still, uncomfortable appearing Unable to speak in complete sentences VSS  HENT:  Head: Normocephalic and atraumatic.  Eyes: EOM are normal.  Neck: Normal range of motion.  Cardiovascular: Normal rate, regular rhythm and normal heart sounds.  Exam reveals no gallop and no friction rub.   No murmur heard. Pulmonary/Chest: Effort normal and breath sounds normal. No  respiratory distress. She exhibits no tenderness.  Abdominal: Soft. Bowel sounds are normal. There is no tenderness. There is no rebound and no guarding.  Musculoskeletal: Normal range of motion.  Neurological: She is alert.  Skin: Skin is warm and dry. She is not diaphoretic.  Psychiatric: She has a normal mood and affect.    ED Course  Procedures (including critical care time)   Date: 11/20/2011  Rate: 95  Rhythm: normal sinus rhythm  QRS Axis: normal  Intervals: normal  ST/T Wave abnormalities: normal  Conduction Disutrbances:none  Narrative Interpretation:   Old EKG Reviewed: as compared with Jul 16 2011, rate increased  Labs Reviewed  POCT I-STAT, CHEM 8 - Abnormal; Notable for the following:    Hemoglobin 10.2 (*)    HCT 30.0 (*)    All other components within normal limits  PROTIME-INR  APTT   Ct Angio Chest W/cm &/or Wo Cm  11/20/2011  *RADIOLOGY REPORT*  Clinical Data: Left sided pleuritic chest pain with severe shortness of breath.  Recent history of back surgery.  CT ANGIOGRAPHY CHEST  Technique:  Multidetector CT imaging of the chest using the standard protocol during bolus administration of intravenous contrast. Multiplanar reconstructed images including MIPs were obtained and reviewed to evaluate the vascular anatomy.  Contrast: OMNIPAQUE IOHEXOL 350 MG/ML SOLN  Comparison: Chest CT 07/26/2011.  Findings:  Mediastinum: There is a large burden of occlusive thrombus in the distal left main pulmonary artery extending into the left lower lobe lobar, segmental and subsegmental sized branches (which all appear completely occluded).  At this time, there does not appear to be dilatation of the pulmonic trunk, or frank right-sided heart dilatation to strongly suggest presence of pulmonary arterial hypertension or right-sided heart strain. Images 52 - 56 of series 5 demonstrated a potential filling defect within the inferior aspect of the right atrium which may represent  additional right- sided thrombus.  Heart size is borderline enlarged. There is no significant pericardial fluid, thickening or pericardial calcification. No pathologically enlarged mediastinal or hilar lymph nodes. Esophagus is unremarkable in appearance.  Lungs/Pleura: Multiple areas of ground-glass attenuation throughout the basal segments of the left lower lobe, likely reflect areas of parenchymal hemorrhage from underlying pulmonary infarction.  This is most pronounced in the anterobasal segment, and where there is a wedge-shaped pleural-based opacity, compatible with pulmonary infarction.  No definite suspicious appearing pulmonary nodules or masses are identified.  A trace left-sided pleural effusion layering dependently noted at this time.  A small subpleural bleb in the right apex incidentally noted.  Upper Abdomen: Unremarkable.  Musculoskeletal: There are no aggressive appearing lytic or blastic lesions noted in the visualized portions of the skeleton. Postoperative changes of ACDF  of the lower cervical spine are incompletely visualized.  IMPRESSION: 1.  Study is positive for extensive occlusive pulmonary embolus to the left lower lobe with left lower lobe pulmonary infarction, as detailed above.  In addition, there is evidence to suggest potential clot within the inferior aspect of the right atrium. 2. Trace left-sided pleural effusion layering dependently.  Critical Value/emergent results were called by telephone at the time of interpretation on 11/20/2011  at 07:45 a.m.  to  Dr. Clarene Duke, who verbally acknowledged these results.  Original Report Authenticated By: Florencia Reasons, M.D.     No diagnosis found. 1) pulmonary embolus    MDM  8:07 AM Pt presents with pleuritic CP - CT angio positive, with extensive PE to LLL, with ?clot in RA.  D/W Dr. Bary Richard with PCCM. He agrees to admit the patient. Heparin ordered. Pt much more comfortable appearing at this time. Able to speak in complete  sentences. Findings and plan d/w her.       Grant Fontana, [place] 11/20/11 [phone]

## 2011-11-20 NOTE — Progress Notes (Signed)
*  PRELIMINARY RESULTS* Vascular Ultrasound Lower extremity venous duplex has been completed.  Preliminary findings: Bilaterally no evidence of DVT  Farrel Demark, RDMS 11/20/2011, 12:12 PM

## 2011-11-20 NOTE — Progress Notes (Signed)
Pt states she removed nuvaring herself.

## 2011-11-20 NOTE — Progress Notes (Signed)
Spoke with MD regarding pt complaint of worsening chest pain. Increased oxygen to 4L [place].  MD will write orders for increased dose of fentanyl. Vital signs stable. Will continue to monitor.

## 2011-11-20 NOTE — Progress Notes (Signed)
ANTICOAGULATION CONSULT NOTE - Follow Up  Pharmacy Consult for Heparin  Indication: pulmonary embolus   Assessment: PE:   IV heparin started for PE.  Xa level drawn slightly early and is 0.47 IU/ml on IV Heparin rate of 1200 units/hr.  Suspect this may drop and may still be exhibiting some bolus effect.  She has no noted bleeding and no complications with IV infusion per RN.   CBC is stable and platelets are elevated but this was the case for her in January of this year as well.  Goal of Therapy:  Heparin level 0.3-0.7 units/ml Monitor platelets by anticoagulation protocol: Yes   Plan:   Will increase Heparin rate to 1350 units/hr  F/U anti-Xa level in 8 hours and then daily while on heparin.  Continue to monitor H&H and platelets.   Nadara Mustard, PharmD., MS Clinical Pharmacist Phone [phone] Pager [phone] 11/20/2011, 5:12 PM Thank you for allowing pharmacy to be part of this patients care team.     Allergies  Allergen Reactions  . Morphine And Related Anaphylaxis  . Aspirin Itching  . Penicillins Itching  . Vicodin (Hydrocodone-Acetaminophen) Itching   Patient Measurements: Height: 5\' 6"  (167.6 cm) Weight: 140 lb (63.504 kg) IBW/kg (Calculated) : 59.3   Vital Signs: Temp: 97.3 F (36.3 C) (05/15 1027) Temp src: Oral (05/15 1027) BP: 102/70 mmHg (05/15 1500) Pulse Rate: 81  (05/15 1500)  Labs:  Basename 11/20/11 1547 11/20/11 1058 11/20/11 1057 11/20/11 0920 11/20/11 0652 11/20/11 0640  HGB -- -- -- 9.5* 10.2* --  HCT -- -- -- 32.4* 30.0* --  PLT -- -- -- 565* -- --  HEPARINUNFRC 0.47 -- -- -- -- --  CREATININE -- 0.63 -- -- 0.80 --  CKTOTAL -- -- 46 -- -- --  CKMB -- -- 1.0 -- -- --  TROPONINI -- -- <0.30 -- -- --    Estimated Creatinine Clearance: 89.3 ml/min (by C-G formula based on Cr of 0.63).  Medical History: Past Medical History  Diagnosis Date  . Anemia   . Urinary tract infection   . Chronic low back pain     buldging disc  .  Endometriosis   . Pneumonia     as a child  . Bronchitis   . Headache     often but no migraines  . Arthritis   . History of bladder infections   . Hypertension     but not on medication  . Bartholin's cyst     hx of  . Asthma     Medications:  Infusions:     . dextrose 5 % and 0.45% NaCl 75 mL/hr at 11/20/11 1142  . heparin 1,200 Units/hr (11/20/11 0930)

## 2011-11-20 NOTE — Progress Notes (Signed)
eLink Physician-Brief Progress Note Patient Name: Megan Chandler DOB: 1976/02/16 MRN: [id number]  Date of Service  11/20/2011   HPI/Events of Note   Fentanyl IV 25 not holding pain  eICU Interventions  Increase fentanyl to 50-100 q1h prn pain iv   Intervention Category Intermediate Interventions: Pain - evaluation and management  Shan Levans 11/20/2011, 4:08 PM

## 2011-11-21 ENCOUNTER — Encounter (HOSPITAL_COMMUNITY): Payer: Self-pay | Admitting: Obstetrics & Gynecology

## 2011-11-21 ENCOUNTER — Inpatient Hospital Stay (HOSPITAL_COMMUNITY): Payer: Medicaid Other

## 2011-11-21 DIAGNOSIS — N920 Excessive and frequent menstruation with regular cycle: Secondary | ICD-10-CM

## 2011-11-21 DIAGNOSIS — D649 Anemia, unspecified: Secondary | ICD-10-CM

## 2011-11-21 LAB — BASIC METABOLIC PANEL
BUN: 9 mg/dL (ref 6–23)
CO2: 21 mEq/L (ref 19–32)
GFR calc non Af Amer: >90 mL/min (ref >90)
Glucose, Bld: 88 mg/dL (ref 70–99)
Potassium: 3.8 mEq/L (ref 3.5–5.1)

## 2011-11-21 LAB — HEPARIN LEVEL (UNFRACTIONATED)
Heparin Unfractionated: 0.76 IU/mL — ABNORMAL HIGH (ref 0.30–0.70)
Heparin Unfractionated: 0.76 IU/mL — ABNORMAL HIGH (ref 0.30–0.70)
Heparin Unfractionated: 0.47 IU/mL (ref 0.30–0.70)

## 2011-11-21 LAB — CBC
HCT: 26 % — ABNORMAL LOW (ref 36.0–46.0)
Hemoglobin: 7.7 g/dL — ABNORMAL LOW (ref 12.0–15.0)
MCH: 21.6 pg — ABNORMAL LOW (ref 26.0–34.0)
MCHC: 29.6 g/dL — ABNORMAL LOW (ref 30.0–36.0)

## 2011-11-21 LAB — CARDIAC PANEL(CRET KIN+CKTOT+MB+TROPI): Total CK: 41 U/L (ref 7–177)

## 2011-11-21 MED ORDER — HYDROMORPHONE 0.3 MG/ML IV SOLN
INTRAVENOUS | Status: DC
  Administered 2011-11-21: 1.8 mg via INTRAVENOUS
  Administered 2011-11-21 (×2): via INTRAVENOUS
  Administered 2011-11-21: 0.6 mg via INTRAVENOUS
  Administered 2011-11-22: 1.5 mg via INTRAVENOUS
  Administered 2011-11-22: 0.6 mg via INTRAVENOUS
  Filled 2011-11-21: qty 25

## 2011-11-21 MED ORDER — NALOXONE HCL 0.4 MG/ML IJ SOLN: 0.4000 mg | INTRAMUSCULAR | Status: DC | PRN

## 2011-11-21 MED ORDER — ONDANSETRON HCL 4 MG/2ML IJ SOLN: 4.0000 mg | Freq: Four times a day (QID) | INTRAMUSCULAR | Status: DC | PRN

## 2011-11-21 MED ORDER — COUMADIN BOOK
Freq: Once | Status: AC
  Administered 2011-11-21: 16:00:00
  Filled 2011-11-21: qty 1

## 2011-11-21 MED ORDER — DIPHENHYDRAMINE HCL 12.5 MG/5ML PO ELIX
12.5000 mg | ORAL_SOLUTION | Freq: Four times a day (QID) | ORAL | Status: DC | PRN
  Filled 2011-11-21: qty 5

## 2011-11-21 MED ORDER — DIPHENHYDRAMINE HCL 50 MG/ML IJ SOLN: 12.5000 mg | Freq: Four times a day (QID) | INTRAMUSCULAR | Status: DC | PRN

## 2011-11-21 MED ORDER — SODIUM CHLORIDE 0.9 % IJ SOLN: 9.0000 mL | INTRAMUSCULAR | Status: DC | PRN

## 2011-11-21 MED ORDER — WARFARIN VIDEO
Freq: Once | Status: AC
  Administered 2011-11-22: 14:00:00

## 2011-11-21 MED ORDER — WARFARIN - PHARMACIST DOSING INPATIENT: Freq: Every day | Status: DC

## 2011-11-21 MED ORDER — MEDROXYPROGESTERONE ACETATE 10 MG PO TABS
20.0000 mg | ORAL_TABLET | Freq: Three times a day (TID) | ORAL | Status: DC
  Administered 2011-11-21 – 2011-11-26 (×17): 20 mg via ORAL
  Filled 2011-11-21 (×18): qty 2

## 2011-11-21 MED ORDER — WARFARIN SODIUM 10 MG PO TABS
10.0000 mg | ORAL_TABLET | Freq: Once | ORAL | Status: AC
  Administered 2011-11-21: 10 mg via ORAL
  Filled 2011-11-21: qty 1

## 2011-11-21 NOTE — Progress Notes (Signed)
ANTICOAGULATION CONSULT NOTE - Follow Up Consult  Pharmacy Consult for heparin Indication: pulmonary embolus  Labs:  Basename 11/21/11 0132 11/20/11 1752 11/20/11 1547 11/20/11 1058 11/20/11 1057 11/20/11 0920 11/20/11 0652 11/20/11 0640  HGB 7.7* -- -- -- -- 9.5* -- --  HCT 26.0* -- -- -- -- 32.4* 30.0* --  PLT 459* -- -- -- -- 565* -- --  APTT -- -- -- -- -- -- -- 33  LABPROT -- -- -- -- -- -- -- 14.4  INR -- -- -- -- -- -- -- 1.10  HEPARINUNFRC 0.76* -- 0.47 -- -- -- -- --  CREATININE -- -- -- 0.63 -- -- 0.80 --  CKTOTAL -- 45 -- -- 46 -- -- --  CKMB -- 1.0 -- -- 1.0 -- -- --  TROPONINI -- <0.30 -- -- <0.30 -- -- --    Assessment: 36yo female now supratherapeutic on heparin for PE after rate increase.  Goal of Therapy:  Heparin level 0.3-0.7 units/ml   Plan:  Will drop heparin gtt back down to 1250 units/hr and check level in 6hr.  Colleen Can PharmD BCPS 11/21/2011,2:29 AM

## 2011-11-21 NOTE — ED Provider Notes (Signed)
Medical screening examination/treatment/procedure(s) were conducted as a shared visit with non-physician practitioner(s) and myself.  I personally evaluated the patient during the encounter  Please see my separate respective documentation pertaining to this patient encounter   Vida Roller, MD 11/21/11 [phone]

## 2011-11-21 NOTE — Consult Note (Signed)
Reason for Consult: Acute, prolonged episode of uterine bleeding   Megan Chandler is an 36 y.o. female. Urine ICON negative.  The patient has a 2 month history of uterine bleeding daily.  There is a long history of abnormal uterine bleeding.  An U/S 7/12 was suspect for adenomyosis.  There is a question of a recent early pregnancy failure in 4/13.  The patient was using the Nuva ring for cycle control and as a contraceptive method.  She characterizes the current bleeding as a heavy flow with clotting.  The Nuva ring was discontinued in light of the recent diagnosis of a large pulmonary embolism.  Pertinent Gynecological History: Menses: see above Bleeding: dysfunctional uterine bleeding Contraception: NuvaRing vaginal inserts  Previous GYN Procedures: DNC and I & D Bartholin's gland abscess     Menstrual History: See above Patient's last menstrual period was 11/20/2011.    Past Medical History  Diagnosis Date  . Anemia   . Urinary tract infection   . Chronic low back pain     buldging disc  . Endometriosis   . Pneumonia     as a child  . Bronchitis   . Headache     often but no migraines  . Arthritis   . History of bladder infections   . Hypertension     but not on medication  . Bartholin's cyst     hx of  . Asthma     Past Surgical History  Procedure Date  . Dilation and curettage of uterus   . Cholecystectomy, laparoscopic 1998  . Umbilical hernia repair     as a child  . Cesarean section 1996  . Laparoscopy at age 46  . Cervical spine surgery 2009  . Back surgery     Family History  Problem Relation Age of Onset  . Hypertension Mother   . Cancer Maternal Aunt   . Cancer Maternal Uncle   . Diabetes Maternal Grandmother   . Anesthesia problems Neg Hx   . Hypotension Neg Hx   . Malignant hyperthermia Neg Hx   . Pseudochol deficiency Neg Hx     Social History:  reports that she has been passively smoking Cigars.  She has never used smokeless tobacco.  She reports that she uses illicit drugs (Marijuana). She reports that she does not drink alcohol.  Allergies:  Allergies  Allergen Reactions  . Morphine And Related Anaphylaxis  . Aspirin Itching  . Penicillins Itching  . Vicodin (Hydrocodone-Acetaminophen) Itching    Medications:     . coumadin book   Does not apply Once  . famotidine  20 mg Oral BID  . HYDROmorphone PCA 0.3 mg/mL   Intravenous Q4H  . medroxyPROGESTERone  20 mg Oral TID  . warfarin  10 mg Oral ONCE-1800  . warfarin   Does not apply Once  . Warfarin - Pharmacist Dosing Inpatient   Does not apply q1800    Review of Systems  Genitourinary:       See HPI    Blood pressure 105/69, pulse 95, temperature 98.5 F (36.9 C), temperature source Oral, resp. rate 30, height 5\' 6"  (1.676 m), weight 63.3 kg (139 lb 8.8 oz), last menstrual period 11/20/2011, SpO2 100.00%. Physical Exam  Constitutional:       Thin, African American female  Genitourinary: Vaginal discharge: Scant heme on pad.    Results for orders placed during the hospital encounter of 11/20/11 (from the past 48 hour(s))  PROTIME-INR  Status: Normal   Collection Time   11/20/11  6:40 AM      Component Value Range Comment   Prothrombin Time 14.4  11.6 - 15.2 (seconds)    INR 1.10  0.00 - 1.49    APTT     Status: Normal   Collection Time   11/20/11  6:40 AM      Component Value Range Comment   aPTT 33  24 - 37 (seconds)   POCT I-STAT, CHEM 8     Status: Abnormal   Collection Time   11/20/11  6:52 AM      Component Value Range Comment   Sodium 139  135 - 145 (mEq/L)    Potassium 4.1  3.5 - 5.1 (mEq/L)    Chloride 106  96 - 112 (mEq/L)    BUN 7  6 - 23 (mg/dL)    Creatinine, Ser 0.98  0.50 - 1.10 (mg/dL)    Glucose, Bld 81  70 - 99 (mg/dL)    Calcium, Ion 1.19  1.12 - 1.32 (mmol/L)    TCO2 22  0 - 100 (mmol/L)    Hemoglobin 10.2 (*) 12.0 - 15.0 (g/dL)    HCT 14.7 (*) 82.9 - 46.0 (%)   CBC     Status: Abnormal   Collection Time   11/20/11   9:20 AM      Component Value Range Comment   WBC 18.2 (*) 4.0 - 10.5 (K/uL)    RBC 4.41  3.87 - 5.11 (MIL/uL)    Hemoglobin 9.5 (*) 12.0 - 15.0 (g/dL)    HCT 56.2 (*) 13.0 - 46.0 (%)    MCV 73.5 (*) 78.0 - 100.0 (fL)    MCH 21.5 (*) 26.0 - 34.0 (pg)    MCHC 29.3 (*) 30.0 - 36.0 (g/dL)    RDW 86.5 (*) 78.4 - 15.5 (%)    Platelets 565 (*) 150 - 400 (K/uL)   HOMOCYSTEINE, SERUM     Status: Normal   Collection Time   11/20/11  9:20 AM      Component Value Range Comment   Homocysteine-Norm 11.6  4.0 - 15.4 (umol/L)   ANTIPHOSPHOLIPID SYNDROME EVAL, BLD     Status: Abnormal (Preliminary result)   Collection Time   11/20/11  9:20 AM      Component Value Range Comment   Anticardiolipin IgA PENDING  >13 (APL)    Anticardiolipin IgG PENDING  >11 (GPL)    Anticardiolipin IgM PENDING  >10 (MPL)    PTT Lupus Anticoagulant 49.6 (*) 28.0 - 43.0 (secs)    PTTLA Confirmation 2.7  <8.0 (secs)    PTTLA 4:1 Mix 45.3 (*) 28.0 - 43.0 (secs)    Drvvt 50.7 (*) <45.1 (secs)    Drvvt confirmation NOT APPL  <1.16 (Ratio)    dRVVT Incubated 1:1 Mix 39.5  <45.1 (secs)    Lupus Anticoagulant NOT DETECTED  NOT DETECTED     Phosphatydalserine, IgG 2  <16 (U/mL)    Phosphatydalserine, IgM 0  <22 (U/mL)    Phosphatydalserine, IgA 1  <20 (U/mL)   SEDIMENTATION RATE     Status: Normal   Collection Time   11/20/11  9:20 AM      Component Value Range Comment   Sed Rate 21  0 - 22 (mm/hr)   GLUCOSE, CAPILLARY     Status: Normal   Collection Time   11/20/11 10:25 AM      Component Value Range Comment   Glucose-Capillary 91  70 -  99 (mg/dL)   MRSA PCR SCREENING     Status: Normal   Collection Time   11/20/11 10:30 AM      Component Value Range Comment   MRSA by PCR NEGATIVE  NEGATIVE    URINE RAPID DRUG SCREEN (HOSP PERFORMED)     Status: Abnormal   Collection Time   11/20/11 10:40 AM      Component Value Range Comment   Opiates NONE DETECTED  NONE DETECTED     Cocaine NONE DETECTED  NONE DETECTED      Benzodiazepines NONE DETECTED  NONE DETECTED     Amphetamines NONE DETECTED  NONE DETECTED     Tetrahydrocannabinol POSITIVE (*) NONE DETECTED     Barbiturates NONE DETECTED  NONE DETECTED    PREGNANCY, URINE     Status: Normal   Collection Time   11/20/11 10:40 AM      Component Value Range Comment   Preg Test, Ur NEGATIVE  NEGATIVE    CARDIAC PANEL(CRET KIN+CKTOT+MB+TROPI)     Status: Normal   Collection Time   11/20/11 10:57 AM      Component Value Range Comment   Total CK 46  7 - 177 (U/L)    CK, MB 1.0  0.3 - 4.0 (ng/mL)    Troponin I <0.30  <0.30 (ng/mL)    Relative Index RELATIVE INDEX IS INVALID  0.0 - 2.5    PRO B NATRIURETIC PEPTIDE     Status: Normal   Collection Time   11/20/11 10:57 AM      Component Value Range Comment   Pro B Natriuretic peptide (BNP) 21.5  0 - 125 (pg/mL)   COMPREHENSIVE METABOLIC PANEL     Status: Abnormal   Collection Time   11/20/11 10:58 AM      Component Value Range Comment   Sodium 136  135 - 145 (mEq/L)    Potassium 4.2  3.5 - 5.1 (mEq/L)    Chloride 105  96 - 112 (mEq/L)    CO2 21  19 - 32 (mEq/L)    Glucose, Bld 89  70 - 99 (mg/dL)    BUN 7  6 - 23 (mg/dL)    Creatinine, Ser 4.54  0.50 - 1.10 (mg/dL)    Calcium 8.9  8.4 - 10.5 (mg/dL)    Total Protein 6.6  6.0 - 8.3 (g/dL)    Albumin 3.2 (*) 3.5 - 5.2 (g/dL)    AST 12  0 - 37 (U/L)    ALT 8  0 - 35 (U/L)    Alkaline Phosphatase 63  39 - 117 (U/L)    Total Bilirubin 0.2 (*) 0.3 - 1.2 (mg/dL)    GFR calc non Af Amer >90  >90 (mL/min)    GFR calc Af Amer >90  >90 (mL/min)   MAGNESIUM     Status: Normal   Collection Time   11/20/11 10:58 AM      Component Value Range Comment   Magnesium 1.9  1.5 - 2.5 (mg/dL)   PHOSPHORUS     Status: Normal   Collection Time   11/20/11 10:58 AM      Component Value Range Comment   Phosphorus 3.5  2.3 - 4.6 (mg/dL)   AMYLASE     Status: Normal   Collection Time   11/20/11 10:58 AM      Component Value Range Comment   Amylase 56  0 - 105 (U/L)     LIPASE, BLOOD     Status: Normal  Collection Time   11/20/11 10:58 AM      Component Value Range Comment   Lipase 16  11 - 59 (U/L)   LACTIC ACID, PLASMA     Status: Normal   Collection Time   11/20/11 11:15 AM      Component Value Range Comment   Lactic Acid, Venous 0.7  0.5 - 2.2 (mmol/L)   PROCALCITONIN     Status: Normal   Collection Time   11/20/11 11:15 AM      Component Value Range Comment   Procalcitonin <0.10     HEPARIN LEVEL (UNFRACTIONATED)     Status: Normal   Collection Time   11/20/11  3:47 PM      Component Value Range Comment   Heparin Unfractionated 0.47  0.30 - 0.70 (IU/mL)   CARDIAC PANEL(CRET KIN+CKTOT+MB+TROPI)     Status: Normal   Collection Time   11/20/11  5:52 PM      Component Value Range Comment   Total CK 45  7 - 177 (U/L)    CK, MB 1.0  0.3 - 4.0 (ng/mL)    Troponin I <0.30  <0.30 (ng/mL)    Relative Index RELATIVE INDEX IS INVALID  0.0 - 2.5    HEPARIN LEVEL (UNFRACTIONATED)     Status: Abnormal   Collection Time   11/21/11  1:32 AM      Component Value Range Comment   Heparin Unfractionated 0.76 (*) 0.30 - 0.70 (IU/mL)   CBC     Status: Abnormal   Collection Time   11/21/11  1:32 AM      Component Value Range Comment   WBC 11.0 (*) 4.0 - 10.5 (K/uL)    RBC 3.56 (*) 3.87 - 5.11 (MIL/uL)    Hemoglobin 7.7 (*) 12.0 - 15.0 (g/dL) DELTA CHECK NOTED   HCT 26.0 (*) 36.0 - 46.0 (%)    MCV 73.0 (*) 78.0 - 100.0 (fL)    MCH 21.6 (*) 26.0 - 34.0 (pg)    MCHC 29.6 (*) 30.0 - 36.0 (g/dL)    RDW 44.0 (*) 34.7 - 15.5 (%)    Platelets 459 (*) 150 - 400 (K/uL)   BASIC METABOLIC PANEL     Status: Abnormal   Collection Time   11/21/11  1:32 AM      Component Value Range Comment   Sodium 133 (*) 135 - 145 (mEq/L)    Potassium 3.8  3.5 - 5.1 (mEq/L)    Chloride 104  96 - 112 (mEq/L)    CO2 21  19 - 32 (mEq/L)    Glucose, Bld 88  70 - 99 (mg/dL)    BUN 9  6 - 23 (mg/dL)    Creatinine, Ser 4.25  0.50 - 1.10 (mg/dL)    Calcium 8.5  8.4 - 10.5 (mg/dL)     GFR calc non Af Amer >90  >90 (mL/min)    GFR calc Af Amer >90  >90 (mL/min)   CARDIAC PANEL(CRET KIN+CKTOT+MB+TROPI)     Status: Normal   Collection Time   11/21/11  1:35 AM      Component Value Range Comment   Total CK 41  7 - 177 (U/L)    CK, MB 1.0  0.3 - 4.0 (ng/mL)    Troponin I <0.30  <0.30 (ng/mL)    Relative Index RELATIVE INDEX IS INVALID  0.0 - 2.5    HEPARIN LEVEL (UNFRACTIONATED)     Status: Abnormal   Collection Time   11/21/11  8:48  AM      Component Value Range Comment   Heparin Unfractionated 0.76 (*) 0.30 - 0.70 (IU/mL)   HEPARIN LEVEL (UNFRACTIONATED)     Status: Normal   Collection Time   11/21/11  4:50 PM      Component Value Range Comment   Heparin Unfractionated 0.47  0.30 - 0.70 (IU/mL)     Ct Angio Chest W/cm &/or Wo Cm  11/20/2011  *RADIOLOGY REPORT*  Clinical Data: Left sided pleuritic chest pain with severe shortness of breath.  Recent history of back surgery.  CT ANGIOGRAPHY CHEST  Technique:  Multidetector CT imaging of the chest using the standard protocol during bolus administration of intravenous contrast. Multiplanar reconstructed images including MIPs were obtained and reviewed to evaluate the vascular anatomy.  Contrast: OMNIPAQUE IOHEXOL 350 MG/ML SOLN  Comparison: Chest CT 07/26/2011.  Findings:  Mediastinum: There is a large burden of occlusive thrombus in the distal left main pulmonary artery extending into the left lower lobe lobar, segmental and subsegmental sized branches (which all appear completely occluded).  At this time, there does not appear to be dilatation of the pulmonic trunk, or frank right-sided heart dilatation to strongly suggest presence of pulmonary arterial hypertension or right-sided heart strain. Images 52 - 56 of series 5 demonstrated a potential filling defect within the inferior aspect of the right atrium which may represent additional right- sided thrombus.  Heart size is borderline enlarged. There is no significant  pericardial fluid, thickening or pericardial calcification. No pathologically enlarged mediastinal or hilar lymph nodes. Esophagus is unremarkable in appearance.  Lungs/Pleura: Multiple areas of ground-glass attenuation throughout the basal segments of the left lower lobe, likely reflect areas of parenchymal hemorrhage from underlying pulmonary infarction.  This is most pronounced in the anterobasal segment, and where there is a wedge-shaped pleural-based opacity, compatible with pulmonary infarction.  No definite suspicious appearing pulmonary nodules or masses are identified.  A trace left-sided pleural effusion layering dependently noted at this time.  A small subpleural bleb in the right apex incidentally noted.  Upper Abdomen: Unremarkable.  Musculoskeletal: There are no aggressive appearing lytic or blastic lesions noted in the visualized portions of the skeleton. Postoperative changes of ACDF of the lower cervical spine are incompletely visualized.  IMPRESSION: 1.  Study is positive for extensive occlusive pulmonary embolus to the left lower lobe with left lower lobe pulmonary infarction, as detailed above.  In addition, there is evidence to suggest potential clot within the inferior aspect of the right atrium. 2. Trace left-sided pleural effusion layering dependently.  Critical Value/emergent results were called by telephone at the time of interpretation on 11/20/2011  at 07:45 a.m.  to  Dr. Clarene Duke, who verbally acknowledged these results.  Original Report Authenticated By: Florencia Reasons, M.D.   Dg Chest Port 1 View  11/21/2011  *RADIOLOGY REPORT*  Clinical Data: Shortness of breath  PORTABLE CHEST - 1 VIEW  Comparison: CT chest of 11/20/2011 and chest x-ray of 07/16/2011  Findings: The lungs are poorly aerated with bibasilar atelectasis and effusions.  Areas of infarction may be present in view of the recent diagnosis of large pulmonary emboli.  The heart is mildly enlarged.  A lower anterior  cervical spine fusion plate is present.  IMPRESSION: Poor aeration with bibasilar opacities consistent with atelectasis, effusions, possibly infarction.  Original Report Authenticated By: Juline Patch, M.D.    Assessment/Plan: Acute prolonged episode of uterine bleeding, anemia.  Now on anticoagulant therapy for a thromboembolic event.  -->Attempt  to arrest current bleeding with a high-dose Progestin/Provera; Provera 20 mg po TID x 7 days--> 20 mg po qd x 3 weeks--> 10 mg po qd  [name]  [id number] 11/21/2011

## 2011-11-21 NOTE — Progress Notes (Signed)
Transferred pt to 2000.Pt transferred in bed on monitor. Pt very tachypnic after standing and transferring to new bed. Complaining of ongoing chest pain. O2 increased to 3L [place].   Placed on 2000 cardiac monitor.  Report given to 2000 RN.

## 2011-11-21 NOTE — Progress Notes (Signed)
Clinical Social Worker received referral to offer support to pt.  Pt currently asleep and unable to participate in assessment.  CSW to continue to follow and assist as needed.   Angelia Mould, MSW, [place] [phone]

## 2011-11-21 NOTE — Progress Notes (Addendum)
Name: Megan Chandler MRN: [id number] DOB: 1976/02/03    LOS: 1  PULMONARY / CRITICAL CARE MEDICINE  Brief patient description:  36 yo AA female presented to [facility] ED on 5/15 with chest pain and SOB. CT showed left lower lobe PE and possible RA clot. History of back pain (s/p surgery 1/13), endometriosis, NuvaRing use, and mild smoking history. PCCM asked to admit.    Events Since Admission: 5/15: Nuvaring removed by patient, now with heavy vaginal bleeding    Current Status: Continues to complain of left sided pleuritic chest pain. Otherwise stable  Vital Signs: Temp:  [97.3 F (36.3 C)-99 F (37.2 C)] 98.5 F (36.9 C) (05/16 0824) Pulse Rate:  [65-96] 96  (05/16 0900) Resp:  [16-28] 21  (05/16 0900) BP: (95-114)/(42-86) 106/58 mmHg (05/16 0900) SpO2:  [100 %] 100 % (05/16 0900) Weight:  [63.3 kg (139 lb 8.8 oz)] 63.3 kg (139 lb 8.8 oz) (05/16 0600)  Physical Examination: General:  Anxious AA female.  Neuro:  GCS 15, no focal deficits HEENT:  PERRL, pink conjunctivae, moist membranes Neck:  Supple, no JVD   Cardiovascular:  RRR, no M/R/G Lungs: Clear Abdomen:  Soft, nontender, nondistended, bowel sounds present Musculoskeletal:  Moves all extremities, no pedal edema Skin:  No rash  Active Problems:  Pulmonary embolism with infarction  Chest pain  Anxiety  Anemia  Endometriosis  Asthma  Active smoker    ASSESSMENT AND PLAN   A: Left Lower Lobe PE- risk factors: NuvaRing, Endometriosis, limited mobility, smoking  Data:  CT 5/15: 1. Study is positive for extensive occlusive pulmonary embolus to the left lower lobe with left lower lobe pulmonary infarction, as detailed above. In addition, there is evidence to suggest potential clot within the inferior aspect of the right atrium.  2. Trace left-sided pleural effusion layering dependently. ECHO 5/15 >>> NO RA clot, EF:65% CXR:  5/16 bilateral atelectasis LL Dopplers 5/15 >>> Negative Homocystine 5/15 >>> Factor V  leiden 5/15 >>> Antiphospolipid antibody 5/15 >>> Prothrombin gene mutation 5/15 >>> P:   -heparin drip/ begin coumadin bridge ( 0/5 days of overlap, and 2 days therapeutic INR) -continue supplemental O2 for sats >92% - f/u Hypercoagulable work up , no c, s 3 with clot and hep -Will need CT pelvis r/o CA  A: Asthma P: -PRN SABA supplemental O2   A: Anemia- HCT drop s/p removal of NuvaRing  Lab 11/21/11 0132 11/20/11 0920 11/20/11 0652 11/20/11 0640  HGB 7.7* 9.5* 10.2* --  HCT 26.0* 32.4* 30.0* --  PLT 459* 565* -- --  INR -- -- -- 1.10  APTT -- -- -- 33   P: -Trend CBC -Will transfuse if <7   A:  Anxiety P: -Supportive care -continue Xanax pRN  A: pleuritic left chest pain P: - start PCA  A: Heavy Vaginal bleeding s/p Nuvaring removal P: -GYN consult ([facility] called) - f/u recs -Start Provera 20mg  TID  BEST PRACTICE / DISPOSITION - Level of Care:  Tele - Primary Service: medicine - Consultants:  GYN - Code Status: Full - Diet:  NPO - DVT Px:  Heparin drip - GI Px:  H2 - Skin Integrity: intact   - Social / Family:  Daughter at bedside  BABCOCK,PETE, NP 11/21/2011, 9:59 AM     Seen on CCM rounds this morning with resident MD or ACNP above.  Pt examined and database reviewed. I agree with above findings, assessment and plan as reflected in the note above. Transfer to [facility] and [facility]  to assume care as of 5/17 AM  Billy Fischer, MD;  PCCM service; [place] [phone]

## 2011-11-21 NOTE — Progress Notes (Addendum)
ANTICOAGULATION CONSULT NOTE - Follow Up Consult  Pharmacy Consult for heparin, Coumadin  Indication: pulmonary embolus  Labs:  Basename 11/21/11 0848 11/21/11 0135 11/21/11 0132 11/20/11 1752 11/20/11 1547 11/20/11 1058 11/20/11 1057 11/20/11 0920 11/20/11 0652 11/20/11 0640  HGB -- -- 7.7* -- -- -- -- 9.5* -- --  HCT -- -- 26.0* -- -- -- -- 32.4* 30.0* --  PLT -- -- 459* -- -- -- -- 565* -- --  APTT -- -- -- -- -- -- -- -- -- 33  LABPROT -- -- -- -- -- -- -- -- -- 14.4  INR -- -- -- -- -- -- -- -- -- 1.10  HEPARINUNFRC 0.76* -- 0.76* -- 0.47 -- -- -- -- --  CREATININE -- -- 0.67 -- -- 0.63 -- -- 0.80 --  CKTOTAL -- 41 -- 45 -- -- 46 -- -- --  CKMB -- 1.0 -- 1.0 -- -- 1.0 -- -- --  TROPONINI -- <0.30 -- <0.30 -- -- <0.30 -- -- --    Assessment: Heparin infusion continues for PE. Heparin level is still slightly above goal despite rate decrease earlier today. H/H decreased, noted patient has heavy menstrual bleeding s/p removal of nuvaring.  Received orders to begin Coumadin, noted INR at baseline is 1.1.  Goal of Therapy:  Heparin level 0.3-0.7 units/ml INR 2-3 Monitor platelets by anticoagulation protocol: Yes   Plan:  - Decrease heparin drip by 1 unit/kg/hr - Recheck heparin level in 6 hours to confirm therapeutic d/t PE with large clot burden rather than wait till AM. - Coumadin 10mg  PO today @ 6pm - Daily PT/INR - Today is day 0/5 of bridging with heparin to Coumadin, will then plan for 2 days of overlap when INR>2 - Will f/up along with you daily.  Meera K. Allena Katz, PharmD, BCPS.  Clinical Pharmacist Pager [phone]. 11/21/2011 10:34 AM     ADDENDUM:  Follow-up heparin level following rate reduction earlier today = 0.47. Level is therapeutic. Plan:  1. Continue heparin at current rate of1200 units/hr and f/u with am labs  Juliette Alcide, PharmD, BCPS.  Pager: [phone] 11/21/2011 6:33 PM

## 2011-11-22 ENCOUNTER — Encounter (HOSPITAL_COMMUNITY): Payer: Self-pay | Admitting: Radiology

## 2011-11-22 ENCOUNTER — Inpatient Hospital Stay (HOSPITAL_COMMUNITY): Payer: Medicaid Other

## 2011-11-22 DIAGNOSIS — N920 Excessive and frequent menstruation with regular cycle: Secondary | ICD-10-CM

## 2011-11-22 DIAGNOSIS — D509 Iron deficiency anemia, unspecified: Secondary | ICD-10-CM

## 2011-11-22 DIAGNOSIS — F17200 Nicotine dependence, unspecified, uncomplicated: Secondary | ICD-10-CM

## 2011-11-22 DIAGNOSIS — I10 Essential (primary) hypertension: Secondary | ICD-10-CM

## 2011-11-22 DIAGNOSIS — R079 Chest pain, unspecified: Secondary | ICD-10-CM

## 2011-11-22 LAB — FACTOR 5 ASSAY: Factor V Activity: 94 % (ref 65–150)

## 2011-11-22 LAB — ANTIPHOSPHOLIPID SYNDROME EVAL, BLD
Anticardiolipin IgA: 8 APL U/mL — ABNORMAL LOW (ref <22)
DRVVT: 50.7 secs — ABNORMAL HIGH (ref <45.1)
Lupus Anticoagulant: NOT DETECTED
PTT Lupus Anticoagulant: 49.6 secs — ABNORMAL HIGH (ref 28.0–43.0)
Phosphatydalserine, IgG: 2 U/mL (ref <16)
Phosphatydalserine, IgM: 0 U/mL (ref <22)

## 2011-11-22 LAB — CBC
MCH: 21.6 pg — ABNORMAL LOW (ref 26.0–34.0)
MCV: 72.8 fL — ABNORMAL LOW (ref 78.0–100.0)
Platelets: 421 10*3/uL — ABNORMAL HIGH (ref 150–400)
RDW: 19 % — ABNORMAL HIGH (ref 11.5–15.5)

## 2011-11-22 LAB — PROTIME-INR: Prothrombin Time: 16 seconds — ABNORMAL HIGH (ref 11.6–15.2)

## 2011-11-22 MED ORDER — IOHEXOL 300 MG/ML  SOLN
20.0000 mL | INTRAMUSCULAR | Status: AC
  Administered 2011-11-22 (×2): 20 mL via ORAL

## 2011-11-22 MED ORDER — HYDROMORPHONE HCL 1 MG/ML PO LIQD: 0.5000 mg | ORAL | Status: DC | PRN

## 2011-11-22 MED ORDER — IOHEXOL 300 MG/ML  SOLN
80.0000 mL | Freq: Once | INTRAMUSCULAR | Status: AC | PRN
  Administered 2011-11-22: 80 mL via INTRAVENOUS

## 2011-11-22 MED ORDER — WARFARIN SODIUM 10 MG PO TABS
10.0000 mg | ORAL_TABLET | Freq: Once | ORAL | Status: AC
  Administered 2011-11-22: 10 mg via ORAL
  Filled 2011-11-22 (×2): qty 1

## 2011-11-22 MED ORDER — HYDROMORPHONE HCL 2 MG PO TABS
1.0000 mg | ORAL_TABLET | ORAL | Status: DC | PRN
  Administered 2011-11-22 – 2011-11-23 (×6): 1 mg via ORAL
  Filled 2011-11-22 (×6): qty 1

## 2011-11-22 NOTE — Progress Notes (Signed)
Name: Megan Chandler MRN: [id number] DOB: [birth year]    LOS: 2  PULMONARY / CRITICAL CARE MEDICINE  Brief patient description:  36 yo AA female presented to [facility] ED on 5/15 with chest pain and SOB. CT showed left lower lobe PE and possible RA clot. History of back pain (s/p surgery 1/13), endometriosis, NuvaRing use, and mild smoking history. PCCM asked to admit.    Events Since Admission: 5/15: Nuvaring removed by patient, now with heavy vaginal bleeding 5/16: Transfer to tele  Current Status: Continued left sided chest pain, wants to get out of bed to chair; breathing is OK  Vital Signs: Temp:  [97.5 F (36.4 C)-99.4 F (37.4 C)] 97.7 F (36.5 C) (05/17 0425) Pulse Rate:  [79-109] 79  (05/17 0425) Resp:  [16-35] 35  (05/17 0800) BP: (91-107)/(51-81) 99/59 mmHg (05/17 0425) SpO2:  [90 %-100 %] 100 % (05/17 0800) 2LNC  Physical Examination: Gen: mild pain, lying in bed, sleepy but arouses HEENT: NCAT, [place] in place, OP clear PULM: diminished L base, otherwise cta CV: RRR, no mgr AB: BS+, soft, nontender Ext: no edema Neuro: somnolent but arouses spontaenously, alert and oriented x4  Active Problems:  Pulmonary embolism with infarction  Chest pain  Anxiety  Anemia  Endometriosis  Asthma  Active smoker   ASSESSMENT AND PLAN   A: Left Lower Lobe PE- risk factors: NuvaRing, Endometriosis, limited mobility, smoking  Data:  CT 5/15: 1. Study is positive for extensive occlusive pulmonary embolus to the left lower lobe with left lower lobe pulmonary infarction, as detailed above. In addition, there is evidence to suggest potential clot within the inferior aspect of the right atrium.  2. Trace left-sided pleural effusion layering dependently. ECHO 5/15 >>> NO RA clot, EF:65% CXR:  5/16 bilateral atelectasis LL Dopplers 5/15 >>> Negative Homocystine 5/15 >>> Factor V leiden 5/15 >>> Antiphospolipid antibody 5/15 >>>  Prothrombin gene mutation 5/15 >>>  P:   -heparin  drip/ begin coumadin bridge ( 0/5 days of overlap, and 2 days therapeutic INR) -continue supplemental O2 for sats >92% - f/u Hypercoagulable work up , no c, s 3 with clot and hep - Will need CT pelvis r/o CA (could have today)  A: Asthma, no exacerbation P: -PRN SABA supplemental O2   A: Anemia- HCT drop s/p removal of NuvaRing; vaginal bleeding  Lab 11/22/11 0545 11/21/11 0132 11/20/11 0920 11/20/11 0652 11/20/11 0640  HGB 7.4* 7.7* 9.5* 10.2* --  HCT 24.9* 26.0* 32.4* 30.0* --  PLT 421* 459* 565* -- --  INR 1.25 -- -- -- 1.10  APTT -- -- -- -- 33   P: -Trend CBC -Will transfuse if <7  -Appreciate Gyne rec's for short term provera in setting of anemia  A:  Anxiety P: -Supportive care -continue Xanax pRN  A: pleuritic left chest pain; over sedated today P: - stop PCA - Start oral dilaudid (0.5mg ) prn pain (may need intermittent IV breakthrough)  A: Heavy Vaginal bleeding s/p Nuvaring removal P: -GYN consult per Dr. Tamela Oddi - appreciate consult -Start Provera 20mg  TID  BEST PRACTICE / DISPOSITION - Level of Care:  Tele - Primary Service: TRH - Consultants:  GYN - Code Status: Full - Diet:  NPO - DVT Px:  Heparin drip - GI Px:  H2 - Skin Integrity: intact   - Social / Family:  Daughter at bedside  PCCM to see as needed over weekend  Yolonda Kida PCCM Pager: [phone] Cell: [phone] If no response, call [phone]

## 2011-11-22 NOTE — Clinical Social Work Psychosocial (Signed)
     Clinical Social Work Department BRIEF PSYCHOSOCIAL ASSESSMENT 11/22/2011  Patient:  DUDLEY, COOLEY     Account Number:  [id number]     Admit date:  11/20/2011  Clinical Social Worker:  Margaree Mackintosh  Date/Time:  11/22/2011 01:32 PM  Referred by:  RN  Date Referred:  11/22/2011 Referred for  Other - See comment   Other Referral:   support   Interview type:  Patient Other interview type:   with father present    PSYCHOSOCIAL DATA Living Status:  FAMILY Admitted from facility:   Level of care:   Primary support name:  [phone] Primary support relationship to patient:  PARENT Degree of support available:   Unknown.    CURRENT CONCERNS Current Concerns  Other - See comment   Other Concerns:   support.    SOCIAL WORK ASSESSMENT / PLAN Clinical Social Worker met wiht pt and father at bedside. CSW introduced self, explained role, and provided support. CSW provided opportunity for pt to process difficult feelings.  Pt endorses feelings of stress related to hospitalization.  CSW reviewed support system.  Pt states she is ready to cease smoking and is interested in utilizing an alternate birth contol pill.  CSW to sign off at this time as no other needs are identified.  Please re consult if needed.   Assessment/plan status:   Other assessment/ plan:   Information/referral to community resources:    PATIENTS/FAMILYS RESPONSE TO PLAN OF CARE:

## 2011-11-22 NOTE — Progress Notes (Signed)
Subjective: Patient sleepy, sitting up getting bath   Objective: Vital signs in last 24 hours: Filed Vitals:   11/22/11 0038 11/22/11 0425 11/22/11 0435 11/22/11 0800  BP: 102/65 99/59    Pulse: 81 79    Temp: 97.5 F (36.4 C) 97.7 F (36.5 C)    TempSrc: Oral Oral    Resp: 17 23 18  35  Height:      Weight:      SpO2: 100% 100% 100% 100%   Weight change:   Intake/Output Summary (Last 24 hours) at 11/22/11 1112 Last data filed at 11/22/11 0802  Gross per 24 hour  Intake   1221 ml  Output      0 ml  Net   1221 ml    Physical Exam: General: Awake, Oriented, No acute distress. HEENT: EOMI. Neck: Supple CV: S1 and S2, rrr Lungs: Clear to ascultation bilaterally Abdomen: Soft, Nontender, Nondistended, +bowel sounds. Ext: Good pulses. Trace edema.   Lab Results:  Basename 11/21/11 0132 11/20/11 1058  NA 133* 136  K 3.8 4.2  CL 104 105  CO2 21 21  GLUCOSE 88 89  BUN 9 7  CREATININE 0.67 0.63  CALCIUM 8.5 8.9  MG -- 1.9  PHOS -- 3.5    Basename 11/20/11 1058  AST 12  ALT 8  ALKPHOS 63  BILITOT 0.2*  PROT 6.6  ALBUMIN 3.2*    Basename 11/20/11 1058  LIPASE 16  AMYLASE 56    Basename 11/22/11 0545 11/21/11 0132  WBC 11.0* 11.0*  NEUTROABS -- --  HGB 7.4* 7.7*  HCT 24.9* 26.0*  MCV 72.8* 73.0*  PLT 421* 459*    Basename 11/21/11 0135 11/20/11 1752 11/20/11 1057  CKTOTAL 41 45 46  CKMB 1.0 1.0 1.0  CKMBINDEX -- -- --  TROPONINI <0.30 <0.30 <0.30   No components found with this basename: POCBNP:3 No results found for this basename: DDIMER:2 in the last 72 hours No results found for this basename: HGBA1C:2 in the last 72 hours No results found for this basename: CHOL:2,HDL:2,LDLCALC:2,TRIG:2,CHOLHDL:2,LDLDIRECT:2 in the last 72 hours No results found for this basename: TSH,T4TOTAL,FREET3,T3FREE,THYROIDAB in the last 72 hours No results found for this basename: VITAMINB12:2,FOLATE:2,FERRITIN:2,TIBC:2,IRON:2,RETICCTPCT:2 in the last 72  hours  Micro Results: Recent Results (from the past 240 hour(s))  MRSA PCR SCREENING     Status: Normal   Collection Time   11/20/11 10:30 AM      Component Value Range Status Comment   MRSA by PCR NEGATIVE  NEGATIVE  Final     Studies/Results: Dg Chest Port 1 View  11/21/2011  *RADIOLOGY REPORT*  Clinical Data: Shortness of breath  PORTABLE CHEST - 1 VIEW  Comparison: CT chest of 11/20/2011 and chest x-ray of 07/16/2011  Findings: The lungs are poorly aerated with bibasilar atelectasis and effusions.  Areas of infarction may be present in view of the recent diagnosis of large pulmonary emboli.  The heart is mildly enlarged.  A lower anterior cervical spine fusion plate is present.  IMPRESSION: Poor aeration with bibasilar opacities consistent with atelectasis, effusions, possibly infarction.  Original Report Authenticated By: Juline Patch, M.D.    Medications: I have reviewed the patient's current medications. Scheduled Meds:   . coumadin book   Does not apply Once  . famotidine  20 mg Oral BID  . medroxyPROGESTERone  20 mg Oral TID  . warfarin  10 mg Oral ONCE-1800  . warfarin  10 mg Oral ONCE-1800  . warfarin   Does not apply  Once  . Warfarin - Pharmacist Dosing Inpatient   Does not apply q1800  . DISCONTD: HYDROmorphone PCA 0.3 mg/mL   Intravenous Q4H   Continuous Infusions:   . dextrose 5 % and 0.45% NaCl 75 mL/hr at 11/21/11 1620  . heparin 1,250 Units/hr (11/22/11 0723)   PRN Meds:.sodium chloride, albuterol, ALPRAZolam, HYDROmorphone, naloxone, ondansetron (ZOFRAN) IV, sodium chloride, DISCONTD: diphenhydrAMINE, DISCONTD: diphenhydrAMINE, DISCONTD: diphenhydrAMINE, DISCONTD: fentaNYL, DISCONTD: HYDROmorphone HCl  Assessment/Plan: A: Left Lower Lobe PE- risk factors: NuvaRing, Endometriosis, limited mobility, smoking  Data:  CT 5/15: 1. Study is positive for extensive occlusive pulmonary embolus to the left lower lobe with left lower lobe pulmonary infarction, as detailed  above. In addition, there is evidence to suggest potential clot within the inferior aspect of the right atrium.  2. Trace left-sided pleural effusion layering dependently.  ECHO 5/15 >>> NO RA clot, EF:65%  CXR: 5/16 bilateral atelectasis  LL Dopplers 5/15 >>> Negative  P:  -heparin drip/ begin coumadin bridge (1/5 days of overlap, and 2 days therapeutic INR)  -continue supplemental O2 for sats >92%  - f/u Hypercoagulable work up , no c, s 3 with clot and hep  - Will need CT pelvis r/o CA   A: Asthma, no exacerbation  P:  -PRN SABA  supplemental O2   A: Anemia- HCT drop s/p removal of NuvaRing; vaginal bleeding   P:  -Trend CBC  -Will transfuse if <7  -Appreciate Gyne rec's for short term provera in setting of anemia   A: Anxiety  P:  -Supportive care  -continue Xanax pRN   A: pleuritic left chest pain; over sedated today  P:  - stop PCA  - Start oral dilaudid (0.5mg ) prn pain (may need intermittent IV breakthrough)   A: Heavy Vaginal bleeding s/p Nuvaring removal  P:  -GYN consult per Dr. Tamela Oddi - appreciate consult  -Provera 20mg  TID  NEED TO FIND: PCP Who to follow the coumdain   LOS: 2 days  Keyonia Gluth, DO 11/22/2011, 11:12 AM

## 2011-11-22 NOTE — Progress Notes (Signed)
Subjective: Vaginal bleeding less today  Objective: Vital signs in last 24 hours: Temp:  [97.5 F (36.4 C)-98.9 F (37.2 C)] 98.9 F (37.2 C) (05/17 1434) Pulse Rate:  [79-98] 98  (05/17 1434) Resp:  [16-35] 20  (05/17 1434) BP: (99-113)/(59-76) 113/76 mmHg (05/17 1434) SpO2:  [90 %-100 %] 100 % (05/17 1434)   Exam, limited:  scant per vaginal bleeding  Results for orders placed during the hospital encounter of 11/20/11 (from the past 24 hour(s))  HEPARIN LEVEL (UNFRACTIONATED)     Status: Abnormal   Collection Time   11/22/11  5:45 AM      Component Value Range   Heparin Unfractionated 0.21 (*) 0.30 - 0.70 (IU/mL)  CBC     Status: Abnormal   Collection Time   11/22/11  5:45 AM      Component Value Range   WBC 11.0 (*) 4.0 - 10.5 (K/uL)   RBC 3.42 (*) 3.87 - 5.11 (MIL/uL)   Hemoglobin 7.4 (*) 12.0 - 15.0 (g/dL)   HCT 16.1 (*) 09.6 - 46.0 (%)   MCV 72.8 (*) 78.0 - 100.0 (fL)   MCH 21.6 (*) 26.0 - 34.0 (pg)   MCHC 29.7 (*) 30.0 - 36.0 (g/dL)   RDW 04.5 (*) 40.9 - 15.5 (%)   Platelets 421 (*) 150 - 400 (K/uL)  PROTIME-INR     Status: Abnormal   Collection Time   11/22/11  5:45 AM      Component Value Range   Prothrombin Time 16.0 (*) 11.6 - 15.2 (seconds)   INR 1.25  0.00 - 1.49   HEPARIN LEVEL (UNFRACTIONATED)     Status: Normal   Collection Time   11/22/11 12:59 PM      Component Value Range   Heparin Unfractionated 0.55  0.30 - 0.70 (IU/mL)     Assessment/Plan: Abnormal uterine bleeding, now on anticoagulant therapy Appears to be responding to the high-dose progestin  -->will re-consult as needed     LOS: 2 days   JACKSON-MOORE,Omaria Plunk A

## 2011-11-22 NOTE — Progress Notes (Signed)
ANTICOAGULATION CONSULT NOTE - Follow Up Consult  Pharmacy Consult for heparin, Coumadin  Indication: pulmonary embolus LLL   Labs:  Basename 11/22/11 1259 11/22/11 0545 11/21/11 1650 11/21/11 0135 11/21/11 0132 11/20/11 1752 11/20/11 1058 11/20/11 1057 11/20/11 0920 11/20/11 0652 11/20/11 0640  HGB -- 7.4* -- -- 7.7* -- -- -- -- -- --  HCT -- 24.9* -- -- 26.0* -- -- -- 32.4* -- --  PLT -- 421* -- -- 459* -- -- -- 565* -- --  APTT -- -- -- -- -- -- -- -- -- -- 33  LABPROT -- 16.0* -- -- -- -- -- -- -- -- 14.4  INR -- 1.25 -- -- -- -- -- -- -- -- 1.10  HEPARINUNFRC 0.55 0.21* 0.47 -- -- -- -- -- -- -- --  CREATININE -- -- -- -- 0.67 -- 0.63 -- -- 0.80 --  CKTOTAL -- -- -- 41 -- 45 -- 46 -- -- --  CKMB -- -- -- 1.0 -- 1.0 -- 1.0 -- -- --  TROPONINI -- -- -- <0.30 -- <0.30 -- <0.30 -- -- --    Assessment: 36 yo female on heparin / Coumadin for pulmonary embolism. H/H decreased, noted patient has heavy vaginal bleeding s/p removal of nuvaring.  6 hour Heparin level =0.55 is now therapeutic on IV heparin drip rate 1250 units/hr. INR =1.25,  PLTC 421K,  Hgb 7.4   Goal of Therapy:  Heparin level 0.3-0.7 units/ml INR 2-3 Monitor platelets by anticoagulation protocol: Yes   Plan:  1. Continue IV heparin 1250 units/hr.  2. Heparin level, INR and CBC qAM. (see previous pharmacy note for coumadin orders today).   Noah Delaine, RPh Clinical Pharmacist 11/22/2011 1:54 PM

## 2011-11-22 NOTE — Progress Notes (Signed)
Pt refused walk. Pt had family earlier and took a nap and does not feel like walking. Educated on the importance of walking.

## 2011-11-22 NOTE — Progress Notes (Signed)
ANTICOAGULATION CONSULT NOTE - Follow Up Consult  Pharmacy Consult for heparin, Coumadin  Indication: pulmonary embolus  Labs:  Basename 11/22/11 0545 11/21/11 1650 11/21/11 0848 11/21/11 0135 11/21/11 0132 11/20/11 1752 11/20/11 1058 11/20/11 1057 11/20/11 0920 11/20/11 0652 11/20/11 0640  HGB 7.4* -- -- -- 7.7* -- -- -- -- -- --  HCT 24.9* -- -- -- 26.0* -- -- -- 32.4* -- --  PLT 421* -- -- -- 459* -- -- -- 565* -- --  APTT -- -- -- -- -- -- -- -- -- -- 33  LABPROT 16.0* -- -- -- -- -- -- -- -- -- 14.4  INR 1.25 -- -- -- -- -- -- -- -- -- 1.10  HEPARINUNFRC 0.21* 0.47 0.76* -- -- -- -- -- -- -- --  CREATININE -- -- -- -- 0.67 -- 0.63 -- -- 0.80 --  CKTOTAL -- -- -- 41 -- 45 -- 46 -- -- --  CKMB -- -- -- 1.0 -- 1.0 -- 1.0 -- -- --  TROPONINI -- -- -- <0.30 -- <0.30 -- <0.30 -- -- --    Assessment: 36 yo female on heparin / Coumadin for pulmonary embolism. H/H decreased, noted patient has heavy menstrual bleeding s/p removal of nuvaring. Heparin level (0.21) is below-goal on 1200 units/hr. INR is below-goal.   Goal of Therapy:  Heparin level 0.3-0.7 units/ml INR 2-3 Monitor platelets by anticoagulation protocol: Yes   Plan:  1. Increase IV heparin to 1250 units/hr.  2. Heparin level in 6 hours. 3. Coumadin 10mg  po today.   Lorre Munroe, PharmD 11/22/2011 6:52 AM

## 2011-11-23 LAB — CBC
Hemoglobin: 7.4 g/dL — ABNORMAL LOW (ref 12.0–15.0)
Platelets: 450 10*3/uL — ABNORMAL HIGH (ref 150–400)
RBC: 3.38 MIL/uL — ABNORMAL LOW (ref 3.87–5.11)
WBC: 10.4 10*3/uL (ref 4.0–10.5)

## 2011-11-23 LAB — PROTIME-INR
INR: 2.03 — ABNORMAL HIGH (ref 0.00–1.49)
Prothrombin Time: 23.3 seconds — ABNORMAL HIGH (ref 11.6–15.2)

## 2011-11-23 MED ORDER — HYDROMORPHONE HCL PF 1 MG/ML IJ SOLN
0.5000 mg | Freq: Once | INTRAMUSCULAR | Status: AC
  Administered 2011-11-23: 0.5 mg via INTRAVENOUS
  Filled 2011-11-23: qty 1

## 2011-11-23 MED ORDER — HYDROMORPHONE HCL PF 1 MG/ML IJ SOLN
0.5000 mg | INTRAMUSCULAR | Status: DC | PRN
  Administered 2011-11-23 – 2011-11-25 (×7): 0.5 mg via INTRAVENOUS
  Filled 2011-11-23 (×7): qty 1

## 2011-11-23 MED ORDER — HYDROMORPHONE HCL 2 MG PO TABS
1.0000 mg | ORAL_TABLET | Freq: Four times a day (QID) | ORAL | Status: DC
  Administered 2011-11-23 – 2011-11-24 (×5): 1 mg via ORAL
  Filled 2011-11-23 (×3): qty 1

## 2011-11-23 NOTE — Progress Notes (Signed)
Subjective: Patient sleepy, laying bed still, mom at bedside Encouraged patient to get up Bleeding decreased  Objective: Vital signs in last 24 hours: Filed Vitals:   11/22/11 1434 11/22/11 2032 11/23/11 0537 11/23/11 0835  BP: 113/76 110/69 101/71 102/61  Pulse: 98 94 80 101  Temp: 98.9 F (37.2 C) 98.1 F (36.7 C) 98.6 F (37 C)   TempSrc: Oral Oral Oral   Resp: 20 20 18 22   Height:      Weight:      SpO2: 100% 100% 100% 100%   Weight change:   Intake/Output Summary (Last 24 hours) at 11/23/11 0849 Last data filed at 11/22/11 1800  Gross per 24 hour  Intake    240 ml  Output   1000 ml  Net   -760 ml    Physical Exam: General: Awake, Oriented, No acute distress. HEENT: EOMI. Neck: Supple CV: S1 and S2, rrr Lungs: Clear to ascultation bilaterally Abdomen: Soft, Nontender, Nondistended, +bowel sounds. Ext: Good pulses. Trace edema.   Lab Results:  Basename 11/21/11 0132 11/20/11 1058  NA 133* 136  K 3.8 4.2  CL 104 105  CO2 21 21  GLUCOSE 88 89  BUN 9 7  CREATININE 0.67 0.63  CALCIUM 8.5 8.9  MG -- 1.9  PHOS -- 3.5    Basename 11/20/11 1058  AST 12  ALT 8  ALKPHOS 63  BILITOT 0.2*  PROT 6.6  ALBUMIN 3.2*    Basename 11/20/11 1058  LIPASE 16  AMYLASE 56    Basename 11/23/11 0540 11/22/11 0545  WBC 10.4 11.0*  NEUTROABS -- --  HGB 7.4* 7.4*  HCT 24.8* 24.9*  MCV 73.4* 72.8*  PLT 450* 421*    Basename 11/21/11 0135 11/20/11 1752 11/20/11 1057  CKTOTAL 41 45 46  CKMB 1.0 1.0 1.0  CKMBINDEX -- -- --  TROPONINI <0.30 <0.30 <0.30   No components found with this basename: POCBNP:3 No results found for this basename: DDIMER:2 in the last 72 hours No results found for this basename: HGBA1C:2 in the last 72 hours No results found for this basename: CHOL:2,HDL:2,LDLCALC:2,TRIG:2,CHOLHDL:2,LDLDIRECT:2 in the last 72 hours No results found for this basename: TSH,T4TOTAL,FREET3,T3FREE,THYROIDAB in the last 72 hours No results found for this  basename: VITAMINB12:2,FOLATE:2,FERRITIN:2,TIBC:2,IRON:2,RETICCTPCT:2 in the last 72 hours  Micro Results: Recent Results (from the past 240 hour(s))  MRSA PCR SCREENING     Status: Normal   Collection Time   11/20/11 10:30 AM      Component Value Range Status Comment   MRSA by PCR NEGATIVE  NEGATIVE  Final     Studies/Results: Ct Abdomen Pelvis W Contrast  11/22/2011  *RADIOLOGY REPORT*  Clinical Data: Chest pain.  Evaluate for potential cancer.  The patient had DVT.  CT ABDOMEN AND PELVIS WITH CONTRAST  Technique:  Multidetector CT imaging of the abdomen and pelvis was performed following the standard protocol during bolus administration of intravenous contrast.  Contrast: 80mL OMNIPAQUE IOHEXOL 300 MG/ML  SOLN  Comparison: CT of abdomen and pelvis 02/03/2011.  Findings:  Lung Bases: Extensive occlusive thrombus is noted throughout all aspects of the left lower lobe pulmonary arterial tree with associated large pulmonary infarction in the left lower lobe, and small amount of left-sided pleural effusion.  Some new atelectasis is now noted in the right lower lobe.  Abdomen/Pelvis:  The patient is status post cholecystectomy.  There is mild intrahepatic biliary ductal dilatation.  Common bile duct itself does not appear dilated.  No definite focal hepatic lesions are  noted.  The appearance of the pancreas, spleen, bilateral adrenal glands and bilateral kidneys is unremarkable.  There is no ascites or pneumoperitoneum and no pathologic distension of bowel. No definite pathologic adenopathy identified within the abdomen or pelvis.  The uterus, bilateral ovaries and urinary bladder are unremarkable in appearance.  Musculoskeletal: There are no aggressive appearing lytic or blastic lesions noted in the visualized portions of the skeleton.  The patient is status post PLIF at L3-L4.  IMPRESSION: 1.  No definitive evidence of malignancy within the abdomen or pelvis identified on today's examination. 2.   Persistent occlusive thrombus throughout the left lower lobe with large left lower lobe pulmonary infarction.  There is now a small overlying left-sided pleural effusion. 3.  Mild intrahepatic biliary ductal dilatation.  Common bile duct does not appear dilated.  Correlation with LFTs may be warranted. 3.  Status post PLIF at L3-L4. 4.  Status post cholecystectomy.  Original Report Authenticated By: Florencia Reasons, M.D.    Medications: I have reviewed the patient's current medications. Scheduled Meds:    . famotidine  20 mg Oral BID  .  HYDROmorphone (DILAUDID) injection  0.5 mg Intravenous Once  . HYDROmorphone  1 mg Oral QID  . iohexol  20 mL Oral Q1 Hr x 2  . medroxyPROGESTERone  20 mg Oral TID  . warfarin  10 mg Oral ONCE-1800  . warfarin   Does not apply Once  . Warfarin - Pharmacist Dosing Inpatient   Does not apply q1800  . DISCONTD: HYDROmorphone PCA 0.3 mg/mL   Intravenous Q4H   Continuous Infusions:    . dextrose 5 % and 0.45% NaCl 75 mL/hr at 11/22/11 1719  . heparin 1,250 Units/hr (11/22/11 1719)   PRN Meds:.sodium chloride, albuterol, ALPRAZolam, HYDROmorphone (DILAUDID) injection, iohexol, naloxone, ondansetron (ZOFRAN) IV, sodium chloride, DISCONTD: diphenhydrAMINE, DISCONTD: diphenhydrAMINE, DISCONTD: diphenhydrAMINE, DISCONTD: HYDROmorphone, DISCONTD: HYDROmorphone HCl  Assessment/Plan: A: Left Lower Lobe PE- risk factors: NuvaRing, Endometriosis, limited mobility, smoking  Data:  CT 5/15: 1. Study is positive for extensive occlusive pulmonary embolus to the left lower lobe with left lower lobe pulmonary infarction, as detailed above. In addition, there is evidence to suggest potential clot within the inferior aspect of the right atrium.  2. Trace left-sided pleural effusion layering dependently.  ECHO 5/15 >>> NO RA clot, EF:65%  CXR: 5/16 bilateral atelectasis  LL Dopplers 5/15 >>> Negative  P:  -heparin drip/ begin coumadin bridge (2/5 days of overlap, and 2  days therapeutic INR)  -continue supplemental O2 for sats >92%  - f/u Hypercoagulable work up once off coumadin -  CT pelvis r/o CA - negative for malignacy  A: Asthma, no exacerbation  P:  -PRN SABA  supplemental O2   A: Anemia- HCT drop s/p removal of NuvaRing; vaginal bleeding   P:  -Trend CBC  -Will transfuse if <7  -Appreciate Gyne rec's for short term provera in setting of anemia   A: Anxiety  P:  -Supportive care  -continue Xanax pRN   A: pleuritic left chest pain; over sedated today  P:  - stop PCA  - Start oral dilaudid (0.5mg ) pain add IV PRN  A: Heavy Vaginal bleeding s/p Nuvaring removal  P:  -GYN consult per Dr. Tamela Oddi - appreciate consult, Hgb stable- bleeding decreased -Provera 20mg  TID  NEED TO FIND: PCP Who to follow the coumdain   LOS: 3 days  Tena Linebaugh, DO 11/23/2011, 8:49 AM

## 2011-11-23 NOTE — Progress Notes (Signed)
Pt has order for 1 mg of po dilaudid Q 4 hours.  2 mg Pills have to be cut in half. I have left 1/2 pill= 1 mg in baggie marked with original pill pouch and bar code. Will pass along to oncoming RN Lafonda Mosses. Thomas Hoff

## 2011-11-23 NOTE — Progress Notes (Signed)
ANTICOAGULATION CONSULT NOTE - Follow Up Consult  Pharmacy Consult for heparin, Coumadin  Indication: pulmonary embolus LLL   Labs:  Basename 11/23/11 0540 11/22/11 1259 11/22/11 0545 11/21/11 0135 11/21/11 0132 11/20/11 1752  HGB 7.4* -- 7.4* -- -- --  HCT 24.8* -- 24.9* -- 26.0* --  PLT 450* -- 421* -- 459* --  APTT -- -- -- -- -- --  LABPROT 23.3* -- 16.0* -- -- --  INR 2.03* -- 1.25 -- -- --  HEPARINUNFRC 0.37 0.55 0.21* -- -- --  CREATININE -- -- -- -- 0.67 --  CKTOTAL -- -- -- 41 -- 45  CKMB -- -- -- 1.0 -- 1.0  TROPONINI -- -- -- <0.30 -- <0.30    Assessment: 36 yo female on Day#3 overlap of heparin / Coumadin for pulmonary embolism. Today's Heparin level is therapeutic at 0.37 on IV heparin drip rate 1250 units/hr. INR =2.03 is therapeutic but significant increase from 1.25 yesterday after 2 days of 10mg  Coumadin.  Hgb remains 7.4 today, PLTC 450K. H/H 7.4/24.8 has decreased from admit H/H 10.2/30; noted patient had heavy vaginal bleeding s/p removal of nuvaring. MD notes today that bleeding has decreased.    Goal of Therapy:  Heparin level 0.3-0.7 units/ml INR 2-3 Monitor platelets by anticoagulation protocol: Yes   Plan:  Continue IV heparin 1250 units/hr.  Hold Coumadin dose today.  Monitor Heparin level, INR and CBC qAM.   Noah Delaine, RPh Clinical Pharmacist 11/23/2011 12:48 PM

## 2011-11-24 LAB — CBC
Platelets: 458 10*3/uL — ABNORMAL HIGH (ref 150–400)
RBC: 3.51 MIL/uL — ABNORMAL LOW (ref 3.87–5.11)
RDW: 19 % — ABNORMAL HIGH (ref 11.5–15.5)
WBC: 11.1 10*3/uL — ABNORMAL HIGH (ref 4.0–10.5)

## 2011-11-24 LAB — PROTIME-INR
INR: 2.22 — ABNORMAL HIGH (ref 0.00–1.49)
Prothrombin Time: 25 seconds — ABNORMAL HIGH (ref 11.6–15.2)

## 2011-11-24 LAB — HEPARIN LEVEL (UNFRACTIONATED): Heparin Unfractionated: 0.43 IU/mL (ref 0.30–0.70)

## 2011-11-24 MED ORDER — WARFARIN SODIUM 7.5 MG PO TABS
7.5000 mg | ORAL_TABLET | Freq: Once | ORAL | Status: AC
  Administered 2011-11-24: 7.5 mg via ORAL
  Filled 2011-11-24: qty 1

## 2011-11-24 MED ORDER — OXYCODONE-ACETAMINOPHEN 5-325 MG PO TABS
1.0000 | ORAL_TABLET | Freq: Four times a day (QID) | ORAL | Status: DC
  Administered 2011-11-24 – 2011-11-25 (×7): 1 via ORAL
  Filled 2011-11-24 (×7): qty 1

## 2011-11-24 NOTE — Progress Notes (Addendum)
Subjective:  laying bed still - says got up and given self shower Encouraged patient to get up   Objective: Vital signs in last 24 hours: Filed Vitals:   11/23/11 0835 11/23/11 1441 11/23/11 2124 11/24/11 0406  BP: 102/61 106/66 98/62 100/65  Pulse: 101 90 92 98  Temp:  98.8 F (37.1 C) 98.4 F (36.9 C) 98.1 F (36.7 C)  TempSrc:   Oral Oral  Resp: 22 18 20 20   Height:      Weight:      SpO2: 100% 100% 100% 100%   Weight change:   Intake/Output Summary (Last 24 hours) at 11/24/11 0951 Last data filed at 11/24/11 0655  Gross per 24 hour  Intake 5348.75 ml  Output    550 ml  Net 4798.75 ml    Physical Exam: General: Awake, Oriented, No acute distress- sleepy HEENT: EOMI. Neck: Supple CV: S1 and S2, rrr Lungs: Clear to ascultation bilaterally, no wheezing Abdomen: Soft, Nontender, Nondistended, +bowel sounds. Ext: Good pulses. Trace edema.   Lab Results: No results found for this basename: NA:2,K:2,CL:2,CO2:2,GLUCOSE:2,BUN:2,CREATININE:2,CALCIUM:2,MG:2,PHOS:2 in the last 72 hours No results found for this basename: AST:2,ALT:2,ALKPHOS:2,BILITOT:2,PROT:2,ALBUMIN:2 in the last 72 hours No results found for this basename: LIPASE:2,AMYLASE:2 in the last 72 hours  Basename 11/24/11 0543 11/23/11 0540  WBC 11.1* 10.4  NEUTROABS -- --  HGB 7.5* 7.4*  HCT 25.4* 24.8*  MCV 72.4* 73.4*  PLT 458* 450*   No results found for this basename: CKTOTAL:3,CKMB:3,CKMBINDEX:3,TROPONINI:3 in the last 72 hours No components found with this basename: POCBNP:3 No results found for this basename: DDIMER:2 in the last 72 hours No results found for this basename: HGBA1C:2 in the last 72 hours No results found for this basename: CHOL:2,HDL:2,LDLCALC:2,TRIG:2,CHOLHDL:2,LDLDIRECT:2 in the last 72 hours No results found for this basename: TSH,T4TOTAL,FREET3,T3FREE,THYROIDAB in the last 72 hours No results found for this basename: VITAMINB12:2,FOLATE:2,FERRITIN:2,TIBC:2,IRON:2,RETICCTPCT:2  in the last 72 hours  Micro Results: Recent Results (from the past 240 hour(s))  MRSA PCR SCREENING     Status: Normal   Collection Time   11/20/11 10:30 AM      Component Value Range Status Comment   MRSA by PCR NEGATIVE  NEGATIVE  Final     Studies/Results: Ct Abdomen Pelvis W Contrast  11/22/2011  *RADIOLOGY REPORT*  Clinical Data: Chest pain.  Evaluate for potential cancer.  The patient had DVT.  CT ABDOMEN AND PELVIS WITH CONTRAST  Technique:  Multidetector CT imaging of the abdomen and pelvis was performed following the standard protocol during bolus administration of intravenous contrast.  Contrast: 80mL OMNIPAQUE IOHEXOL 300 MG/ML  SOLN  Comparison: CT of abdomen and pelvis 02/03/2011.  Findings:  Lung Bases: Extensive occlusive thrombus is noted throughout all aspects of the left lower lobe pulmonary arterial tree with associated large pulmonary infarction in the left lower lobe, and small amount of left-sided pleural effusion.  Some new atelectasis is now noted in the right lower lobe.  Abdomen/Pelvis:  The patient is status post cholecystectomy.  There is mild intrahepatic biliary ductal dilatation.  Common bile duct itself does not appear dilated.  No definite focal hepatic lesions are noted.  The appearance of the pancreas, spleen, bilateral adrenal glands and bilateral kidneys is unremarkable.  There is no ascites or pneumoperitoneum and no pathologic distension of bowel. No definite pathologic adenopathy identified within the abdomen or pelvis.  The uterus, bilateral ovaries and urinary bladder are unremarkable in appearance.  Musculoskeletal: There are no aggressive appearing lytic or blastic lesions noted in  the visualized portions of the skeleton.  The patient is status post PLIF at L3-L4.  IMPRESSION: 1.  No definitive evidence of malignancy within the abdomen or pelvis identified on today's examination. 2.  Persistent occlusive thrombus throughout the left lower lobe with large left  lower lobe pulmonary infarction.  There is now a small overlying left-sided pleural effusion. 3.  Mild intrahepatic biliary ductal dilatation.  Common bile duct does not appear dilated.  Correlation with LFTs may be warranted. 3.  Status post PLIF at L3-L4. 4.  Status post cholecystectomy.  Original Report Authenticated By: Florencia Reasons, M.D.    Medications: I have reviewed the patient's current medications. Scheduled Meds:    . famotidine  20 mg Oral BID  . HYDROmorphone  1 mg Oral QID  . medroxyPROGESTERone  20 mg Oral TID  . Warfarin - Pharmacist Dosing Inpatient   Does not apply q1800   Continuous Infusions:    . dextrose 5 % and 0.45% NaCl 75 mL/hr at 11/24/11 0744  . heparin 1,250 Units/hr (11/24/11 0744)   PRN Meds:.sodium chloride, albuterol, ALPRAZolam, HYDROmorphone (DILAUDID) injection, naloxone, ondansetron (ZOFRAN) IV, sodium chloride  Assessment/Plan: A: Left Lower Lobe PE- risk factors: NuvaRing, Endometriosis, limited mobility, smoking  Data:  CT 5/15: 1. Study is positive for extensive occlusive pulmonary embolus to the left lower lobe with left lower lobe pulmonary infarction, as detailed above. In addition, there is evidence to suggest potential clot within the inferior aspect of the right atrium.  2. Trace left-sided pleural effusion layering dependently.  ECHO 5/15 >>> NO RA clot, EF:65%  CXR: 5/16 bilateral atelectasis  LL Dopplers 5/15 >>> Negative  P:  -heparin drip/ begin coumadin bridge (4/5 days of overlap, and then 2 days therapeutic INR)  -continue supplemental O2 for sats >92%  - f/u Hypercoagulable work up once off coumadin -  CT pelvis r/o CA - negative for malignacy  A: Asthma, no exacerbation  P:  -PRN SABA  supplemental O2   A: Anemia- HCT drop s/p removal of NuvaRing; vaginal bleeding   P:  -Trend CBC - stable so far -Will transfuse if <7  -Appreciate Gyne rec's for short term provera in setting of anemia   A: Anxiety  P:    -Supportive care  -continue Xanax pRN   A: pleuritic left chest pain; over sedated today  P:  - stop PCA  - add percocet  And will dec IV dilaudid- PO dilaudid not helping  A: Heavy Vaginal bleeding s/p Nuvaring removal  P:  -GYN consult per Dr. Tamela Oddi - appreciate consult, Hgb stable- bleeding decreased -Provera 20mg  TID  Ask PT to see- home needs?   NEED TO FIND: PCP Who to follow the coumdain   LOS: 4 days  Zorawar Strollo, DO 11/24/2011, 9:51 AM

## 2011-11-24 NOTE — Progress Notes (Signed)
Pt ambulated 150 ft with walker with standby assistance. Pt tolerated ambulation well. Megan Chandler

## 2011-11-24 NOTE — Progress Notes (Signed)
Pt had order for Dilaudid 1 mg PO. 2 mg Dilaudid PO in pyxis. Administered 1 mg Dilaudid PO to patient. Wasted 1 mg Dilaudid PO in sharps container.   Myrle Sheng, RN

## 2011-11-24 NOTE — Progress Notes (Signed)
ANTICOAGULATION CONSULT NOTE - Follow Up Consult  Pharmacy Consult for heparin, Coumadin  Indication: pulmonary embolus LLL   Labs:  Basename 11/24/11 0543 11/23/11 0540 11/22/11 1259 11/22/11 0545  HGB 7.5* 7.4* -- --  HCT 25.4* 24.8* -- 24.9*  PLT 458* 450* -- 421*  APTT -- -- -- --  LABPROT 25.0* 23.3* -- 16.0*  INR 2.22* 2.03* -- 1.25  HEPARINUNFRC 0.43 0.37 0.55 --  CREATININE -- -- -- --  CKTOTAL -- -- -- --  CKMB -- -- -- --  TROPONINI -- -- -- --    Assessment: 36 yo female on Day#4/5 overlap of heparin / Coumadin for LLL pulmonary embolism (risk factors: NuvaRing, Endometriosis, limited mobility, smoking).  Today's Heparin level is therapeutic at 0.43 on IV heparin drip rate 1250 units/hr. INR =2.22 is therapeutic.  Coumadin dose was held yesterday dut to significant increase in INR from 1.25 to 2.03.  Hgb stable at 7.5, PLTC 458K. H/H has decreased from admit H/H 10.2/30; noted patient had heavy vaginal bleeding s/p removal of nuvaring. MD notes today that bleeding has decreased.    Goal of Therapy:  Heparin level 0.3-0.7 units/ml INR 2-3 Monitor platelets by anticoagulation protocol: Yes   Plan:  Continue IV heparin 1250 units/hr.  Coumadin 7.5mg  today.  Monitor Heparin level, INR and CBC qAM.   Noah Delaine, RPh Clinical Pharmacist 11/24/2011 3:01 PM

## 2011-11-25 DIAGNOSIS — N920 Excessive and frequent menstruation with regular cycle: Secondary | ICD-10-CM

## 2011-11-25 DIAGNOSIS — D509 Iron deficiency anemia, unspecified: Secondary | ICD-10-CM

## 2011-11-25 DIAGNOSIS — R079 Chest pain, unspecified: Secondary | ICD-10-CM

## 2011-11-25 DIAGNOSIS — I10 Essential (primary) hypertension: Secondary | ICD-10-CM

## 2011-11-25 LAB — PROTIME-INR
INR: 2.84 — ABNORMAL HIGH (ref 0.00–1.49)
Prothrombin Time: 30.3 seconds — ABNORMAL HIGH (ref 11.6–15.2)

## 2011-11-25 LAB — CBC
HCT: 26.7 % — ABNORMAL LOW (ref 36.0–46.0)
MCHC: 29.2 g/dL — ABNORMAL LOW (ref 30.0–36.0)
Platelets: 495 10*3/uL — ABNORMAL HIGH (ref 150–400)
RDW: 19.2 % — ABNORMAL HIGH (ref 11.5–15.5)

## 2011-11-25 LAB — HEPARIN LEVEL (UNFRACTIONATED): Heparin Unfractionated: 0.38 IU/mL (ref 0.30–0.70)

## 2011-11-25 MED ORDER — WARFARIN SODIUM 5 MG PO TABS
5.0000 mg | ORAL_TABLET | Freq: Once | ORAL | Status: AC
  Administered 2011-11-25: 5 mg via ORAL
  Filled 2011-11-25: qty 1

## 2011-11-25 MED ORDER — HYDROMORPHONE HCL PF 1 MG/ML IJ SOLN
0.5000 mg | Freq: Once | INTRAMUSCULAR | Status: AC
  Administered 2011-11-25: 0.5 mg via INTRAVENOUS
  Filled 2011-11-25: qty 1

## 2011-11-25 NOTE — Progress Notes (Signed)
ANTICOAGULATION CONSULT NOTE - Follow Up Consult  Pharmacy Consult for heparin, Coumadin  Indication: pulmonary embolus LLL   Labs:  Basename 11/25/11 0630 11/24/11 0543 11/23/11 0540  HGB 7.8* 7.5* --  HCT 26.7* 25.4* 24.8*  PLT 495* 458* 450*  APTT -- -- --  LABPROT 30.3* 25.0* 23.3*  INR 2.84* 2.22* 2.03*  HEPARINUNFRC 0.38 0.43 0.37  CREATININE -- -- --  CKTOTAL -- -- --  CKMB -- -- --  TROPONINI -- -- --    Assessment: 36 yo female on Day#5/5 overlap of heparin / Coumadin for LLL pulmonary embolism (risk factors: NuvaRing, Endometriosis, limited mobility, smoking).  Today's Heparin level is therapeutic at 0.38 on IV heparin drip rate 1250 units/hr. INR =2.84 is therapeutic.  Coumadin dose was held 5/18 due to significant increase in INR from 1.25 to 2.03.  Hgb stable at 7.8, PLTC 495K.    Goal of Therapy:  Heparin level 0.3-0.7 units/ml INR 2-3 Monitor platelets by anticoagulation protocol: Yes   Plan:  Continue IV heparin 1250 units/hr.  Coumadin 5mg  today.  Monitor Heparin level, INR and CBC qAM.   Talbert Cage, PharmD Clinical Pharmacist 11/25/2011 10:11 AM

## 2011-11-25 NOTE — Progress Notes (Signed)
Pt in chair and crying stating she is in extreme pain and wants to go to the ER. She just received scheduled pain medication at 1400. Obtained vitals signs all WNL and note 100% on RA. Notified MD of pt's extreme pain and obtained order of one time dose of dilaudid. Will give to pt and continue to monitor.

## 2011-11-25 NOTE — Progress Notes (Signed)
Subjective: Feeling better Up in chair Still seems overmedicated- ? Baseline flat affect/sleepiness  Objective: Vital signs in last 24 hours: Filed Vitals:   11/24/11 1427 11/24/11 2122 11/25/11 0448 11/25/11 0904  BP: 92/57 101/65 107/71   Pulse: 83 82 85 112  Temp: 97.9 F (36.6 C) 97.8 F (36.6 C) 97.7 F (36.5 C)   TempSrc: Oral Oral Oral   Resp: 20 20 20    Height:      Weight:      SpO2: 100% 100% 100% 99%   Weight change:   Intake/Output Summary (Last 24 hours) at 11/25/11 1022 Last data filed at 11/25/11 0800  Gross per 24 hour  Intake 2905.06 ml  Output    625 ml  Net 2280.06 ml    Physical Exam: General: Awake, Oriented, No acute distress- sleepy HEENT: EOMI. Neck: Supple CV: S1 and S2, rrr Lungs: Clear to ascultation bilaterally, no wheezing Abdomen: Soft, Nontender, Nondistended, +bowel sounds. Ext: Good pulses. Trace edema.   Lab Results: No results found for this basename: NA:2,K:2,CL:2,CO2:2,GLUCOSE:2,BUN:2,CREATININE:2,CALCIUM:2,MG:2,PHOS:2 in the last 72 hours No results found for this basename: AST:2,ALT:2,ALKPHOS:2,BILITOT:2,PROT:2,ALBUMIN:2 in the last 72 hours No results found for this basename: LIPASE:2,AMYLASE:2 in the last 72 hours  Basename 11/25/11 0630 11/24/11 0543  WBC 7.7 11.1*  NEUTROABS -- --  HGB 7.8* 7.5*  HCT 26.7* 25.4*  MCV 73.6* 72.4*  PLT 495* 458*   No results found for this basename: CKTOTAL:3,CKMB:3,CKMBINDEX:3,TROPONINI:3 in the last 72 hours No components found with this basename: POCBNP:3 No results found for this basename: DDIMER:2 in the last 72 hours No results found for this basename: HGBA1C:2 in the last 72 hours No results found for this basename: CHOL:2,HDL:2,LDLCALC:2,TRIG:2,CHOLHDL:2,LDLDIRECT:2 in the last 72 hours No results found for this basename: TSH,T4TOTAL,FREET3,T3FREE,THYROIDAB in the last 72 hours No results found for this basename: VITAMINB12:2,FOLATE:2,FERRITIN:2,TIBC:2,IRON:2,RETICCTPCT:2 in  the last 72 hours  Micro Results: Recent Results (from the past 240 hour(s))  MRSA PCR SCREENING     Status: Normal   Collection Time   11/20/11 10:30 AM      Component Value Range Status Comment   MRSA by PCR NEGATIVE  NEGATIVE  Final     Studies/Results: No results found.  Medications: I have reviewed the patient's current medications. Scheduled Meds:    . famotidine  20 mg Oral BID  . medroxyPROGESTERone  20 mg Oral TID  . oxyCODONE-acetaminophen  1 tablet Oral QID  . warfarin  5 mg Oral ONCE-1800  . warfarin  7.5 mg Oral ONCE-1800  . Warfarin - Pharmacist Dosing Inpatient   Does not apply q1800  . DISCONTD: HYDROmorphone  1 mg Oral QID   Continuous Infusions:    . dextrose 5 % and 0.45% NaCl 75 mL/hr at 11/25/11 0000  . heparin 1,250 Units/hr (11/25/11 0000)   PRN Meds:.sodium chloride, albuterol, ALPRAZolam, ondansetron (ZOFRAN) IV, DISCONTD:  HYDROmorphone (DILAUDID) injection, DISCONTD: naloxone, DISCONTD: sodium chloride  Assessment/Plan: A: Left Lower Lobe PE- risk factors: NuvaRing, Endometriosis, limited mobility, smoking  Data:  CT 5/15: 1. Study is positive for extensive occlusive pulmonary embolus to the left lower lobe with left lower lobe pulmonary infarction, as detailed above. In addition, there is evidence to suggest potential clot within the inferior aspect of the right atrium.  2. Trace left-sided pleural effusion layering dependently.  ECHO 5/15 >>> NO RA clot, EF:65%  CXR: 5/16 bilateral atelectasis  LL Dopplers 5/15 >>> Negative  P:  -heparin drip/ begin coumadin bridge (5/5 days of overlap, and then 2  days therapeutic INR)  -continue supplemental O2 for sats >92%  - f/u Hypercoagulable work up once off coumadin -  CT pelvis r/o CA - negative for malignacy  A: Asthma, no exacerbation  P:  -PRN SABA  supplemental O2   A: Anemia- HCT drop s/p removal of NuvaRing; vaginal bleeding   P:  -Trend CBC - stable so far -Will transfuse if <7    -Appreciate Gyne rec's for short term provera in setting of anemia   A: Anxiety  P:  -Supportive care  -continue Xanax pRN   A: pleuritic left chest pain; over sedated today  P:  - stop PCA  - add percocet  D/c IV dilaudid  A: Heavy Vaginal bleeding s/p Nuvaring removal  P:  -GYN consult per Dr. Tamela Oddi - appreciate consult, Hgb stable- bleeding decreased -Provera 20mg  TID    Patient and Mom plan to go back to [place]- told mom she needs to find PCP to follow coumadin by tomorrow so we can make arrangements for D/C   LOS: 5 days  Megan Engebretson, DO 11/25/2011, 10:22 AM

## 2011-11-25 NOTE — Evaluation (Signed)
Physical Therapy Evaluation Patient Details Name: Megan Chandler MRN: [id number] DOB: [birth year] Today's Date: 11/25/2011 Time: 0825-0904 PT Time Calculation (min): 39 min  PT Assessment / Plan / Recommendation Clinical Impression  Pt admitted with PE of LLL and infarct. Pt with flat affect and decreased motivation for mobility. Pt will benefit from acute therapy to maximize function and activity tolerance prior to discharge to decrease burden of care and maximize independence.    PT Assessment  Patient needs continued PT services    Follow Up Recommendations  Outpatient PT    Barriers to Discharge None      lEquipment Recommendations  None recommended by PT    Recommendations for Other Services     Frequency Min 3X/week    Precautions / Restrictions Precautions Precautions: Fall   Pertinent Vitals/Pain O2 sats 97-99 throughout on RA HR 94-122 throughout activity      Mobility  Bed Mobility Bed Mobility: Rolling Left;Left Sidelying to Sit;Sitting - Scoot to Edge of Bed Rolling Left: 6: Modified independent (Device/Increase time) Left Sidelying to Sit: 6: Modified independent (Device/Increase time);HOB flat Sitting - Scoot to Edge of Bed: 6: Modified independent (Device/Increase time) Transfers Transfers: Sit to Stand;Stand to Sit Sit to Stand: From toilet;From bed;5: Supervision Stand to Sit: 5: Supervision;To bed;To toilet Details for Transfer Assistance: cueing for hand placement Ambulation/Gait Ambulation/Gait Assistance: 4: Min guard Ambulation Distance (Feet): 225 Feet Assistive device: Rolling walker Ambulation/Gait Assistance Details: cueing for posture and to step into RW Gait Pattern: Decreased stride length Stairs: Yes Stairs Assistance: 4: Min guard Stairs Assistance Details (indicate cue type and reason): cueing for sequence due to pt reporting LLE is weaker than RLE Stair Management Technique: One rail Right Number of Stairs: 6     Exercises      PT Diagnosis: Abnormality of gait;Acute pain  PT Problem List: Decreased mobility;Decreased activity tolerance;Decreased balance;Decreased knowledge of use of DME;Pain PT Treatment Interventions: Gait training;Balance training;Functional mobility training;Therapeutic exercise;Patient/family education;Therapeutic activities   PT Goals Acute Rehab PT Goals PT Goal Formulation: With patient/family Time For Goal Achievement: 12/02/11 Potential to Achieve Goals: Fair Pt will go Sit to Stand: with modified independence PT Goal: Sit to Stand - Progress: Goal set today Pt will go Stand to Sit: with modified independence PT Goal: Stand to Sit - Progress: Goal set today Pt will Ambulate: >150 feet;with supervision;with rolling walker PT Goal: Ambulate - Progress: Goal set today Pt will Go Up / Down Stairs: 6-9 stairs;with modified independence;with rail(s) PT Goal: Up/Down Stairs - Progress: Goal set today  Visit Information  Last PT Received On: 11/25/11 Assistance Needed: +1    Subjective Data  Subjective: I'm disabled. "my baby is a lazy 17yo" Patient Stated Goal: take care of myself   Prior Functioning  Home Living Lives With: Alone Available Help at Discharge: Family;Available 24 hours/day Type of Home: House Home Access: Stairs to enter [employer] of Steps: 1 Home Layout: Two level Alternate Level Stairs-Number of Steps: 6 Alternate Level Stairs-Rails: Right Bathroom Shower/Tub: Engineer, manufacturing systems: Standard Home Adaptive Equipment: Walker - rolling Prior Function Level of Independence: Independent Able to Take Stairs?: Yes Driving: Yes Vocation: On disability Comments: Pt plans to go to [facility] in [place] where she will have 24hr assist Communication Communication: No difficulties    Cognition  Overall Cognitive Status: Appears within functional limits for tasks assessed/performed Arousal/Alertness: Awake/alert Orientation Level: Appears  intact for tasks assessed Behavior During Session: Flat affect    Extremity/Trunk Assessment  Right Upper Extremity Assessment RUE ROM/Strength/Tone: [facility] for tasks assessed Left Upper Extremity Assessment LUE ROM/Strength/Tone: [facility] for tasks assessed Right Lower Extremity Assessment RLE ROM/Strength/Tone: [facility] for tasks assessed Left Lower Extremity Assessment LLE ROM/Strength/Tone: WFL for tasks assessed   Balance    End of Session PT - End of Session Equipment Utilized During Treatment: Gait belt Activity Tolerance: Patient tolerated treatment well Patient left: in chair;with call bell/phone within reach;with family/visitor present Nurse Communication: Mobility status   Delorse Lek 11/25/2011, 9:10 AM  Delaney Meigs, PT [phone]

## 2011-11-25 NOTE — Progress Notes (Signed)
Pt still experiencing vaginal bleeding. Pt states, "I have been bleeding like this for two months." She stated that she filled a pad with blood in 10 minutes. RN checked pad and did have frank blood but was not saturated. Pad had approximately 3 inches wide of blood, did not cover whole pad. Pt appears weak and states she "just want to know why she keeps bleeding." Will continue to monitor. Lorretta Harp RN

## 2011-11-26 DIAGNOSIS — D509 Iron deficiency anemia, unspecified: Secondary | ICD-10-CM

## 2011-11-26 DIAGNOSIS — N920 Excessive and frequent menstruation with regular cycle: Secondary | ICD-10-CM

## 2011-11-26 DIAGNOSIS — R079 Chest pain, unspecified: Secondary | ICD-10-CM

## 2011-11-26 DIAGNOSIS — I10 Essential (primary) hypertension: Secondary | ICD-10-CM

## 2011-11-26 LAB — PROTIME-INR: Prothrombin Time: 28.6 seconds — ABNORMAL HIGH (ref 11.6–15.2)

## 2011-11-26 LAB — CBC
Hemoglobin: 8.1 g/dL — ABNORMAL LOW (ref 12.0–15.0)
MCH: 21.1 pg — ABNORMAL LOW (ref 26.0–34.0)
MCHC: 29 g/dL — ABNORMAL LOW (ref 30.0–36.0)
RDW: 19.2 % — ABNORMAL HIGH (ref 11.5–15.5)

## 2011-11-26 MED ORDER — ALBUTEROL SULFATE HFA 108 (90 BASE) MCG/ACT IN AERS: 2.0000 | INHALATION_SPRAY | RESPIRATORY_TRACT | Status: DC | PRN

## 2011-11-26 MED ORDER — OXYCODONE-ACETAMINOPHEN 5-325 MG PO TABS: 2.0000 | ORAL_TABLET | ORAL | Status: DC | PRN

## 2011-11-26 MED ORDER — MEDROXYPROGESTERONE ACETATE 10 MG PO TABS: 20.0000 mg | ORAL_TABLET | Freq: Three times a day (TID) | ORAL | Status: DC

## 2011-11-26 MED ORDER — ALPRAZOLAM 0.5 MG PO TABS: 0.5000 mg | ORAL_TABLET | Freq: Every evening | ORAL | Status: DC | PRN

## 2011-11-26 MED ORDER — FAMOTIDINE 20 MG PO TABS: 20.0000 mg | ORAL_TABLET | Freq: Two times a day (BID) | ORAL | Status: DC

## 2011-11-26 MED ORDER — WARFARIN SODIUM 6 MG PO TABS
6.0000 mg | ORAL_TABLET | Freq: Every day | ORAL | Status: AC
  Administered 2011-11-26: 6 mg via ORAL
  Filled 2011-11-26: qty 1

## 2011-11-26 MED ORDER — WARFARIN SODIUM 6 MG PO TABS: 6.0000 mg | ORAL_TABLET | Freq: Every day | ORAL | Status: DC

## 2011-11-26 MED ORDER — OXYCODONE-ACETAMINOPHEN 5-325 MG PO TABS
2.0000 | ORAL_TABLET | ORAL | Status: DC | PRN
  Administered 2011-11-26 (×2): 2 via ORAL
  Filled 2011-11-26 (×2): qty 2

## 2011-11-26 NOTE — Progress Notes (Addendum)
Subjective: 36 yo female admitted to ICU with PE- transferred to floor where coumadin/heparin was given- patient is now therapeutic.  Plans to go to [place]- needs PCP for coumadin management- and gyn for DUB management.     Feeling better C/o pain worse with breathing   Objective: Vital signs in last 24 hours: Filed Vitals:   11/25/11 0904 11/25/11 1401 11/25/11 2038 11/26/11 0505  BP:  109/66 98/65 97/65   Pulse: 112 90 88 82  Temp:  98.3 F (36.8 C) 97.7 F (36.5 C) 98.3 F (36.8 C)  TempSrc:  Oral Oral Oral  Resp:  18 19 18   Height:      Weight:      SpO2: 99% 99% 100% 99%   Weight change:   Intake/Output Summary (Last 24 hours) at 11/26/11 1237 Last data filed at 11/26/11 0600  Gross per 24 hour  Intake 1672.5 ml  Output   1300 ml  Net  372.5 ml    Physical Exam: General: Awake, Oriented, No acute distress- sleepy HEENT: EOMI. Neck: Supple CV: S1 and S2, rrr Lungs: Clear to ascultation bilaterally, no wheezing Abdomen: Soft, Nontender, Nondistended, +bowel sounds. Ext: Good pulses. Trace edema.   Lab Results: No results found for this basename: NA:2,K:2,CL:2,CO2:2,GLUCOSE:2,BUN:2,CREATININE:2,CALCIUM:2,MG:2,PHOS:2 in the last 72 hours No results found for this basename: AST:2,ALT:2,ALKPHOS:2,BILITOT:2,PROT:2,ALBUMIN:2 in the last 72 hours No results found for this basename: LIPASE:2,AMYLASE:2 in the last 72 hours  Basename 11/26/11 0541 11/25/11 0630  WBC 8.2 7.7  NEUTROABS -- --  HGB 8.1* 7.8*  HCT 27.9* 26.7*  MCV 72.7* 73.6*  PLT 572* 495*   No results found for this basename: CKTOTAL:3,CKMB:3,CKMBINDEX:3,TROPONINI:3 in the last 72 hours No components found with this basename: POCBNP:3 No results found for this basename: DDIMER:2 in the last 72 hours No results found for this basename: HGBA1C:2 in the last 72 hours No results found for this basename: CHOL:2,HDL:2,LDLCALC:2,TRIG:2,CHOLHDL:2,LDLDIRECT:2 in the last 72 hours No results found for this  basename: TSH,T4TOTAL,FREET3,T3FREE,THYROIDAB in the last 72 hours No results found for this basename: VITAMINB12:2,FOLATE:2,FERRITIN:2,TIBC:2,IRON:2,RETICCTPCT:2 in the last 72 hours  Micro Results: Recent Results (from the past 240 hour(s))  MRSA PCR SCREENING     Status: Normal   Collection Time   11/20/11 10:30 AM      Component Value Range Status Comment   MRSA by PCR NEGATIVE  NEGATIVE  Final     Studies/Results: No results found.  Medications: I have reviewed the patient's current medications. Scheduled Meds:    . famotidine  20 mg Oral BID  .  HYDROmorphone (DILAUDID) injection  0.5 mg Intravenous Once  . medroxyPROGESTERone  20 mg Oral TID  . warfarin  5 mg Oral ONCE-1800  . warfarin  6 mg Oral q1800  . Warfarin - Pharmacist Dosing Inpatient   Does not apply q1800  . DISCONTD: oxyCODONE-acetaminophen  1 tablet Oral QID   Continuous Infusions:    . DISCONTD: dextrose 5 % and 0.45% NaCl 75 mL/hr at 11/25/11 2249  . DISCONTD: heparin 1,250 Units/hr (11/25/11 2125)   PRN Meds:.sodium chloride, albuterol, ALPRAZolam, ondansetron (ZOFRAN) IV, oxyCODONE-acetaminophen  Assessment/Plan: A: Left Lower Lobe PE- risk factors: NuvaRing, Endometriosis, limited mobility, smoking  Data:  CT 5/15: 1. Study is positive for extensive occlusive pulmonary embolus to the left lower lobe with left lower lobe pulmonary infarction, as detailed above. In addition, there is evidence to suggest potential clot within the inferior aspect of the right atrium.  2. Trace left-sided pleural effusion layering dependently.  ECHO 5/15 >>>  NO RA clot, EF:65%  CXR: 5/16 bilateral atelectasis  LL Dopplers 5/15 >>> Negative  P:  -heparin drip/ begin coumadin bridge (5/5 days of overlap, and then 2 days therapeutic INR)  -continue supplemental O2 for sats >92%  - f/u Hypercoagulable work up once off coumadin -  CT pelvis r/o CA - negative for malignacy  A: Asthma, no exacerbation  P:  -PRN SABA    supplemental O2   A: Anemia- HCT drop s/p removal of NuvaRing; vaginal bleeding   P:  -Trend CBC - stable so far -Will transfuse if <7  -Appreciate Gyne rec's for short term provera in setting of anemia   A: Anxiety  P:  -Supportive care  -continue Xanax pRN   A: pleuritic left chest pain; over sedated today  P:  - stop PCA  - add percocet  D/c IV dilaudid  A: Heavy Vaginal bleeding s/p Nuvaring removal  P:  -GYN consult per Dr. Tamela Oddi - appreciate consult, Hgb stable- bleeding decreased -Provera 20mg  TID     Patient and Mom plan to go back to [place]- told mom she needs to find PCP to follow coumadin so we can make arrangements for D/C- called Mom, no answer-plan to D/C tomm once outpatient arrangements for coumadin and gyn follow up  Patient requested letter- placed in box    LOS: 6 days  Seaborn Nakama, DO 11/26/2011, 12:37 PM

## 2011-11-26 NOTE — Discharge Summary (Signed)
Discharge Summary  Megan Chandler MR#: [id number]  DOB:May 17, 1976  Date of Admission: 11/20/2011 Date of Discharge: 11/26/2011  Patient's PCP: Genella Mech, MD, MD  Attending Physician:Hattie Pine  Consults:   Critical care Gynecology  Discharge Diagnoses: Active Problems:  Pulmonary embolism with infarction  Chest pain  Anxiety  Anemia  Endometriosis  Asthma  Active smoker   Brief Admitting History and Physical 36 yo AA female present to [facility] ED on 5/15 for acute left sided chest pain, and shortness of breath. Patient was awakened from sleep at 5am with symptoms and unable to fall back asleep. Symptoms continued to get worse and EMS was called. In January 2013 patient had lower back surgery and has been less mobile since, and a 6 month history of unintentional weight fluctuation. Patient uses NuvaRing for heavy vaginal bleeding and endometriosis. Reports smoking 2 black and milds daily. CT scan in ED shows large left lower lobe PE, and possible RA clot. PCCM asked to admit   Discharge Medications Medication List  As of 11/26/2011  4:36 PM   TAKE these medications         albuterol 108 (90 BASE) MCG/ACT inhaler   Commonly known as: PROVENTIL HFA;VENTOLIN HFA   Inhale 2 puffs into the lungs every 4 (four) hours as needed for wheezing or shortness of breath.      ALPRAZolam 0.5 MG tablet   Commonly known as: XANAX   Take 1 tablet (0.5 mg total) by mouth at bedtime as needed for anxiety.      famotidine 20 MG tablet   Commonly known as: PEPCID   Take 1 tablet (20 mg total) by mouth 2 (two) times daily.      medroxyPROGESTERone 10 MG tablet   Commonly known as: PROVERA   Take 2 tablets (20 mg total) by mouth 3 (three) times daily.      oxyCODONE-acetaminophen 5-325 MG per tablet   Commonly known as: PERCOCET   Take 2 tablets by mouth every 4 (four) hours as needed.      warfarin 6 MG tablet   Commonly known as: COUMADIN   Take 1 tablet (6 mg total) by mouth  daily at 6 PM.            Hospital Course:  Left Lower Lobe PE- risk factors: NuvaRing, Endometriosis, limited mobility, smoking  Data:  CT 5/15: 1. Study is positive for extensive occlusive pulmonary embolus to the left lower lobe with left lower lobe pulmonary infarction, as detailed above. In addition, there is evidence to suggest potential clot within the inferior aspect of the right atrium.  2. Trace left-sided pleural effusion layering dependently.  ECHO 5/15 >>> NO RA clot, EF:65%  CXR: 5/16 bilateral atelectasis  LL Dopplers 5/15 >>> Negative  -heparin to coumadin - f/u Hypercoagulable work up once off coumadin  - CT pelvis r/o CA - negative for malignacy    Asthma, no exacerbation  -PRN SABA     Anemia- HCT drop s/p removal of NuvaRing; vaginal bleeding  -CBC stable will need close follow up     Anxiety   -Supportive care  -continue Xanax pRN    pleuritic left chest pain -PRN percocet   - A: Heavy Vaginal bleeding s/p Nuvaring removal  -GYN consult per Dr. Tamela Oddi  -Provera 20mg  TID Close outpatient follow up     Day of Discharge BP 105/73  Pulse 100  Temp(Src) 98 F (36.7 C) (Oral)  Resp 18  Ht 5\' 6"  (1.676 m)  Wt 63.3 kg (139 lb 8.8 oz)  BMI 22.52 kg/m2  SpO2 100%  LMP 11/20/2011 See progress note from 5/21 for exam  Results for orders placed during the hospital encounter of 11/20/11 (from the past 48 hour(s))  HEPARIN LEVEL (UNFRACTIONATED)     Status: Normal   Collection Time   11/25/11  6:30 AM      Component Value Range Comment   Heparin Unfractionated 0.38  0.30 - 0.70 (IU/mL)   CBC     Status: Abnormal   Collection Time   11/25/11  6:30 AM      Component Value Range Comment   WBC 7.7  4.0 - 10.5 (K/uL)    RBC 3.63 (*) 3.87 - 5.11 (MIL/uL)    Hemoglobin 7.8 (*) 12.0 - 15.0 (g/dL)    HCT 29.5 (*) 62.1 - 46.0 (%)    MCV 73.6 (*) 78.0 - 100.0 (fL)    MCH 21.5 (*) 26.0 - 34.0 (pg)    MCHC 29.2 (*) 30.0 - 36.0 (g/dL)    RDW 30.8  (*) 65.7 - 15.5 (%)    Platelets 495 (*) 150 - 400 (K/uL)   PROTIME-INR     Status: Abnormal   Collection Time   11/25/11  6:30 AM      Component Value Range Comment   Prothrombin Time 30.3 (*) 11.6 - 15.2 (seconds)    INR 2.84 (*) 0.00 - 1.49    HEPARIN LEVEL (UNFRACTIONATED)     Status: Normal   Collection Time   11/26/11  5:41 AM      Component Value Range Comment   Heparin Unfractionated 0.44  0.30 - 0.70 (IU/mL)   CBC     Status: Abnormal   Collection Time   11/26/11  5:41 AM      Component Value Range Comment   WBC 8.2  4.0 - 10.5 (K/uL)    RBC 3.84 (*) 3.87 - 5.11 (MIL/uL)    Hemoglobin 8.1 (*) 12.0 - 15.0 (g/dL)    HCT 84.6 (*) 96.2 - 46.0 (%)    MCV 72.7 (*) 78.0 - 100.0 (fL)    MCH 21.1 (*) 26.0 - 34.0 (pg)    MCHC 29.0 (*) 30.0 - 36.0 (g/dL)    RDW 95.2 (*) 84.1 - 15.5 (%)    Platelets 572 (*) 150 - 400 (K/uL)   PROTIME-INR     Status: Abnormal   Collection Time   11/26/11  5:41 AM      Component Value Range Comment   Prothrombin Time 28.6 (*) 11.6 - 15.2 (seconds)    INR 2.64 (*) 0.00 - 1.49      Ct Angio Chest W/cm &/or Wo Cm  11/20/2011  *RADIOLOGY REPORT*  Clinical Data: Left sided pleuritic chest pain with severe shortness of breath.  Recent history of back surgery.  CT ANGIOGRAPHY CHEST  Technique:  Multidetector CT imaging of the chest using the standard protocol during bolus administration of intravenous contrast. Multiplanar reconstructed images including MIPs were obtained and reviewed to evaluate the vascular anatomy.  Contrast: OMNIPAQUE IOHEXOL 350 MG/ML SOLN  Comparison: Chest CT 07/26/2011.  Findings:  Mediastinum: There is a large burden of occlusive thrombus in the distal left main pulmonary artery extending into the left lower lobe lobar, segmental and subsegmental sized branches (which all appear completely occluded).  At this time, there does not appear to be dilatation of the pulmonic trunk, or frank right-sided heart dilatation to strongly suggest  presence of pulmonary arterial hypertension  or right-sided heart strain. Images 52 - 56 of series 5 demonstrated a potential filling defect within the inferior aspect of the right atrium which may represent additional right- sided thrombus.  Heart size is borderline enlarged. There is no significant pericardial fluid, thickening or pericardial calcification. No pathologically enlarged mediastinal or hilar lymph nodes. Esophagus is unremarkable in appearance.  Lungs/Pleura: Multiple areas of ground-glass attenuation throughout the basal segments of the left lower lobe, likely reflect areas of parenchymal hemorrhage from underlying pulmonary infarction.  This is most pronounced in the anterobasal segment, and where there is a wedge-shaped pleural-based opacity, compatible with pulmonary infarction.  No definite suspicious appearing pulmonary nodules or masses are identified.  A trace left-sided pleural effusion layering dependently noted at this time.  A small subpleural bleb in the right apex incidentally noted.  Upper Abdomen: Unremarkable.  Musculoskeletal: There are no aggressive appearing lytic or blastic lesions noted in the visualized portions of the skeleton. Postoperative changes of ACDF of the lower cervical spine are incompletely visualized.  IMPRESSION: 1.  Study is positive for extensive occlusive pulmonary embolus to the left lower lobe with left lower lobe pulmonary infarction, as detailed above.  In addition, there is evidence to suggest potential clot within the inferior aspect of the right atrium. 2. Trace left-sided pleural effusion layering dependently.  Critical Value/emergent results were called by telephone at the time of interpretation on 11/20/2011  at 07:45 a.m.  to  Dr. Clarene Duke, who verbally acknowledged these results.  Original Report Authenticated By: Florencia Reasons, M.D.   Ct Abdomen Pelvis W Contrast  11/22/2011  *RADIOLOGY REPORT*  Clinical Data: Chest pain.  Evaluate for  potential cancer.  The patient had DVT.  CT ABDOMEN AND PELVIS WITH CONTRAST  Technique:  Multidetector CT imaging of the abdomen and pelvis was performed following the standard protocol during bolus administration of intravenous contrast.  Contrast: 80mL OMNIPAQUE IOHEXOL 300 MG/ML  SOLN  Comparison: CT of abdomen and pelvis 02/03/2011.  Findings:  Lung Bases: Extensive occlusive thrombus is noted throughout all aspects of the left lower lobe pulmonary arterial tree with associated large pulmonary infarction in the left lower lobe, and small amount of left-sided pleural effusion.  Some new atelectasis is now noted in the right lower lobe.  Abdomen/Pelvis:  The patient is status post cholecystectomy.  There is mild intrahepatic biliary ductal dilatation.  Common bile duct itself does not appear dilated.  No definite focal hepatic lesions are noted.  The appearance of the pancreas, spleen, bilateral adrenal glands and bilateral kidneys is unremarkable.  There is no ascites or pneumoperitoneum and no pathologic distension of bowel. No definite pathologic adenopathy identified within the abdomen or pelvis.  The uterus, bilateral ovaries and urinary bladder are unremarkable in appearance.  Musculoskeletal: There are no aggressive appearing lytic or blastic lesions noted in the visualized portions of the skeleton.  The patient is status post PLIF at L3-L4.  IMPRESSION: 1.  No definitive evidence of malignancy within the abdomen or pelvis identified on today's examination. 2.  Persistent occlusive thrombus throughout the left lower lobe with large left lower lobe pulmonary infarction.  There is now a small overlying left-sided pleural effusion. 3.  Mild intrahepatic biliary ductal dilatation.  Common bile duct does not appear dilated.  Correlation with LFTs may be warranted. 3.  Status post PLIF at L3-L4. 4.  Status post cholecystectomy.  Original Report Authenticated By: Florencia Reasons, M.D.   Dg Chest Port 1  View  11/21/2011  *  RADIOLOGY REPORT*  Clinical Data: Shortness of breath  PORTABLE CHEST - 1 VIEW  Comparison: CT chest of 11/20/2011 and chest x-ray of 07/16/2011  Findings: The lungs are poorly aerated with bibasilar atelectasis and effusions.  Areas of infarction may be present in view of the recent diagnosis of large pulmonary emboli.  The heart is mildly enlarged.  A lower anterior cervical spine fusion plate is present.  IMPRESSION: Poor aeration with bibasilar opacities consistent with atelectasis, effusions, possibly infarction.  Original Report Authenticated By: Juline Patch, M.D.     Disposition: home  Diet: coumadin  Activity: as tolerated   Follow-up Appts: Discharge Orders    Future Appointments: Provider: Department: Dept Phone: Center:   11/28/2011 9:45 AM Judie Bonus, MD [facility] [phone] [facility]     Future Orders Please Complete By Expires   Increase activity slowly      Discharge instructions      Comments:   Pt/INR on Thursday- 9:45 in internal medicine clinic with Dr. Dorise Hiss Coumadin diet   Discharge instructions      Comments:   CBC in 2 weeks re: Hgb      TESTS THAT NEED FOLLOW-UP Repeat CBC Coagulation work up once off coumadin   Time spent on discharge, talking to the patient, and coordinating care: 55 mins.   SignedMarlin Canary, DO 11/26/2011, 4:36 PM

## 2011-11-26 NOTE — Progress Notes (Signed)
Physical Therapy Treatment Patient Details Name: Megan Chandler MRN: [id number] DOB: 16-Nov-1975 Today's Date: 11/26/2011 Time: 4098-1191 PT Time Calculation (min): 19 min  PT Assessment / Plan / Recommendation Comments on Treatment Session  Pt admitted with PE and infarct with progressive ambulation today and more interactive today. Pt and mom encouraged to perform frequent stops during drive home and continue mobility.    Follow Up Recommendations       Barriers to Discharge        Equipment Recommendations       Recommendations for Other Services    Frequency     Plan Discharge plan remains appropriate;Frequency remains appropriate    Precautions / Restrictions Precautions Precautions: Fall   Pertinent Vitals/Pain Pain stated as 8/10 premedicated, pt joking and doesn't demonstrate outward signs of pain    Mobility  Bed Mobility Bed Mobility: Supine to Sit;Sitting - Scoot to Edge of Bed Supine to Sit: 6: Modified independent (Device/Increase time) Sitting - Scoot to Edge of Bed: 6: Modified independent (Device/Increase time) Transfers Transfers: Sit to Stand;Stand to Sit Sit to Stand: 6: Modified independent (Device/Increase time);From bed Stand to Sit: 6: Modified independent (Device/Increase time);To chair/3-in-1;With armrests Ambulation/Gait Ambulation/Gait Assistance: 5: Supervision Ambulation Distance (Feet): 350 Feet Assistive device: Rolling walker Ambulation/Gait Assistance Details: cueing for posture and increased stride. Step length just short of foot length with shuffle gait  Gait Pattern: Decreased stride length Gait velocity: decreased    Exercises     PT Diagnosis:    PT Problem List:   PT Treatment Interventions:     PT Goals Acute Rehab PT Goals PT Goal: Sit to Stand - Progress: Met PT Goal: Stand to Sit - Progress: Met Pt will Ambulate: with modified independence;with rolling walker;>150 feet PT Goal: Ambulate - Progress: Updated due to goal  met PT Goal: Up/Down Stairs - Progress: Progressing toward goal  Visit Information  Last PT Received On: 11/26/11 Assistance Needed: +1    Subjective Data  Subjective: i eat hamburgers and fries every chance I get   Cognition  Overall Cognitive Status: Appears within functional limits for tasks assessed/performed Arousal/Alertness: Awake/alert Orientation Level: Appears intact for tasks assessed Behavior During Session: [facility] for tasks performed    Balance     End of Session PT - End of Session Equipment Utilized During Treatment: Gait belt Activity Tolerance: Patient tolerated treatment well Patient left: in chair;with call bell/phone within reach;with family/visitor present Nurse Communication: Mobility status    Delorse Lek 11/26/2011, 3:51 PM Megan Chandler, PT [phone]

## 2011-11-26 NOTE — Progress Notes (Signed)
ANTICOAGULATION CONSULT NOTE - Follow Up Consult  Pharmacy Consult for heparin, Coumadin  Indication: pulmonary embolus LLL   Labs:  Basename 11/26/11 0541 11/25/11 0630 11/24/11 0543  HGB 8.1* 7.8* --  HCT 27.9* 26.7* 25.4*  PLT 572* 495* 458*  APTT -- -- --  LABPROT 28.6* 30.3* 25.0*  INR 2.64* 2.84* 2.22*  HEPARINUNFRC 0.44 0.38 0.43  CREATININE -- -- --  CKTOTAL -- -- --  CKMB -- -- --  TROPONINI -- -- --    Assessment: 36 yo female on Day#6/5 overlap of heparin / Coumadin for LLL pulmonary embolism (risk factors: NuvaRing, Endometriosis, limited mobility, smoking).  Today's Heparin level is therapeutic at 0.44 on IV heparin drip rate 1250 units/hr. INR =2.64 is therapeutic.  Coumadin dose was held 5/18 due to significant increase in INR from 1.25 to 2.03.  Hgb 8.1, PLTC 572K. Vaginal bleeding continues.   Goal of Therapy:  Heparin level 0.3-0.7 units/ml INR 2-3 Monitor platelets by anticoagulation protocol: Yes   Plan:  Continue IV heparin 1250 units/hr. OK to dc as has completed 6 days of heparin and at least 2 days therapeutic overlap INR. Coumadin 6mg  daily. Monitor Heparin level, INR and CBC qAM.   Talbert Cage, PharmD Clinical Pharmacist 11/26/2011 9:01 AM

## 2011-11-26 NOTE — Discharge Instructions (Signed)
Vitamin K and Warfarin Warfarin is a drug that helps thin your blood. If you take warfarin, you will need to follow a diet that has a consistent amount of vitamin K-containing foods. Sudden changes in the amount of vitamin K that you eat can cause the medicine to not work as well as it should. You do not need to avoid vitamin K-containing foods. FOODS HIGH IN VITAMIN K  Broccoli, fresh or frozen, cooked, 1 cup   Greens, fresh or frozen, cooked (beet, collard, mustard, turnip),  cup   Kale, fresh or frozen, cooked,  cup   Parsley, raw, 10 sprigs   Spinach, frozen or canned, cooked,  cup  FOODS MODERATELY HIGH IN VITAMIN K  Bok choy, cooked, 1 cup   Broccoli, raw, 1 cup   Brussels sprouts, fresh or frozen, cooked,  cup   Cabbage, cooked, 1 cup   Endive, raw, 1 cup   Green leaf lettuce, raw, 1 cup   Green scallions, raw,  cup   Okra, frozen, cooked, 1 cup   Romaine lettuce, raw, 1 cup   Sauerkraut, canned, 1 cup   Spinach, raw, 1 cup  KEEPING YOUR INTAKE CONSISTENT  Note the foods that are high in vitamin K listed above. How many times per week do you eat these foods?   For example, you mighteat cooked broccoli 1 time a week and a leafy green salad 3 times a week. In that case, you should have 1 high vitamin K food each week and 3 moderately high foods each week.   Remember, you do not need to eat the same foods each week. Try to keep your vitamin K intake levels about the same.  MORE TIPS  Take your warfarin as instructed.   It is okay to take a multivitamin that contains vitamin K. Just be sure to take it every day.   Avoid taking herbs unless approved by your caregiver.   Discuss any supplements or whole food supplements with your pharmacist.   If you drink alcohol, drink only in moderation while taking warfarin. If approved by your caregiver, have only 1 to 2 drinks per day. One drink = 5 oz of wine, 12 oz of beer, or 1  oz of hard liquor.  Document  Released: 04/21/2009 Document Revised: 06/13/2011 Document Reviewed: 04/21/2009 [employer] Patient Information 2012 [employer].    

## 2011-11-27 NOTE — Plan of Care (Signed)
Problem: Consults Goal: Diagnosis - Venous Thromboembolism (VTE) Choose a selection Outcome: Completed/Met Date Met:  11/26/11 PE (Pulmonary Embolism)

## 2011-11-28 ENCOUNTER — Encounter: Payer: Self-pay | Admitting: Internal Medicine

## 2011-11-28 ENCOUNTER — Ambulatory Visit (INDEPENDENT_AMBULATORY_CARE_PROVIDER_SITE_OTHER): Payer: Medicaid Other | Admitting: Internal Medicine

## 2011-11-28 VITALS — BP 135/85 | HR 106 | Temp 97.0°F | Wt 140.6 lb

## 2011-11-28 DIAGNOSIS — Z7901 Long term (current) use of anticoagulants: Secondary | ICD-10-CM

## 2011-11-28 DIAGNOSIS — N921 Excessive and frequent menstruation with irregular cycle: Secondary | ICD-10-CM

## 2011-11-28 DIAGNOSIS — D649 Anemia, unspecified: Secondary | ICD-10-CM

## 2011-11-28 DIAGNOSIS — I2699 Other pulmonary embolism without acute cor pulmonale: Secondary | ICD-10-CM

## 2011-11-28 DIAGNOSIS — J45909 Unspecified asthma, uncomplicated: Secondary | ICD-10-CM

## 2011-11-28 DIAGNOSIS — N920 Excessive and frequent menstruation with regular cycle: Secondary | ICD-10-CM

## 2011-11-28 DIAGNOSIS — F17200 Nicotine dependence, unspecified, uncomplicated: Secondary | ICD-10-CM

## 2011-11-28 LAB — CBC
Hemoglobin: 8.4 g/dL — ABNORMAL LOW (ref 12.0–15.0)
MCH: 20.8 pg — ABNORMAL LOW (ref 26.0–34.0)
MCHC: 29.4 g/dL — ABNORMAL LOW (ref 30.0–36.0)
MCV: 71 fL — ABNORMAL LOW (ref 78.0–100.0)
Platelets: 694 10*3/uL — ABNORMAL HIGH (ref 150–400)
RBC: 4.03 MIL/uL (ref 3.87–5.11)

## 2011-11-28 MED ORDER — WARFARIN SODIUM 5 MG PO TABS: 5.0000 mg | ORAL_TABLET | Freq: Every day | ORAL | Status: DC

## 2011-11-28 MED ORDER — FAMOTIDINE 20 MG PO TABS: 20.0000 mg | ORAL_TABLET | Freq: Two times a day (BID) | ORAL | Status: DC

## 2011-11-28 MED ORDER — MEDROXYPROGESTERONE ACETATE 10 MG PO TABS: 20.0000 mg | ORAL_TABLET | Freq: Three times a day (TID) | ORAL | Status: DC

## 2011-11-28 MED ORDER — BUDESONIDE-FORMOTEROL FUMARATE 80-4.5 MCG/ACT IN AERO: 2.0000 | INHALATION_SPRAY | Freq: Two times a day (BID) | RESPIRATORY_TRACT | Status: DC

## 2011-11-28 MED ORDER — ALPRAZOLAM 0.5 MG PO TABS: 0.5000 mg | ORAL_TABLET | Freq: Every evening | ORAL | Status: AC | PRN

## 2011-11-28 MED ORDER — OXYCODONE-ACETAMINOPHEN 5-325 MG PO TABS: 2.0000 | ORAL_TABLET | ORAL | Status: AC | PRN

## 2011-11-28 NOTE — Patient Instructions (Addendum)
You were seen today to meet your new doctor, Dr. Dorise Hiss, and your new doctor's office. We would like you to remember to take your coumadin (warfarin) the same time every day. Also remember to take your provera three times each day. We are changing you to a 5 mg coumadin (warfarin) pill. Take 5 mg (one pill) everyday. You NEED to see a doctor next Wednesday or Thursday to get your level checked. Our number is [phone] if you have any questions or problems. When you are getting ready to come home from [place] please call our office for an appointment.   Pulmonary Embolus A pulmonary (lung) embolus (PE) is a blood clot that has traveled from another place in the body to the lung. Most clots come from deep veins in the legs or pelvis. PE is a dangerous and potentially life-threatening condition that can be treated if identified. CAUSES Blood clots form in a vein for different reasons. Usually several things cause blood clots. They include:  The flow of blood slows down.   The inside of the vein is damaged in some way.   The person has a condition that makes the blood clot more easily. These conditions may include:   Older age (especially over 76 years old).   Having a history of blood clots.   Having major or lengthy surgery. Hip surgery is particularly high-risk.   Breaking a hip or leg.   Sitting or lying still for a long time.   Cancer or cancer treatment.   Having a long, thin tube (catheter) placed inside a vein during a medical procedure.   Being overweight (obese).   Pregnancy and childbirth.   Medicines with estrogen.   Smoking.   Other circulation or heart problems.  SYMPTOMS  The symptoms of a PE usually start suddenly and include:  Shortness of breath.   Coughing.   Coughing up blood or blood-tinged mucus (phlegm).   Chest pain. Pain is often worse with deep breaths.   Rapid heartbeat.  DIAGNOSIS  If a PE is suspected, your caregiver will take a medical  history and carry out a physical exam. Your caregiver will check for the risk factors listed above. Tests that also may be required include:  Blood tests, including studies of the clotting properties of your blood.   Imaging tests. Ultrasound, CT, MRI, and other tests can all be used to see if you have clots in your legs or lungs. If you have a clot in your legs and have breathing or chest problems, your caregiver may conclude that you have a clot in your lungs. Further lung tests may not be needed.   An EKG can look for heart strain from blood clots in the lungs.  PREVENTION   Exercise the legs regularly. Take a brisk 30 minute walk every day.   Maintain a weight that is appropriate for your height.   Avoid sitting or lying in bed for long periods of time without moving your legs.   Women, particularly those over the age of 75, should consider the risks and benefits of taking estrogen medicines, including birth control pills.   Do not smoke, especially if you take estrogen medicines.   Long-distance travel can increase your risk. You should exercise your legs by walking or pumping the muscles every hour.   In hospital prevention:   Your caregiver will assess your need for preventive PE care (prophylaxis) when you are admitted to the hospital. If you are having surgery, your surgeon  will assess you the day of or day after surgery.   Prevention may include medical and nonmedical measures.  TREATMENT   The most common treatment for a PE is blood thinning (anticoagulant) medicine, which reduces the blood's tendency to clot. Anticoagulants can stop new blood clots from forming and old ones from growing. They cannot dissolve existing clots. Your body does this by itself over time. Anticoagulants can be given by mouth, by intravenous (IV) access, or by injection. Your caregiver will determine the best program for you.   Less commonly, clot-dissolving drugs (thrombolytics) are used to dissolve  a PE. They carry a high risk of bleeding, so they are used mainly in severe cases.   Very rarely, a blood clot in the leg needs to be removed surgically.   If you are unable to take anticoagulants, your caregiver may arrange for you to have a filter placed in a main vein in your belly (abdomen). This filter prevents clots from traveling to your lungs.  HOME CARE INSTRUCTIONS   Take all medicines prescribed by your caregiver. Follow the directions carefully.   You will most likely continue taking anticoagulants after you leave the hospital. Your caregiver will advise you on the length of treatment (usually 3 to 6 months, sometimes for life).   Taking too much or too little of an anticoagulant is dangerous. While taking this type of medicine, you will need to have regular blood tests to be sure the dose is correct. The dose can change for many reasons. It is critically important that you take this medicine exactly as prescribed and that you have blood tests exactly as directed.   Many foods can interfere with anticoagulants. These include foods high in vitamin K, such as spinach, kale, broccoli, cabbage, collard and turnip greens, Brussels sprouts, peas, cauliflower, seaweed, parsley, beef and pork liver, green tea, and soybean oil. Your caregiver should discuss limits on these foods with you or you should arrange a visit with a dietician to answer your questions.   Many medicines can interfere with anticoagulants. You must tell your caregiver about any and all medicines you take. This includesall vitamins and supplements. Be especially cautious with aspirin and anti-inflammatory medicines. Ask your caregiver before taking these.   Anticoagulants can have side effects, mostly excessive bruising or bleeding. You will need to hold pressure over cuts for longer than usual. Avoid alcoholic drinks or consume only very small amounts while taking this medicine.   If you are taking an anticoagulant:    Wear a medical alert bracelet.   Notify your dentist or other caregivers before procedures.   Avoid contact sports.   Ask your caregiver how soon you can go back to normal activities. Not being active can lead to new clots. Ask for a list of what you should and should not do.   Exercise your lower leg muscles. This is important while traveling.   You may need to wear compression stockings. These are tight elastic stockings that apply pressure to the lower legs. This can help keep the blood in the legs from clotting.   If you are a smoker, you should quit.   Learn as much as you can about pulmonary embolisms.  SEEK MEDICAL CARE IF:   You notice a rapid heartbeat.   You feel weaker or more tired than usual.   You feel faint.   You notice increased bruising.   Your symptoms are not getting better in the time expected.   You are  having side effects of medicine.   You have an oral temperature above 102 F (38.9 C).   You discover other family members with blood clots. This may require further testing for inherited diseases or conditions.  SEEK IMMEDIATE MEDICAL CARE IF:   You have chest pain.   You have trouble breathing.   You have new or increased swelling or pain in one leg.   You cough up blood.   You notice blood in vomit, in a bowel movement, or in urine.   You have an oral temperature above 102 F (38.9 C), not controlled by medicine.  You may have another PE. A blood clot in the lungs is a medical emergency. Call your local emergency services (911 in [place] to get to the nearest hospital or clinic. Do not drive yourself. MAKE SURE YOU:   Understand these instructions.   Will watch your condition.   Will get help right away if you are not doing well or get worse.  Document Released: 06/21/2000 Document Revised: 06/13/2011 Document Reviewed: 12/26/2008 [facility] Patient Information 2012 [place], [place].

## 2011-11-29 NOTE — Assessment & Plan Note (Signed)
Recommended follow up with GYN and is having some vaginal bleeding but about 3 pads per day. Advised her that if the bleeding increases or is having lots of clots to present to an urgent care center or ER.

## 2011-11-29 NOTE — Progress Notes (Signed)
Subjective:     Patient ID: Megan Chandler, female   DOB: 02/19/1976, 36 y.o.   MRN: [id number]  HPI The patient is a 36 year old female who is new to our clinic at today's visit. She was recently hospitalized at [facility] and taken care of by the [facility] doctors and cardiology doctors. 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 They did do MRI studies which did show that the pulmonary embolism was not inside of the heart. She does have a history of excessive bleeding with her periods. This is apparently a family history per her mother who accompanies her in the room. She was an active smoker prior to this PE and she also did use NuvaRing which has hormones. She will need to be on Coumadin for 6 months at that time a discussion needs to take place whether it would be more effective to keep her on Coumadin or to take her off and see if she has repeat VTE event. She has had surgery in the past on her back and does have chronic back pain issues. Normally she is not on any pain medication for this. She does use Xanax for anxiety. She was given Percocet on discharge for her pain related to her PE. She did take a pain pill prior to coming to this visit and is slightly sleepy during our conversation. It would appear that at baseline her judgment is not on par with a general adult's. Her mother will be moving her back to [place] to stay for 3-4 months to manage and take care of her medication needs. Her mother does have a doctor in [place] who will be willing to see the patient. I have advised them to give [place] the details of this doctor and we can give them information regarding the hospital stay as well as fax information to the office. She did quit smoking in the hospital and has not smoked since. I did strongly reinforce the need to never smoke again. She is currently taking all of her medications as prescribed. She is not having any acute or active symptoms at today's visit. No shortness of  breath, occasional chest pains from the blood clot. No nausea, vomiting, diarrhea. She does not admit to any bleeding or coughing up any blood. I did spend an extensive amount of time counseling her and her mother. This time was greater than 35 minutes.  Review of Systems  Constitutional: Negative.   HENT: Negative.   Eyes: Negative.   Respiratory: Positive for chest tightness. Negative for cough, shortness of breath and wheezing.   Cardiovascular: Positive for chest pain. Negative for palpitations and leg swelling.  Gastrointestinal: Negative.   Genitourinary: Positive for vaginal bleeding.       Patient does have history of excessive menstrual bleeding and is currently bleeding. She states that the removal of the NuvaRing did cause some bleeding. She denies more than 3 pads per day.  Musculoskeletal: Positive for back pain.       Back pain chronic, unchanged.  Skin: Negative.   Neurological: Negative.   Psychiatric/Behavioral: Negative.     Vitals: A pressure: 135/85 Pulse: 106 Temperature: 1F Oxygen saturation room air: 100% Weight: 140 pounds    Objective:   Physical Exam  Constitutional: She is oriented to person, place, and time. She appears well-developed and well-nourished.  HENT:  Head: Normocephalic and atraumatic.  Eyes: EOM are normal. Pupils are equal, round, and reactive to light.  Neck: Normal range of motion. Neck  supple.  Cardiovascular: Normal rate and regular rhythm.   Pulmonary/Chest: Effort normal and breath sounds normal.  Abdominal: Soft. Bowel sounds are normal.  Musculoskeletal:       Some stiffness and tenderness in the back.  Neurological: She is oriented to person, place, and time.       Sleepy   Skin: Skin is warm and dry.  Psychiatric: She has a normal mood and affect. Her behavior is normal.       Some slowing of thought process. Likely somewhat secondary to pain medication but also likely some mental slowing overall.          Assessment/Plan:   1. Please see problem oriented charting.  2. Disposition-the patient be seen back when she returns from [place]. I did give her a refill on medications for one month. I did advise her strongly that she needs to be seen by doctor in [place] one week from today's date. Did change her Coumadin dosing from 6 mg daily to 5 mg daily as her INR was slightly elevated at 3.4 at today's visit. Did advise her that she needs to stop smoking and stop day of smoking. She has quit for one week at today's visit. Did prescribe Symbicort to take better control of her breathing problems right now she does have a pulmonary embolism which is currently resolving and will likely need increased breathing medications at this time. She also has albuterol inhaler which I have refilled at today's visit. Did advise her that the timing of her Provera and her warfarin were to be taken at the same time every day and on a very regular basis. Did order CBC at today's visit which did come back with a hemoglobin that was 8.4 which is stable.

## 2011-11-29 NOTE — Assessment & Plan Note (Signed)
Advised that smoking cessation should help her breathing, did advise use of symbicort while PE is resolving as she may have more breathing problems along with albuterol when needed for acute SOB.

## 2011-11-29 NOTE — Assessment & Plan Note (Signed)
Pt did quit in the hospital and I strongly reinforced that she needed to quit forever. She seemed to be in agreement.

## 2011-11-29 NOTE — Assessment & Plan Note (Addendum)
INR was 3.4 at today's visit and did switch her coumadin to 5 mg daily from 6 mg daily. Did confiscate her old pill bottle so that she would not be able to mistake them. Did state that she needs to have her level checked in 1 week at any doctor's office, ER, urgent care since she will be in [place] at that time. Hospital records from this hospitalization were printed and given to her mother so they can be given to her new doctor in [place]. She is still having occasional pains in her chest, no swelling in her legs, stopped smoking and is now on a progesterone only birth control mechanism provera. No problems breathing.

## 2011-11-29 NOTE — Assessment & Plan Note (Signed)
In hospital related to menometrorrhagia. Still having some bleeding and did have follow up with gyn that she was supposed to schedule regarding management of that problem long term. Did check CBC and Hg is stable at 8.4. Did advise to go see gyn and to take care of this problem more permanently.

## 2011-12-20 ENCOUNTER — Other Ambulatory Visit: Payer: Self-pay | Admitting: Internal Medicine

## 2011-12-20 NOTE — Telephone Encounter (Signed)
Patient called for refill on pain medication. Patient is allergic to Vicodin. I informed the patient that I am not able to call in any pain medication. Patient is reluctant to take Acetaminophen since she things to interfere with Coumadin and her blood clot furthermore she stayed that Acetaminophen has aspirin in it. I informed her that Acetaminophen has only one ingredient in she could take 650 mg every 6 hours if needed. Patient was not very happy about it and just hung up.

## 2011-12-31 ENCOUNTER — Telehealth: Payer: Self-pay | Admitting: *Deleted

## 2011-12-31 NOTE — Telephone Encounter (Signed)
Pt needs appt next week for Coumadin check with Dr Alexandria Lodge - appt made 01/06/12 2:30PM. [facility] will inform pt - still out of town. Stanton Kidney Mildred Bollard RN 12/30/21 10:25AM

## 2011-12-31 NOTE — Telephone Encounter (Signed)
Call from pt in [place]. States she is having "a lot of pain" and "shortness of breath" and the pharmacy in [facility] will not fill the Percocet rx written by Dr Dorise Hiss dated 5/23.  The pharmacy told her they need Dr Dorise Hiss or someone from her office to call them before they fill the rx. Pt is very anxious and sounds upset; repeating c/o pain in her chest. I told her if the pain is that severe; she needs to go the the ED. I called [facility] - [phone]- I was told pt had received Percocet #30 tabs from a Dr Cleda Daub on June 14. I called pt back and asked her about this and she said he is her mother's doctor and 1 percocet a day is not controlling her pain. And she has only  Has 1 Tramadol tab left. I talked to Dr. Dorise Hiss. She stated pt needs to be seen if she having so much pain; needs to go to the ED in [facility]. And denied any Percocet refill since She has not been seen since May. Pt was informed.  Pt stated she will not go to the ED in [facility] b/c of poor care she had received there before; stated she will wait until she comes Back to [place] where she will rec better care. Stated she will be back here Friday.  I told her if her pain/SOB worsen - go to the ED. Also when she returns to [facility], if her condition is not better, go to the ED; she agreed.

## 2012-01-06 ENCOUNTER — Ambulatory Visit (INDEPENDENT_AMBULATORY_CARE_PROVIDER_SITE_OTHER): Payer: Medicaid Other | Admitting: Pharmacist

## 2012-01-06 DIAGNOSIS — I4891 Unspecified atrial fibrillation: Secondary | ICD-10-CM

## 2012-01-06 DIAGNOSIS — Z954 Presence of other heart-valve replacement: Secondary | ICD-10-CM

## 2012-01-06 DIAGNOSIS — I749 Embolism and thrombosis of unspecified artery: Secondary | ICD-10-CM

## 2012-01-06 DIAGNOSIS — I359 Nonrheumatic aortic valve disorder, unspecified: Secondary | ICD-10-CM

## 2012-01-06 DIAGNOSIS — I824Y9 Acute embolism and thrombosis of unspecified deep veins of unspecified proximal lower extremity: Secondary | ICD-10-CM

## 2012-01-06 DIAGNOSIS — I059 Rheumatic mitral valve disease, unspecified: Secondary | ICD-10-CM

## 2012-01-06 DIAGNOSIS — I2699 Other pulmonary embolism without acute cor pulmonale: Secondary | ICD-10-CM

## 2012-01-06 DIAGNOSIS — D6859 Other primary thrombophilia: Secondary | ICD-10-CM

## 2012-01-06 DIAGNOSIS — Z7901 Long term (current) use of anticoagulants: Secondary | ICD-10-CM | POA: Insufficient documentation

## 2012-01-06 LAB — POCT INR: INR: 2.6

## 2012-01-06 NOTE — Patient Instructions (Signed)
Patient instructed to take medications as defined in the Anti-coagulation Track section of this encounter.  Patient instructed to take today's dose.  Patient verbalized understanding of these instructions.    

## 2012-01-06 NOTE — Progress Notes (Signed)
Anti-Coagulation Progress Note  Megan Chandler is a 36 y.o. female who is currently on an anti-coagulation regimen.    RECENT RESULTS: Recent results are below, the most recent result is correlated with a dose of 35 mg. per week: Lab Results  Component Value Date   INR 2.60 01/06/2012   INR 3.4 11/28/2011   INR 2.64* 11/26/2011    ANTI-COAG DOSE:   Latest dosing instructions   Total Sun Mon Tue Wed Thu Fri Sat   37.5 5 mg 7.5 mg 5 mg 5 mg 5 mg 5 mg 5 mg    (5 mg1) (5 mg1.5) (5 mg1) (5 mg1) (5 mg1) (5 mg1) (5 mg1)         ANTICOAG SUMMARY: Anticoagulation Episode Summary              Current INR goal 2.0-3.0 Next INR check 01/13/2012   INR from last check 2.60 (01/06/2012)     Weekly max dose (mg)  Target end date    Indications Encounter for long-term (current) use of anticoagulants, Pulmonary embolism with infarction   INR check location [facility]    Send INR reminders to    Comments Orthopedic (back) surgery Jan 2013. States in May 2013 developed PE and "blood clot on my heart". Recently hospitalized in [place] and discharged from [place] last week.             ANTICOAG TODAY: Anticoagulation Summary as of 01/06/2012              INR goal 2.0-3.0     Selected INR 2.60 (01/06/2012) Next INR check 01/13/2012   Weekly max dose (mg)  Target end date    Indications Encounter for long-term (current) use of anticoagulants, Pulmonary embolism with infarction    Anticoagulation Episode Summary              INR check location [facility] lab    Send INR reminders to    Comments Orthopedic (back) surgery Jan 2013. States in May 2013 developed PE and "blood clot on my heart". Recently hospitalized in [place] and discharged from [place] last week.             PATIENT INSTRUCTIONS: Patient Instructions  Patient instructed to take medications as defined in the Anti-coagulation Track section of this encounter.  Patient instructed to take  today's dose.  Patient verbalized understanding of these instructions.       FOLLOW-UP Return in 7 days (on 01/13/2012) for Follow up INR at 2PM.  Hulen Luster, III Pharm.D., CACP

## 2012-01-13 ENCOUNTER — Ambulatory Visit: Payer: Medicaid Other

## 2012-01-13 ENCOUNTER — Other Ambulatory Visit: Payer: Self-pay | Admitting: *Deleted

## 2012-01-13 ENCOUNTER — Ambulatory Visit (INDEPENDENT_AMBULATORY_CARE_PROVIDER_SITE_OTHER): Payer: Medicaid Other | Admitting: Pharmacist

## 2012-01-13 DIAGNOSIS — I824Y9 Acute embolism and thrombosis of unspecified deep veins of unspecified proximal lower extremity: Secondary | ICD-10-CM

## 2012-01-13 DIAGNOSIS — Z7901 Long term (current) use of anticoagulants: Secondary | ICD-10-CM

## 2012-01-13 DIAGNOSIS — I749 Embolism and thrombosis of unspecified artery: Secondary | ICD-10-CM

## 2012-01-13 DIAGNOSIS — I2699 Other pulmonary embolism without acute cor pulmonale: Secondary | ICD-10-CM

## 2012-01-13 DIAGNOSIS — I4891 Unspecified atrial fibrillation: Secondary | ICD-10-CM

## 2012-01-13 DIAGNOSIS — Z954 Presence of other heart-valve replacement: Secondary | ICD-10-CM

## 2012-01-13 DIAGNOSIS — D6859 Other primary thrombophilia: Secondary | ICD-10-CM

## 2012-01-13 DIAGNOSIS — I359 Nonrheumatic aortic valve disorder, unspecified: Secondary | ICD-10-CM

## 2012-01-13 DIAGNOSIS — I059 Rheumatic mitral valve disease, unspecified: Secondary | ICD-10-CM

## 2012-01-13 LAB — POCT INR: INR: 2

## 2012-01-13 MED ORDER — WARFARIN SODIUM 5 MG PO TABS: ORAL_TABLET | ORAL | Status: DC

## 2012-01-13 NOTE — Telephone Encounter (Signed)
Please see previous phone note. This patient was seen once with the intention of moving to [place] briefly with her mother. She was to be reseen in clinic once she wanted to re-establish care. She has called multiple times requesting pain medicine etc from [place] and told to go to urgent care there or ED. She has seen a doctor up there as they are doing her coumadin dosing. We will not be providing any refills for anything until she is seen back. Plus this patient is not on alprazolam and will not be rxed that without being seen! Please call back and advise to go to doctor in [place] or if back in [place] to be seen in clinic please.  Thanks,  Dr. Dorise Hiss

## 2012-01-13 NOTE — Progress Notes (Signed)
Anti-Coagulation Progress Note  Megan Chandler is a 36 y.o. female who is currently on an anti-coagulation regimen.    RECENT RESULTS: Recent results are below, the most recent result is correlated with a dose of 37.5 mg. per week: Lab Results  Component Value Date   INR 2.00 01/13/2012   INR 2.60 01/06/2012   INR 3.4 11/28/2011    ANTI-COAG DOSE:   Latest dosing instructions   Total Sun Mon Tue Wed Thu Fri Sat   45 5 mg 7.5 mg 7.5 mg 7.5 mg 5 mg 7.5 mg 5 mg    (5 mg1) (5 mg1.5) (5 mg1.5) (5 mg1.5) (5 mg1) (5 mg1.5) (5 mg1)         ANTICOAG SUMMARY: Anticoagulation Episode Summary              Current INR goal 2.0-3.0 Next INR check 01/27/2012   INR from last check 2.00 (01/13/2012)     Weekly max dose (mg)  Target end date    Indications Encounter for long-term (current) use of anticoagulants, Pulmonary embolism with infarction   INR check location [facility] lab    Send INR reminders to    Comments Orthopedic (back) surgery Jan 2013. States in May 2013 developed PE and "blood clot on my heart". Recently hospitalized in [place] and discharged from [place] last week.             ANTICOAG TODAY: Anticoagulation Summary as of 01/13/2012              INR goal 2.0-3.0     Selected INR 2.00 (01/13/2012) Next INR check 01/27/2012   Weekly max dose (mg)  Target end date    Indications Encounter for long-term (current) use of anticoagulants, Pulmonary embolism with infarction    Anticoagulation Episode Summary              INR check location [facility] lab    Send INR reminders to    Comments Orthopedic (back) surgery Jan 2013. States in May 2013 developed PE and "blood clot on my heart". Recently hospitalized in [place] and discharged from [place] last week.             PATIENT INSTRUCTIONS: Patient Instructions  Patient instructed to take medications as defined in the Anti-coagulation Track section of this encounter.  Patient instructed to  take today's dose.  Patient verbalized understanding of these instructions.        FOLLOW-UP Return in 2 weeks (on 01/27/2012) for Follow up INR at 1445h.  Hulen Luster, III Pharm.D., CACP

## 2012-01-13 NOTE — Patient Instructions (Signed)
Patient instructed to take medications as defined in the Anti-coagulation Track section of this encounter.  Patient instructed to take today's dose.  Patient verbalized understanding of these instructions.    

## 2012-01-13 NOTE — Telephone Encounter (Signed)
Also requesting refill on Alprazolam 0.5mg  and Ferrous Sulfate 325mg .  thanks

## 2012-01-14 NOTE — Telephone Encounter (Signed)
Pt has an appt w/Dr Clyde Lundborg 7/11 - pt was called and message left to keep appt for any refills. Also has an appt w/Dr Dorise Hiss in August.

## 2012-01-16 ENCOUNTER — Encounter: Payer: Self-pay | Admitting: Internal Medicine

## 2012-01-16 ENCOUNTER — Ambulatory Visit (INDEPENDENT_AMBULATORY_CARE_PROVIDER_SITE_OTHER): Payer: Medicaid Other | Admitting: Internal Medicine

## 2012-01-16 ENCOUNTER — Ambulatory Visit (HOSPITAL_COMMUNITY)
Admission: RE | Admit: 2012-01-16 | Discharge: 2012-01-16 | Disposition: A | Discharge status: Home / Self Care | Payer: Medicaid Other | Source: Ambulatory Visit | Attending: Internal Medicine | Admitting: Internal Medicine

## 2012-01-16 VITALS — BP 129/72 | HR 86 | Temp 97.7°F | Ht 66.0 in | Wt 150.1 lb

## 2012-01-16 DIAGNOSIS — R079 Chest pain, unspecified: Secondary | ICD-10-CM | POA: Insufficient documentation

## 2012-01-16 DIAGNOSIS — N921 Excessive and frequent menstruation with irregular cycle: Secondary | ICD-10-CM

## 2012-01-16 DIAGNOSIS — R091 Pleurisy: Secondary | ICD-10-CM

## 2012-01-16 DIAGNOSIS — F419 Anxiety disorder, unspecified: Secondary | ICD-10-CM

## 2012-01-16 DIAGNOSIS — J90 Pleural effusion, not elsewhere classified: Secondary | ICD-10-CM | POA: Insufficient documentation

## 2012-01-16 DIAGNOSIS — N920 Excessive and frequent menstruation with regular cycle: Secondary | ICD-10-CM

## 2012-01-16 DIAGNOSIS — K219 Gastro-esophageal reflux disease without esophagitis: Secondary | ICD-10-CM

## 2012-01-16 DIAGNOSIS — R0602 Shortness of breath: Secondary | ICD-10-CM

## 2012-01-16 DIAGNOSIS — D649 Anemia, unspecified: Secondary | ICD-10-CM

## 2012-01-16 DIAGNOSIS — I2699 Other pulmonary embolism without acute cor pulmonale: Secondary | ICD-10-CM

## 2012-01-16 LAB — CBC
Hemoglobin: 13 g/dL (ref 12.0–15.0)
MCH: 26.5 pg (ref 26.0–34.0)
MCV: 84.7 fL (ref 78.0–100.0)
RBC: 4.9 MIL/uL (ref 3.87–5.11)
WBC: 9.7 10*3/uL (ref 4.0–10.5)

## 2012-01-16 MED ORDER — IBUPROFEN 200 MG PO TABS: 400.0000 mg | ORAL_TABLET | Freq: Three times a day (TID) | ORAL | Status: DC | PRN

## 2012-01-16 MED ORDER — FERROUS SULFATE 325 (65 FE) MG PO TABS: 325.0000 mg | ORAL_TABLET | Freq: Every day | ORAL | Status: DC

## 2012-01-16 MED ORDER — WARFARIN SODIUM 5 MG PO TABS: ORAL_TABLET | ORAL | Status: DC

## 2012-01-16 MED ORDER — ALPRAZOLAM 0.5 MG PO TABS: 0.5000 mg | ORAL_TABLET | Freq: Every evening | ORAL | Status: DC | PRN

## 2012-01-16 MED ORDER — BUDESONIDE-FORMOTEROL FUMARATE 80-4.5 MCG/ACT IN AERO: 2.0000 | INHALATION_SPRAY | Freq: Two times a day (BID) | RESPIRATORY_TRACT | Status: DC

## 2012-01-16 MED ORDER — FAMOTIDINE 20 MG PO TABS: 20.0000 mg | ORAL_TABLET | Freq: Two times a day (BID) | ORAL | Status: DC

## 2012-01-16 NOTE — Progress Notes (Signed)
Patient ID: Megan Chandler, female   DOB: Oct 12, 1975, 36 y.o.   MRN: [id number]  Subjective:   Patient ID: Megan Chandler female   DOB: [birth year] 36 y.o.   MRN: [id number]  HPI: [name] is a 36 y.o. with a past medical history of pulmonary embolism, who presents for chest pain.  Patient was hospitalized from  5/15 to 5/23 in [facility] due to pulmonary embolism. She was discharged from the hospital on Coumadin. Patient is currently taking Coumadin regularly. Her last INR was 2.0 at 7/8, when her Coumadin dose was increased. She was asked to come back to check her INR in one week.  The patient also has menometrorrhagia which is likely secondary to the use of NuvaRing. She was hospitalized in [facility] at 5/27 to 12/14/11 due to anemia. She was transfused with 2 units of blood. Currently patient is followed up with the Dr. Dellis Chandler. She is currently taking nasal spray of Nafrarelin and Megestrol pills. She has an appointment with Dr. Dellis Chandler in the beginning of August.  At June 16, patient was hospitalized again in [facility] in [place] due to severe chest pain. Patient was diagnosed with pleurisy. She was discharged on ibuprofen and tramadol. Because she was told previously that ibuprofen may increase bleeding tendency when combined with Coumadin. She didn't use ibuprofen at all. The tramadol does not help her chest pain significant. Currently patient is still having severe chest pain, her chest is located at left chest. It is a 10 out of 10 in severity, sharp and nonradiating. It is aggravated by deep breath. It is associated with mild shortness of breath. Patient doesn't have cough, fever or chills. She does not have pain over calf areas.    Past Medical History  Diagnosis Date  . Anemia   . Urinary tract infection   . Chronic low back pain     buldging disc  . Endometriosis   . Pneumonia     as a child  . Bronchitis   . Headache    often but no migraines  . Arthritis   . History of bladder infections   . Hypertension     but not on medication  . Bartholin's cyst     hx of  . Asthma    Current Outpatient Prescriptions  Medication Sig Dispense Refill  . albuterol (PROVENTIL HFA;VENTOLIN HFA) 108 (90 BASE) MCG/ACT inhaler Inhale 2 puffs into the lungs every 4 (four) hours as needed for wheezing or shortness of breath.  1 Inhaler  0  . budesonide-formoterol (SYMBICORT) 80-4.5 MCG/ACT inhaler Inhale 2 puffs into the lungs 2 (two) times daily.  1 Inhaler  0  . famotidine (PEPCID) 20 MG tablet Take 1 tablet (20 mg total) by mouth 2 (two) times daily.  60 tablet  0  . warfarin (COUMADIN) 5 MG tablet Take as directed by anticoagulation clinic provider. Dispense 50 tablets = 1 month supply.  50 tablet  2   Family History  Problem Relation Age of Onset  . Hypertension Mother   . Cancer Maternal Aunt   . Cancer Maternal Uncle   . Diabetes Maternal Grandmother   . Anesthesia problems Neg Hx   . Hypotension Neg Hx   . Malignant hyperthermia Neg Hx   . Pseudochol deficiency Neg Hx    History   Social History  . Marital Status: Single    Spouse Name: N/A    Number of Children: N/A  . Years  of Education: N/A   Social History Main Topics  . Smoking status: Former Smoker    Types: Cigars  . Smokeless tobacco: Never Used  . Alcohol Use: No  . Drug Use: No  . Sexually Active: Not Currently    Birth Control/ Protection: Inserts     Nuvaring   Other Topics Concern  . None   Social History Narrative  . None   Review of Systems:  General: no fevers, chills, no changes in body weight, no changes in appetite Skin: no rash HEENT: no blurry vision, hearing changes or sore throat Pulm: no coughing, wheezing. Has mild SOB. CV: has chest pain and shortness of breath. Bo palpitation. Abd: no nausea/vomiting, abdominal pain, diarrhea/constipation GU: no dysuria, hematuria, polyuria Ext: no arthralgias,  myalgias Neuro: no weakness, numbness, or tingling   Objective:  Physical Exam: Filed Vitals:   01/16/12 1518  BP: 129/72  Pulse: 86  Temp: 97.7 F (36.5 C)  TempSrc: Oral  Height: 5\' 6"  (1.676 m)  Weight: 150 lb 1.6 oz (68.085 kg)  SpO2: 100%   General: resting in bed, not in acute distress HEENT: PERRL, EOMI, no scleral icterus Cardiac: S1/S2, RRR, No murmurs, gallops or rubs Pulm: Good air movement bilaterally, Clear to auscultation bilaterally, No rales, wheezing, rhonchi or rubs. Patient can not take deep breath due to chest pain. Abd: Soft,  nondistended, nontender, no rebound pain, no organomegaly, BS present Ext: No rashes or edema, 2+DP/PT pulse bilaterally. There is no tenderness, redness or swelling over the calf areas. Musculoskeletal: No joint deformities, erythema, or stiffness, ROM full and no nontender Skin: no rashes. No skin bruise. Neuro: alert and oriented X3, cranial nerves II-XII grossly intact, muscle strength 5/5 in all extremeties,  sensation to light touch intact.  Psych.: patient is not psychotic, no suicidal or hemocidal ideation.   Assessment & Plan:

## 2012-01-16 NOTE — Assessment & Plan Note (Signed)
Patient was recently diagnosed as pleurisy  in [facility] [place] at 12/22/11. She supposed to take ibuprofen for her pleurisy, but she did not take ibuprofen due to the concern of increased bleeding tendency. Her chest pain is most consistent with the pleurisy. It is unlike likely due to the acute coronary artery syndrome, given patient doesn't have significant risk factors for ACS and her chest pain is pleuritic in nature. We think the best treatment would be nonsteroidal anti-inflammatory drug. We'll treat the patient with ibuprofen. We'll check chest x-ray today.

## 2012-01-16 NOTE — Assessment & Plan Note (Signed)
Patient is on Coumadin. Her INR has been therapeutic since Nov 25, 2011. Her current chest pain is unlikely due to new pulmonary embolism. Patient doesn't have severe shortness of breath, tachycardia or oxygen desaturation (oxygen saturation is 100% at room air today). We'll continue Coumadin and manage the INR.

## 2012-01-16 NOTE — Assessment & Plan Note (Signed)
Currently patient is followed up with  Dr. Dellis Filbert. She is currently taking nasal spray of Nafrarelin and Megestrol pills. She has an appointment with Dr. Dellis Filbert in the beginning of August. She was hospitalized in [facility] at 5/27 to 12/14/11 due to anemia. She was transfused with 2 units of blood. It is not clear what is her current hemoglobin level. Will check CBC.

## 2012-01-16 NOTE — Patient Instructions (Signed)
1. Please start taking Ibuprofen from now.  2. Please take all medications as prescribed.  3. If you have worsening of your symptoms or new symptoms arise, please call the clinic ([phone]), or go to the ER immediately if symptoms are severe.   Pleurisy Pleurisy is an inflammation and swelling of the lining of the lungs. It usually is the result of an underlying infection or other disease. Because of this inflammation, it hurts to breathe. It is aggravated by coughing or deep breathing. The primary goal in treating pleurisy is to diagnose and treat the condition that caused it.  HOME CARE INSTRUCTIONS   Only take over-the-counter or prescription medicines for pain, discomfort, or fever as directed by your caregiver.   If medications which kill germs (antibiotics) were prescribed, take the entire course. Even if you are feeling better, you need to take them.   Use a cool mist vaporizer to help loosen secretions. This is so the secretions can be coughed up more easily.  SEEK MEDICAL CARE IF:   Your pain is not controlled with medication or is increasing.   You have an increase inpus like (purulent) secretions brought up with coughing.  SEEK IMMEDIATE MEDICAL CARE IF:   You have blue or dark lips, fingernails, or toenails.   You begin coughing up blood.   You have increased difficulty breathing.   You have continuing pain unrelieved by medicine or lasting more than 1 week.   You have pain that radiates into your neck, arms, or jaw.   You develop increased shortness of breath or wheezing.   You develop a fever, rash, vomiting, fainting, or other serious complaints.  Document Released: 06/24/2005 Document Revised: 06/13/2011 Document Reviewed: 01/23/2007 [employer] Patient Information 2012 [place], [place].

## 2012-01-27 ENCOUNTER — Telehealth: Payer: Self-pay | Admitting: *Deleted

## 2012-01-27 ENCOUNTER — Ambulatory Visit (INDEPENDENT_AMBULATORY_CARE_PROVIDER_SITE_OTHER): Payer: Medicaid Other | Admitting: Pharmacist

## 2012-01-27 DIAGNOSIS — I824Y9 Acute embolism and thrombosis of unspecified deep veins of unspecified proximal lower extremity: Secondary | ICD-10-CM

## 2012-01-27 DIAGNOSIS — I749 Embolism and thrombosis of unspecified artery: Secondary | ICD-10-CM

## 2012-01-27 DIAGNOSIS — I059 Rheumatic mitral valve disease, unspecified: Secondary | ICD-10-CM

## 2012-01-27 DIAGNOSIS — D6859 Other primary thrombophilia: Secondary | ICD-10-CM

## 2012-01-27 DIAGNOSIS — Z954 Presence of other heart-valve replacement: Secondary | ICD-10-CM

## 2012-01-27 DIAGNOSIS — I359 Nonrheumatic aortic valve disorder, unspecified: Secondary | ICD-10-CM

## 2012-01-27 DIAGNOSIS — I2699 Other pulmonary embolism without acute cor pulmonale: Secondary | ICD-10-CM

## 2012-01-27 DIAGNOSIS — Z7901 Long term (current) use of anticoagulants: Secondary | ICD-10-CM

## 2012-01-27 DIAGNOSIS — I4891 Unspecified atrial fibrillation: Secondary | ICD-10-CM

## 2012-01-27 LAB — POCT INR: INR: 3

## 2012-01-27 NOTE — Telephone Encounter (Signed)
Pt was here to see Dr Alexandria Lodge for Coumadin - GYN doctor suggest surgery. Has questions about Coumadin. FU appt with GYN doctor sch. Dr Shirlee Latch 01/28/12 3:15PM. Stanton Kidney Reyonna Haack RN 01/27/12 2:45PM

## 2012-01-27 NOTE — Patient Instructions (Signed)
Patient instructed to take medications as defined in the Anti-coagulation Track section of this encounter.  Patient instructed to take today's dose.  Patient verbalized understanding of these instructions.    

## 2012-01-27 NOTE — Progress Notes (Signed)
Anti-Coagulation Progress Note  Megan Chandler is a 36 y.o. female who is currently on an anti-coagulation regimen.    RECENT RESULTS: Recent results are below, the most recent result is correlated with a dose of  45mg . per week: Lab Results  Component Value Date   INR 3.0 01/27/2012   INR 2.00 01/13/2012   INR 2.60 01/06/2012    ANTI-COAG DOSE:   Latest dosing instructions   Total Sun Mon Tue Wed Thu Fri Sat   42.5 5 mg 7.5 mg 5 mg 7.5 mg 5 mg 7.5 mg 5 mg    (5 mg1) (5 mg1.5) (5 mg1) (5 mg1.5) (5 mg1) (5 mg1.5) (5 mg1)         ANTICOAG SUMMARY: Anticoagulation Episode Summary              Current INR goal 2.0-3.0 Next INR check 02/10/2012   INR from last check 3.0 (01/27/2012)     Weekly max dose (mg)  Target end date    Indications Encounter for long-term (current) use of anticoagulants, Pulmonary embolism with infarction   INR check location [facility] Preferred lab    Send INR reminders to    Comments Orthopedic (back) surgery Jan 2013. States in May 2013 developed PE and "blood clot on my heart". Recently hospitalized in [place] and discharged from [place] last week.             ANTICOAG TODAY: Anticoagulation Summary as of 01/27/2012              INR goal 2.0-3.0     Selected INR 3.0 (01/27/2012) Next INR check 02/10/2012   Weekly max dose (mg)  Target end date    Indications Encounter for long-term (current) use of anticoagulants, Pulmonary embolism with infarction    Anticoagulation Episode Summary              INR check location [facility] Preferred lab    Send INR reminders to    Comments Orthopedic (back) surgery Jan 2013. States in May 2013 developed PE and "blood clot on my heart". Recently hospitalized in [place] and discharged from [place] last week.             PATIENT INSTRUCTIONS: Patient Instructions  Patient instructed to take medications as defined in the Anti-coagulation Track section of this encounter.  Patient instructed to take  today's dose.  Patient verbalized understanding of these instructions.        FOLLOW-UP Return in 2 weeks (on 02/10/2012) for Follow up INR at 2:00PM.  Hulen Luster, III Pharm.D., CACP

## 2012-01-28 ENCOUNTER — Encounter (HOSPITAL_COMMUNITY): Payer: Self-pay | Admitting: *Deleted

## 2012-01-28 ENCOUNTER — Emergency Department (HOSPITAL_COMMUNITY): Payer: Medicaid Other

## 2012-01-28 ENCOUNTER — Emergency Department (HOSPITAL_COMMUNITY)
Admission: EM | Admit: 2012-01-28 | Discharge: 2012-01-28 | Disposition: A | Discharge status: Home / Self Care | Payer: Medicaid Other | Attending: Emergency Medicine | Admitting: Emergency Medicine

## 2012-01-28 ENCOUNTER — Encounter: Payer: Self-pay | Admitting: Internal Medicine

## 2012-01-28 ENCOUNTER — Other Ambulatory Visit: Payer: Self-pay

## 2012-01-28 ENCOUNTER — Ambulatory Visit (INDEPENDENT_AMBULATORY_CARE_PROVIDER_SITE_OTHER): Payer: Medicaid Other | Admitting: Internal Medicine

## 2012-01-28 ENCOUNTER — Other Ambulatory Visit: Payer: Self-pay | Admitting: Internal Medicine

## 2012-01-28 VITALS — BP 117/78 | HR 82 | Temp 97.7°F | Resp 20 | Ht 65.0 in | Wt 157.7 lb

## 2012-01-28 DIAGNOSIS — N921 Excessive and frequent menstruation with irregular cycle: Secondary | ICD-10-CM

## 2012-01-28 DIAGNOSIS — N939 Abnormal uterine and vaginal bleeding, unspecified: Secondary | ICD-10-CM

## 2012-01-28 DIAGNOSIS — R0609 Other forms of dyspnea: Secondary | ICD-10-CM | POA: Insufficient documentation

## 2012-01-28 DIAGNOSIS — I2699 Other pulmonary embolism without acute cor pulmonale: Secondary | ICD-10-CM

## 2012-01-28 DIAGNOSIS — N920 Excessive and frequent menstruation with regular cycle: Secondary | ICD-10-CM

## 2012-01-28 DIAGNOSIS — Z79899 Other long term (current) drug therapy: Secondary | ICD-10-CM | POA: Insufficient documentation

## 2012-01-28 DIAGNOSIS — J45909 Unspecified asthma, uncomplicated: Secondary | ICD-10-CM

## 2012-01-28 DIAGNOSIS — R0781 Pleurodynia: Secondary | ICD-10-CM | POA: Insufficient documentation

## 2012-01-28 DIAGNOSIS — Z7901 Long term (current) use of anticoagulants: Secondary | ICD-10-CM | POA: Insufficient documentation

## 2012-01-28 DIAGNOSIS — I10 Essential (primary) hypertension: Secondary | ICD-10-CM

## 2012-01-28 DIAGNOSIS — R0989 Other specified symptoms and signs involving the circulatory and respiratory systems: Secondary | ICD-10-CM | POA: Insufficient documentation

## 2012-01-28 DIAGNOSIS — R071 Chest pain on breathing: Secondary | ICD-10-CM

## 2012-01-28 LAB — COMPREHENSIVE METABOLIC PANEL
AST: 17 U/L (ref 0–37)
Albumin: 4.2 g/dL (ref 3.5–5.2)
Alkaline Phosphatase: 67 U/L (ref 39–117)
Chloride: 104 mEq/L (ref 96–112)
Potassium: 4.4 mEq/L (ref 3.5–5.1)
Sodium: 138 mEq/L (ref 135–145)
Total Bilirubin: 0.2 mg/dL — ABNORMAL LOW (ref 0.3–1.2)
Total Protein: 7.3 g/dL (ref 6.0–8.3)

## 2012-01-28 LAB — CBC WITH DIFFERENTIAL/PLATELET
Basophils Absolute: 0 10*3/uL (ref 0.0–0.1)
Eosinophils Relative: 4 % (ref 0–5)
Eosinophils Relative: 3 % (ref 0–5)
Lymphocytes Relative: 31 % (ref 12–46)
Lymphocytes Relative: 33 % (ref 12–46)
Lymphs Abs: 4 10*3/uL (ref 0.7–4.0)
MCV: 86.2 fL (ref 78.0–100.0)
MCV: 85.1 fL (ref 78.0–100.0)
Monocytes Relative: 5 % (ref 3–12)
Neutro Abs: 7.7 10*3/uL (ref 1.7–7.7)
Neutrophils Relative %: 59 % (ref 43–77)
Platelets: 444 10*3/uL — ABNORMAL HIGH (ref 150–400)
Platelets: 448 10*3/uL — ABNORMAL HIGH (ref 150–400)
RBC: 4.36 MIL/uL (ref 3.87–5.11)
RBC: 4.42 MIL/uL (ref 3.87–5.11)
WBC: 12.9 10*3/uL — ABNORMAL HIGH (ref 4.0–10.5)
WBC: 14.7 10*3/uL — ABNORMAL HIGH (ref 4.0–10.5)

## 2012-01-28 LAB — PROTIME-INR
INR: 2.35 — ABNORMAL HIGH (ref 0.00–1.49)
Prothrombin Time: 26.1 seconds — ABNORMAL HIGH (ref 11.6–15.2)

## 2012-01-28 MED ORDER — ALBUTEROL SULFATE HFA 108 (90 BASE) MCG/ACT IN AERS
2.0000 | INHALATION_SPRAY | RESPIRATORY_TRACT | Status: DC
  Administered 2012-01-28: 2 via RESPIRATORY_TRACT
  Filled 2012-01-28: qty 6.7

## 2012-01-28 MED ORDER — LORAZEPAM 2 MG/ML IJ SOLN
1.0000 mg | Freq: Once | INTRAMUSCULAR | Status: AC
  Administered 2012-01-28: 20:00:00 via INTRAVENOUS

## 2012-01-28 MED ORDER — IPRATROPIUM BROMIDE 0.02 % IN SOLN
RESPIRATORY_TRACT | Status: AC
  Administered 2012-01-28: 20:00:00
  Filled 2012-01-28: qty 2.5

## 2012-01-28 MED ORDER — METHYLPREDNISOLONE SODIUM SUCC 125 MG IJ SOLR: 125.0000 mg | Freq: Once | INTRAMUSCULAR | Status: DC

## 2012-01-28 MED ORDER — LORAZEPAM 2 MG/ML IJ SOLN
INTRAMUSCULAR | Status: AC
  Filled 2012-01-28: qty 1

## 2012-01-28 MED ORDER — ALBUTEROL SULFATE HFA 108 (90 BASE) MCG/ACT IN AERS: 2.0000 | INHALATION_SPRAY | RESPIRATORY_TRACT | Status: DC | PRN

## 2012-01-28 MED ORDER — IOHEXOL 350 MG/ML SOLN
100.0000 mL | Freq: Once | INTRAVENOUS | Status: AC | PRN
  Administered 2012-01-28: 100 mL via INTRAVENOUS

## 2012-01-28 MED ORDER — ALBUTEROL SULFATE (5 MG/ML) 0.5% IN NEBU
INHALATION_SOLUTION | RESPIRATORY_TRACT | Status: AC
  Administered 2012-01-28: 20:00:00
  Filled 2012-01-28: qty 2

## 2012-01-28 MED ORDER — LORAZEPAM 2 MG/ML IJ SOLN: 1.0000 mg | Freq: Once | INTRAMUSCULAR | Status: DC

## 2012-01-28 MED ORDER — SODIUM CHLORIDE 0.9 % IV SOLN
Freq: Once | INTRAVENOUS | Status: AC
  Administered 2012-01-28: 20:00:00 via INTRAVENOUS

## 2012-01-28 MED ORDER — ACETAMINOPHEN 325 MG PO TABS
650.0000 mg | ORAL_TABLET | Freq: Once | ORAL | Status: AC
  Administered 2012-01-28: 650 mg via ORAL
  Filled 2012-01-28: qty 2

## 2012-01-28 MED ORDER — LORAZEPAM 2 MG/ML IJ SOLN
1.0000 mg | Freq: Once | INTRAMUSCULAR | Status: AC
  Administered 2012-01-28: 1 mg via INTRAVENOUS
  Filled 2012-01-28: qty 1

## 2012-01-28 MED ORDER — METHYLPREDNISOLONE SODIUM SUCC 125 MG IJ SOLR
INTRAMUSCULAR | Status: AC
  Administered 2012-01-28: 20:00:00
  Filled 2012-01-28: qty 2

## 2012-01-28 MED ORDER — PREDNISONE 10 MG PO TABS: 20.0000 mg | ORAL_TABLET | Freq: Every day | ORAL | Status: DC

## 2012-01-28 NOTE — Addendum Note (Signed)
Addended by: Annett Gula on: 01/28/2012 06:31 PM   Modules accepted: Orders

## 2012-01-28 NOTE — Assessment & Plan Note (Addendum)
Assoc with intermittent dizziness, sob, anemia Pt is hemodynamically stable today BP 117/78 HR 82 Previous w/u showed Adenomyosis and neg endometrial bx Pt states she has endometriosis She was Rx Megace 80 mg bid and Synarel spray to help with bleeding by her OB/GYN She saw her OB/GYN last week about her constant vaginal bleeding who disc. Novasure (ablation) vs embolization  Plan -pt signed release of records to get records from [facility]. [place], will review and call to disc her tx rec. And if Coumadin should be stopped for procedures -will review procedural options  -ordered CBC (wnl 01/16/12), Ferritin -pt to continue Ferrosul 325 mg qd  -pt to f/u 1 week with Dr. Dorise Hiss (PCP), Dr. Shirlee Latch, Dr. Meredith Pel knows about this pt as well  -will review medical records and phone Dr. Aline Brochure to disc best and coordinate tx options  -can we hold on doing the procedure for vaginal bleeding until at least 3 mos after anticoagulation tx initiated (11/2011)  -will we need to bridge the pt with heparin (pt does not like sq injections)  -will we need to hold the Coumadin if the procedure is done and what is the best way to proceed? -f/u 1 week

## 2012-01-28 NOTE — Patient Instructions (Signed)
If you are experiencing worsening sob, chest pain, dizziness or lightheadedness come to the ED for immediate evaluation before 1 week

## 2012-01-28 NOTE — Assessment & Plan Note (Signed)
See plan PE

## 2012-01-28 NOTE — Assessment & Plan Note (Addendum)
-  etiology could be related to previous risk factors immobility at back surgery 07/2011 and Nuva Ring (pt removed Nuva ring 11/2011) -pt still having pleuritic cp and does not want to take Advil d/t increased r/o bleeding  01/16/12 CXR showed improved vent. And resolved R pleural effusion, small left residual effusion -Advised pt she can cont. Taking Hydrocodone 5-325 mg 1-2 tablets q4-6 hours for pain (another clinic Rx narcotic)  Plan -Duration of coumadin tx will need to be determined (initiated 11/2011) at some pt -pt has f/u appt with Dr. Alexandria Lodge and Dr. Dorise Hiss 02/10/12

## 2012-01-28 NOTE — ED Provider Notes (Addendum)
History     CSN: [id number]  Arrival date & time 01/28/12  1956   First MD Initiated Contact with Patient 01/28/12 1959      No chief complaint on file.   (Consider location/radiation/quality/duration/timing/severity/associated sxs/prior treatment) The history is provided by the patient.   Patient here with shortness of breath that happened while patient was walking in the woods states that she tripped and fell down and then became more dyspneic. Recently diagnosed with pulmonary embolism and just discharged from hospital today. Denies any pleuritic chest pain. A recent fevers. EMS was called patient given albuterol with Atrovent & Medrol. Breathing has improved and she is unable to move air Past Medical History  Diagnosis Date  . Anemia   . Urinary tract infection   . Chronic low back pain     buldging disc  . Endometriosis   . Pneumonia     as a child  . Bronchitis   . Headache     often but no migraines  . Arthritis   . History of bladder infections   . Hypertension     but not on medication  . Bartholin's cyst     hx of  . Asthma   . Pulmonary embolism     11/2011  . Menometrorrhagia   . Adenomyosis     Past Surgical History  Procedure Date  . Dilation and curettage of uterus   . Cholecystectomy, laparoscopic 1998  . Umbilical hernia repair     as a child  . Cesarean section 1996  . Laparoscopy at age 22  . Cervical spine surgery 2009  . Back surgery     07/2011    Family History  Problem Relation Age of Onset  . Hypertension Mother   . Cancer Maternal Aunt   . Cancer Maternal Uncle   . Diabetes Maternal Grandmother   . Anesthesia problems Neg Hx   . Hypotension Neg Hx   . Malignant hyperthermia Neg Hx   . Pseudochol deficiency Neg Hx   . Stroke Other     aunt  . Sickle cell anemia Other     History  Substance Use Topics  . Smoking status: Former Smoker    Types: Cigars    Quit date: 09/28/2011  . Smokeless tobacco: Never Used  . Alcohol  Use: No    OB History    Grav Para Term Preterm Abortions TAB SAB Ect Mult Living   2 1 1  0 1 0 1 0 0 1      Review of Systems  All other systems reviewed and are negative.    Allergies  Morphine and related; Aspirin; Other; Penicillins; and Vicodin  Home Medications   Current Outpatient Rx  Name Route Sig Dispense Refill  . ALBUTEROL SULFATE HFA 108 (90 BASE) MCG/ACT IN AERS Inhalation Inhale 2 puffs into the lungs every 4 (four) hours as needed for wheezing or shortness of breath (for asthma). 1 Inhaler 1  . ALPRAZOLAM 0.5 MG PO TABS Oral Take 1 tablet (0.5 mg total) by mouth at bedtime as needed for sleep or anxiety. 30 tablet 0  . BUDESONIDE-FORMOTEROL FUMARATE 80-4.5 MCG/ACT IN AERO Inhalation Inhale 2 puffs into the lungs 2 (two) times daily. 1 Inhaler 5  . FAMOTIDINE 20 MG PO TABS Oral Take 1 tablet (20 mg total) by mouth 2 (two) times daily. 60 tablet 3  . FERROUS SULFATE 325 (65 FE) MG PO TABS Oral Take 1 tablet (325 mg total) by mouth daily  with breakfast. 30 tablet 11  . GABAPENTIN 100 MG PO CAPS Oral Take 100 mg by mouth 3 (three) times daily.    Marland Kitchen HYDROCODONE-ACETAMINOPHEN 5-325 MG PO TABS Oral Take 1 tablet by mouth every 6 (six) hours as needed.    . IBUPROFEN 200 MG PO TABS Oral Take 2 tablets (400 mg total) by mouth every 8 (eight) hours as needed for pain. 100 tablet 2  . MEGESTROL ACETATE 40 MG PO TABS Oral Take 80 mg by mouth 2 (two) times daily.    Marland Kitchen NAFARELIN ACETATE 2 MG/ML NA SOLN Nasal Place into the nose 2 (two) times daily.    . WARFARIN SODIUM 5 MG PO TABS  Take as directed by anticoagulation clinic provider. Dispense 50 tablets = 1 month supply. 50 tablet 2    BP 137/91  Resp 24  SpO2 100%  LMP 01/28/2012  Physical Exam  Nursing note and vitals reviewed. Constitutional: She is oriented to person, place, and time. She appears well-developed and well-nourished.  Non-toxic appearance. No distress.  HENT:  Head: Normocephalic and atraumatic.  Eyes:  Conjunctivae, EOM and lids are normal. Pupils are equal, round, and reactive to light.  Neck: Normal range of motion. Neck supple. No tracheal deviation present. No mass present.  Cardiovascular: Regular rhythm and normal heart sounds.  Tachycardia present.  Exam reveals no gallop.   No murmur heard. Pulmonary/Chest: No stridor. She is in respiratory distress. She has decreased breath sounds. She has wheezes. She has no rhonchi. She has no rales.  Abdominal: Soft. Normal appearance and bowel sounds are normal. She exhibits no distension. There is no tenderness. There is no rebound and no CVA tenderness.  Musculoskeletal: Normal range of motion. She exhibits no edema and no tenderness.  Neurological: She is alert and oriented to person, place, and time. She has normal strength. No cranial nerve deficit or sensory deficit. GCS eye subscore is 4. GCS verbal subscore is 5. GCS motor subscore is 6.  Skin: Skin is warm and dry. No abrasion and no rash noted.  Psychiatric: She has a normal mood and affect. Her speech is normal and behavior is normal.    ED Course  Procedures (including critical care time)   Labs Reviewed  PROTIME-INR   No results found.   No diagnosis found.    MDM  Patient's chest CT was negative for new pulmonary embolism when compared to prior. She was given albuterol prior to arrival the lungs are clear at this time. She was very anxious and given Ativan. She was taken off the oxygen and her pulse oximetry is 98% room air. Her wheezes have disappeared. Patient is now stable for discharge and given prescriptions    Date: 02/20/2012  Rate: 120  Rhythm: sinus tachycardia  QRS Axis: normal  Intervals: normal  ST/T Wave abnormalities: nonspecific ST changes  Conduction Disutrbances:none  Narrative Interpretation:   Old EKG Reviewed: none available        Toy Baker, MD 01/28/12 2203  Toy Baker, MD 02/20/12 1701

## 2012-01-28 NOTE — Progress Notes (Signed)
Agree with plan 

## 2012-01-28 NOTE — ED Notes (Signed)
Per EMS:  Pt was found in severe respiratory distress, pt was just released today from [facility], was diagnosed with a PE within the last week.  EMS st's breath sounds were very diminished, airways very tight.  Pt received 10 albuterol, 0.5 of atrovent, and 125 of solu-medrol.  Resp effort has slightly improved.

## 2012-01-28 NOTE — ED Notes (Signed)
EAV:[id number]<JX> Expected date:<BR> Expected time:<BR> Means of arrival:<BR> Comments:<BR> EMS-resp. distress

## 2012-01-28 NOTE — Progress Notes (Signed)
Subjective:    Patient ID: Megan Chandler, female    DOB: Nov 27, 1975, 35 y.o.   MRN: [id number]  HPI Comments: 36 y.o woman PMH PE (11/2011 with risk factors immobility after back surgery 07/2011 and NuVA ring (removed 11/2011), asthma, anxiety, endometriosis, adenomyosis, and menometororrhagia presents for appt to disc and coordinate options of treating her vaginal bleeding.  Pt has had a h/o vaginal bleeding x 7 mos associated with lower ab cramping x 7 mos.  She takes Hydrocodone to help with the pain.  At times she has felt dizzy/lightheaded (denies today).  She has required a blood transfusion in the past due to anemia.  She reports ~2 days ago she bought a 36 ct super tampons and has used all but 5-10.  The bleeding is daily, constant.  She went to her OB/GYN Dr. Tamela Oddi at [facility] last week who discussed options of Novasure (ablation) vs uterine artery embolization with the patient to stop her vaginal bleeding.  The treatment options need to be reviewed by the PCP and coordinated with the OB/GYN because the pt is on Coumadin since 11/2011 for her PE.  She has another appt with her OB/GYN 02/05/2012.   Since her PE 11/2011 the patient experiences intermittent chest pain with breathing and exertional sob.  She is afraid to take advil for her chest pain due to increased risk of bleeding.  Pt takes Hydrocodone 5-325 mg q4-6 hours for her back pain s/p back surgery.  She uses this same medication to help with her chest pain with breathing.  Pt noted a scare last Thursday/Friday prior to this office visit where she had chest pain. She could not tell if it was heartburn or the previous feeling she felt when she was dx with a clot in her lungs.  She stated she was scared.  She tried moving around and it did not help the chest pian and then she rested on the edge of the bed.  At that time she did not seek medical attention.    Pt is out of albuterol Rx which she takes for asthma-needs Rx refill    SH: pt has 31 y.o child     Review of Systems  Constitutional: Positive for unexpected weight change. Negative for fever and chills.       Wt gain 139-->157 on Megace for vag bleeding  Respiratory: Positive for shortness of breath.        Sob with exertion  Cardiovascular: Positive for chest pain.       Chest pain with exertion  Gastrointestinal:       Denies constipation  Genitourinary: Positive for vaginal bleeding.       Objective:   Physical Exam  Nursing note and vitals reviewed. Constitutional: She is oriented to person, place, and time. Vital signs are normal. She appears well-developed and well-nourished. She is cooperative. No distress.  HENT:  Head: Normocephalic and atraumatic.  Mouth/Throat: Oropharynx is clear and moist and mucous membranes are normal. No oropharyngeal exudate.    Eyes: Conjunctivae are normal. Pupils are equal, round, and reactive to light. No scleral icterus.  Cardiovascular: Normal rate, regular rhythm, S1 normal, S2 normal and normal heart sounds.   No murmur heard. Pulmonary/Chest: Effort normal and breath sounds normal. No respiratory distress. She has no wheezes. She exhibits tenderness.    Abdominal: Soft. Bowel sounds are normal. She exhibits no distension. There is no tenderness.  Neurological: She is alert and oriented to person, place, and time.  Skin: Skin is warm, dry and intact. No rash noted. She is not diaphoretic.  Psychiatric: Her speech is normal and behavior is normal. Her mood appears anxious.       Pt speaking in soft voice          Assessment & Plan:  1 month

## 2012-01-28 NOTE — Assessment & Plan Note (Signed)
BP controlled today 117/78

## 2012-01-29 NOTE — Progress Notes (Signed)
I saw patient and discussed her care with resident Dr. Shirlee Latch.  I agree with the clinical findings and plans as outlined in her note.  Patient has ongoing vaginal bleeding that is followed by her gynecologist Dr. Tamela Oddi, and is currently on warfarin for a pulmonary embolism diagnosed in May of this year.  We will need to coordinate management of patient's anti-coagulation with Dr. Tamela Oddi, and this will depend on her recommendations regarding the management of patient's vaginal bleeding.  Patient is a little over 2 months into her anticoagulation therapy, and if warfarin needs to be held for a non-urgent GYN procedure, then the risk of recurrent venous thromboembolism would be lower if the procedure could be delayed for a few more weeks until patient has completed 3 months of therapy per current guidelines.  The question of whether to bridge patient with heparin whenever anti-coagulation is held will also need to be addressed, as well as the question of overall duration of anticoagulation.

## 2012-01-29 NOTE — Addendum Note (Signed)
Addended by: Bufford Spikes on: 01/29/2012 10:03 AM   Modules accepted: Orders

## 2012-01-30 ENCOUNTER — Telehealth: Payer: Self-pay | Admitting: Internal Medicine

## 2012-01-30 NOTE — Telephone Encounter (Signed)
Spoke with Dr. Tamela Oddi (patients OB/GYN) (cell phone # [phone]).  For further concerns or questions you may call her anytime  Dr. Tamela Oddi stated that intervention for vaginal bleeding in this patient is not urgent.  Advised Dr. Tamela Oddi that if she thought intervention was necessary immediately probably the best treatment decision would be to wait until after the patient had been anticoagulated for 3 months before doing any procedure.  She has prescribed her a GnRH agonist, which should not be used long term.  Also this medication does not have any effect of duration of treatment with Coumadin.  She has not diagnosed the patient with endometriosis and it is currently unknown why the patient has excessive vaginal bleeding.  She states the risk of bleeding with procedures such as hysteroscopy, ablation, or embolization is low, even if the patient is on Coumadin.  She does not necessarily like doing procedures on patients who are on anticoagulation.  She states that Coumadin would not have to be stopped for the procedure, no bridging with heparin will be necessary.  Dr. Tamela Oddi plans to try hysteroscopy first.  She would recommend ablation > embolization, but if she did do ablation it will have to be done in stags and she would have to sterilize the patient first.    Of note for PCP Dr. Dorise Hiss:  As the time of procedure approaches, discussion between PCP and Dr. Tamela Oddi will need to be had on if Dr. Tamela Oddi would like the patient to be off Coumadin for the procedure and plan of care from that point.  Pending records to be faxed from OB/GYN office ([facility])-when they get here Shirlee Latch will review and have scanned into chart   [facility]

## 2012-02-10 ENCOUNTER — Encounter: Payer: Self-pay | Admitting: Internal Medicine

## 2012-02-10 ENCOUNTER — Ambulatory Visit (INDEPENDENT_AMBULATORY_CARE_PROVIDER_SITE_OTHER): Payer: Medicaid Other | Admitting: Pharmacist

## 2012-02-10 ENCOUNTER — Encounter (HOSPITAL_COMMUNITY): Payer: Self-pay | Admitting: *Deleted

## 2012-02-10 ENCOUNTER — Other Ambulatory Visit: Payer: Self-pay | Admitting: Obstetrics & Gynecology

## 2012-02-10 ENCOUNTER — Inpatient Hospital Stay (HOSPITAL_COMMUNITY)
Admission: AD | Admit: 2012-02-10 | Discharge: 2012-02-10 | Disposition: A | Discharge status: Hospice | Payer: Medicaid Other | Source: Ambulatory Visit | Attending: Obstetrics & Gynecology | Admitting: Obstetrics & Gynecology

## 2012-02-10 ENCOUNTER — Ambulatory Visit (INDEPENDENT_AMBULATORY_CARE_PROVIDER_SITE_OTHER): Payer: Medicaid Other | Admitting: Internal Medicine

## 2012-02-10 VITALS — BP 115/65 | HR 88 | Temp 99.2°F | Resp 20 | Ht 65.5 in | Wt 163.4 lb

## 2012-02-10 DIAGNOSIS — N926 Irregular menstruation, unspecified: Secondary | ICD-10-CM

## 2012-02-10 DIAGNOSIS — Z954 Presence of other heart-valve replacement: Secondary | ICD-10-CM

## 2012-02-10 DIAGNOSIS — N921 Excessive and frequent menstruation with irregular cycle: Secondary | ICD-10-CM

## 2012-02-10 DIAGNOSIS — Z7901 Long term (current) use of anticoagulants: Secondary | ICD-10-CM

## 2012-02-10 DIAGNOSIS — N920 Excessive and frequent menstruation with regular cycle: Secondary | ICD-10-CM | POA: Insufficient documentation

## 2012-02-10 DIAGNOSIS — I2699 Other pulmonary embolism without acute cor pulmonale: Secondary | ICD-10-CM

## 2012-02-10 DIAGNOSIS — I059 Rheumatic mitral valve disease, unspecified: Secondary | ICD-10-CM

## 2012-02-10 DIAGNOSIS — I824Y9 Acute embolism and thrombosis of unspecified deep veins of unspecified proximal lower extremity: Secondary | ICD-10-CM

## 2012-02-10 DIAGNOSIS — I359 Nonrheumatic aortic valve disorder, unspecified: Secondary | ICD-10-CM

## 2012-02-10 DIAGNOSIS — I749 Embolism and thrombosis of unspecified artery: Secondary | ICD-10-CM

## 2012-02-10 DIAGNOSIS — D6859 Other primary thrombophilia: Secondary | ICD-10-CM

## 2012-02-10 DIAGNOSIS — I4891 Unspecified atrial fibrillation: Secondary | ICD-10-CM

## 2012-02-10 DIAGNOSIS — D649 Anemia, unspecified: Secondary | ICD-10-CM

## 2012-02-10 LAB — TYPE AND SCREEN: ABO/RH(D): O POS

## 2012-02-10 LAB — CBC
HCT: 31.2 % — ABNORMAL LOW (ref 36.0–46.0)
Hemoglobin: 9.9 g/dL — ABNORMAL LOW (ref 12.0–15.0)
Hemoglobin: 9.5 g/dL — ABNORMAL LOW (ref 12.0–15.0)
MCH: 28.3 pg (ref 26.0–34.0)
MCHC: 31.7 g/dL (ref 30.0–36.0)
MCHC: 31.1 g/dL (ref 30.0–36.0)
MCV: 89.7 fL (ref 78.0–100.0)
MCV: 90.8 fL (ref 78.0–100.0)
RBC: 3.48 MIL/uL — ABNORMAL LOW (ref 3.87–5.11)
RDW: 20.3 % — ABNORMAL HIGH (ref 11.5–15.5)

## 2012-02-10 LAB — POCT INR: INR: 2.6

## 2012-02-10 NOTE — Progress Notes (Signed)
Anti-Coagulation Progress Note  KEYONA EMRICH is a 36 y.o. female who is currently on an anti-coagulation regimen.    RECENT RESULTS: Recent results are below, the most recent result is correlated with a dose of 42.5 mg. per week: Lab Results  Component Value Date   INR 2.6 02/10/2012   INR 2.35* 01/28/2012   INR 3.0 01/27/2012    ANTI-COAG DOSE:   Latest dosing instructions   Total Sun Mon Tue Wed Thu Fri Sat   42.5 5 mg 7.5 mg 5 mg 7.5 mg 5 mg 7.5 mg 5 mg    (5 mg1) (5 mg1.5) (5 mg1) (5 mg1.5) (5 mg1) (5 mg1.5) (5 mg1)         ANTICOAG SUMMARY: Anticoagulation Episode Summary              Current INR goal 2.0-3.0 Next INR check 02/24/2012   INR from last check 2.6 (02/10/2012)     Weekly max dose (mg)  Target end date    Indications Encounter for long-term (current) use of anticoagulants, Pulmonary embolism with infarction   INR check location [facility]    Send INR reminders to    Comments Orthopedic (back) surgery Jan 2013. States in May 2013 developed PE and "blood clot on my heart". Recently hospitalized in [place] and discharged from [place] last week.             ANTICOAG TODAY: Anticoagulation Summary as of 02/10/2012              INR goal 2.0-3.0     Selected INR 2.6 (02/10/2012) Next INR check 02/24/2012   Weekly max dose (mg)  Target end date    Indications Encounter for long-term (current) use of anticoagulants, Pulmonary embolism with infarction    Anticoagulation Episode Summary              INR check location [facility]    Send INR reminders to    Comments Orthopedic (back) surgery Jan 2013. States in May 2013 developed PE and "blood clot on my heart". Recently hospitalized in [place] and discharged from [place] last week.             PATIENT INSTRUCTIONS: Patient Instructions  Patient instructed to take medications as defined in the Anti-coagulation Track section of this encounter.  Patient instructed to  take today's dose.  Patient verbalized understanding of these instructions.        FOLLOW-UP Return in 2 weeks (on 02/24/2012) for Follow up INR at 2PM.  Hulen Luster, III Pharm.D., CACP

## 2012-02-10 NOTE — Patient Instructions (Signed)
[facility] bleeding. We will call and talk to them about your case and give them our input and advice. Do not take your coumadin today. If you are having dizziness or worsening bleeding please call our clinic. Our number is [phone].

## 2012-02-10 NOTE — Assessment & Plan Note (Signed)
She does have chronic iron deficiency anemia and her anemia is slightly worse at today's visit. We'll have close followup at [facility] today. Advised her if they decide not to do anything that she should call us back please for further workup or hospital stay.

## 2012-02-10 NOTE — Progress Notes (Signed)
I discussed patient with PCP Dr. Dorise Hiss and with Dr. Alexandria Lodge.  Her HGB has dropped from 12.2 on 7/23 to 9.9 today; she has been on warfarin for almost 3 months, which is the minimal recommended duration of therapy.   Patient's initial PE was extensive and occurred in the setting of a contraceptive ring and smoking; she has stopped smoking, and the ring was removed previously, but she is now on megestrol which confers some risk of thromboembolism.  She would likely benefit from a longer course of warfarin, but warfarin cannot safely be continued without definitive treatment of her uterine bleeding.  She will be seen today at [facility], and Dr. Dorise Hiss will discuss with Dr. Tamela Oddi.  If she needs to be off of anticoagulation for definitive GYN treatment, then bridging as an inpatient would likely be the best option.

## 2012-02-10 NOTE — Progress Notes (Signed)
Subjective:     Patient ID: Megan Chandler, female   DOB: [birth year], 36 y.o.   MRN: [id number]  HPI The patient is a 36 year old female who comes in today for a followup of her chronic medical problems. She is having excessive bleeding at this time she states that since Thursday of last week she has been needing it upwards of 30 pads/tampons per day. She states that she has been having quite a few clots and blood passing from her vagina. She states that for the last several months she hasn't bleeding off-and-on and she has gone to see OB/GYN for this. They're not sure exactly what treatment will be necessary at this time however may try to do a embolization BIR or endometrial ablation with hysteroscopy. We have been coordinating with them regarding her warfarin dosing as she has had recent PE approximately 12 weeks ago. She is not feeling dizzy at today's visit is not having any weakness. She states that she doesn't really feel that well today. No chest pain. No new shortness of breath. Her INR is 2.6 at today's check. She states that she does have an appointment over at [facility] immediately following this appointment. Patient states she fell and hit her knee about 1 week ago but that the swelling and pain is mostly gone at this time.   Review of Systems  Constitutional: Negative.   Eyes: Negative.   Respiratory: Negative.   Cardiovascular: Negative.   Gastrointestinal: Negative.   Genitourinary: Positive for vaginal bleeding. Negative for urgency, hematuria, decreased urine volume, vaginal discharge, vaginal pain, menstrual problem and pelvic pain.  Musculoskeletal: Negative.   Neurological: Negative.  Negative for dizziness, tremors, seizures, syncope, speech difficulty, weakness, light-headedness, numbness and headaches.  Hematological: Bruises/bleeds easily.       Objective:   Physical Exam  Constitutional: She is oriented to person, place, and time. She appears well-developed and  well-nourished.  HENT:  Head: Normocephalic and atraumatic.  Eyes: EOM are normal. Pupils are equal, round, and reactive to light.       Very tearful  Neck: Normal range of motion. Neck supple.  Cardiovascular: Normal rate and regular rhythm.   Pulmonary/Chest: Effort normal and breath sounds normal.  Abdominal: Soft. Bowel sounds are normal.  Neurological: She is alert and oriented to person, place, and time.  Skin: Skin is warm and dry.       Assessment/Plan:   1. Please see problem oriented charting.  2. Disposition-the patient will be discharged to [facility] She does have a ride to get there. Her CBC did show that her hemoglobin is slightly decreased from one week ago. One week ago it was 12 and today it is 9.9. Did personally speak on the phone with Dr. Tamela Oddi who is her OB/GYN doctor. Have advised her that we will like her OB/GYN procedure to be scheduled sooner rather than later if they can do that up at all. Did discuss that we would not like to hold her Coumadin if she is just going to get hysteroscopy with ablation. Have advised the patient that if she is feeling worse dizzy weak they decided not to do something at [facility] hospital she should call us for close followup.

## 2012-02-10 NOTE — Patient Instructions (Signed)
Patient instructed to take medications as defined in the Anti-coagulation Track section of this encounter.  Patient instructed to take today's dose.  Patient verbalized understanding of these instructions.    

## 2012-02-10 NOTE — MAU Provider Note (Signed)
Mal Misty y.o.G2P1011 @Unknown  by LMP Chief Complaint  Patient presents with  . Vaginal Bleeding   First Provider Initiated Contact with Patient 02/10/12 1908    SUBJECTIVE  HPI: The patient is a 36 year old gravida 2 para [id number] was sent to maternity admissions from her internist's office for vaginal bleeding for about 7 months. Dr. Tamela Oddi has been treating her menometrorrhagia with Megace and a GnRH analog. For about 1 1/2 weeks she has been having excessive bleeding, using a 36 pack of tampons in one day. She reports mild cramping on and off similar to normal menstrual cramps. Patient is currently on Coumadin for a blood clot in the lung and was instructed to stop taking it today by Dr. Audley Hose. She denies dizziness or syncope. She states the bleeding has lessened over the past 2 days.  Past Medical History  Diagnosis Date  . Anemia   . Urinary tract infection   . Chronic low back pain     buldging disc  . Endometriosis   . Pneumonia     as a child  . Bronchitis   . Headache     often but no migraines  . Arthritis   . History of bladder infections   . Hypertension     but not on medication  . Bartholin's cyst     hx of  . Asthma   . Pulmonary embolism     11/2011  . Menometrorrhagia   . Adenomyosis    Past Surgical History  Procedure Date  . Dilation and curettage of uterus   . Cholecystectomy, laparoscopic 1998  . Umbilical hernia repair     as a child  . Cesarean section 1996  . Laparoscopy at age 74  . Cervical spine surgery 2009  . Back surgery     07/2011  . Lumbar spine surgery    History   Social History  . Marital Status: Single    Spouse Name: N/A    Number of Children: N/A  . Years of Education: N/A   Occupational History  . Not on file.   Social History Main Topics  . Smoking status: Former Smoker    Types: Cigars    Quit date: 09/28/2011  . Smokeless tobacco: Never Used  . Alcohol Use: No  . Drug Use: No  . Sexually Active:  Not Currently    Birth Control/ Protection: None     Nuvaring   Other Topics Concern  . Not on file   Social History Narrative  . No narrative on file   No current facility-administered medications on file prior to encounter.   Current Outpatient Prescriptions on File Prior to Encounter  Medication Sig Dispense Refill  . albuterol (PROVENTIL HFA;VENTOLIN HFA) 108 (90 BASE) MCG/ACT inhaler Inhale 2 puffs into the lungs every 4 (four) hours as needed for wheezing or shortness of breath (for asthma).  1 Inhaler  1  . ALPRAZolam (XANAX) 0.5 MG tablet Take 0.5 mg by mouth at bedtime as needed. For sleep.      . budesonide-formoterol (SYMBICORT) 80-4.5 MCG/ACT inhaler Inhale 2 puffs into the lungs 2 (two) times daily.  1 Inhaler  5  . famotidine (PEPCID) 20 MG tablet Take 1 tablet (20 mg total) by mouth 2 (two) times daily.  60 tablet  3  . ferrous sulfate (FERROUSUL) 325 (65 FE) MG tablet Take 1 tablet (325 mg total) by mouth daily with breakfast.  30 tablet  11  . gabapentin (NEURONTIN) 100 MG  capsule Take 100 mg by mouth 3 (three) times daily.      Marland Kitchen HYDROcodone-acetaminophen (NORCO/VICODIN) 5-325 MG per tablet Take 1 tablet by mouth every 6 (six) hours as needed. For pain.      . megestrol (MEGACE) 40 MG tablet Take 80 mg by mouth 2 (two) times daily.      . Nafarelin Acetate (SYNAREL) 2 MG/ML SOLN Place into the nose 2 (two) times daily.      . predniSONE (DELTASONE) 10 MG tablet Take 2 tablets (20 mg total) by mouth daily.  10 tablet  0  . warfarin (COUMADIN) 5 MG tablet Take 5-7.5 mg by mouth daily.       Allergies  Allergen Reactions  . Morphine And Related Anaphylaxis  . Aspirin Itching  . Penicillins Itching  . Vicodin (Hydrocodone-Acetaminophen) Itching    ROS: Pertinent items in HPI  OBJECTIVE Blood pressure 119/70, pulse 92, temperature 98.3 F (36.8 C), temperature source Oral, resp. rate 16, height 5\' 5"  (1.651 m), weight 73.936 kg (163 lb), last menstrual period  01/28/2012, SpO2 100.00%.  GENERAL: Well-developed, well-nourished female in no acute distress. No pallor HEENT: Normocephalic, good dentition HEART: normal rate RESP: normal effort ABDOMEN: Soft, nontender EXTREMITIES: Nontender, no edema NEURO: Alert and oriented SPECULUM EXAM: NEFG, small-moderate amount of bright red blood noted, scant blood oozing from cervix, cervix clean. BIMANUAL: cervix close; uterus NSSP; no adnexal tenderness or masses   LAB RESULTS  Results for orders placed during the hospital encounter of 02/10/12 (from the past 24 hour(s))  CBC     Status: Abnormal   Collection Time   02/10/12  6:26 PM      Component Value Range   WBC 12.1 (*) 4.0 - 10.5 K/uL   RBC 3.36 (*) 3.87 - 5.11 MIL/uL   Hemoglobin 9.5 (*) 12.0 - 15.0 g/dL   HCT 30.8 (*) 65.7 - 84.6 %   MCV 90.8  78.0 - 100.0 fL   MCH 28.3  26.0 - 34.0 pg   MCHC 31.1  30.0 - 36.0 g/dL   RDW 96.2 (*) 95.2 - 84.1 %   Platelets 319  150 - 400 K/uL    IMAGING N/A  ASSESSMENT 1. Menometrorrhagia, bleeding stable   2. Warfarin anticoagulation   3.   Anemia  PLAN Discharge home per Dr. Tamela Oddi. We'll move up date of ablation to next week. Bleeding precautions. Call office and morning to find out when ablation will be scheduled. Medication List  As of 02/10/2012  7:41 PM   ASK your doctor about these medications         albuterol 108 (90 BASE) MCG/ACT inhaler   Commonly known as: PROVENTIL HFA;VENTOLIN HFA   Inhale 2 puffs into the lungs every 4 (four) hours as needed for wheezing or shortness of breath (for asthma).      ALPRAZolam 0.5 MG tablet   Commonly known as: XANAX   Take 0.5 mg by mouth at bedtime as needed. For sleep.      budesonide-formoterol 80-4.5 MCG/ACT inhaler   Commonly known as: SYMBICORT   Inhale 2 puffs into the lungs 2 (two) times daily.      famotidine 20 MG tablet   Commonly known as: PEPCID   Take 1 tablet (20 mg total) by mouth 2 (two) times daily.      ferrous  sulfate 325 (65 FE) MG tablet   Take 1 tablet (325 mg total) by mouth daily with breakfast.      gabapentin  100 MG capsule   Commonly known as: NEURONTIN   Take 100 mg by mouth 3 (three) times daily.      HYDROcodone-acetaminophen 5-325 MG per tablet   Commonly known as: NORCO/VICODIN   Take 1 tablet by mouth every 6 (six) hours as needed. For pain.      megestrol 40 MG tablet   Commonly known as: MEGACE   Take 80 mg by mouth 2 (two) times daily.      predniSONE 10 MG tablet   Commonly known as: DELTASONE   Take 2 tablets (20 mg total) by mouth daily.      SYNAREL 2 MG/ML Soln   Generic drug: Nafarelin Acetate   Place into the nose 2 (two) times daily.      warfarin 5 MG tablet   Commonly known as: COUMADIN   Take 5-7.5 mg by mouth daily.           Follow-up Information    Follow up with Antionette Char A, MD in 1 day.   Contact information:   [address] [place] [address] [phone]       Follow up with [facility]. (As needed if symptoms worsen)    Contact information:   [address] [place] [address] [phone]        Dorathy Kinsman 02/10/2012 7:41 PM

## 2012-02-10 NOTE — MAU Note (Signed)
Patient states she has been having vaginal bleeding for about 7 months. For about 1 1/2 weeks she has been having excessive bleeding with using 16 tampons in one hour. Has some cramping on and off. Patient is currently on Coumadin for a blood clot in the lung and was instructed to stop taking it today. Was sent for evaluation and probable admission.

## 2012-02-10 NOTE — Assessment & Plan Note (Signed)
On chronic anti-coagulation. She is approaching 12 week mark of her therapy. Would consider her at high please risk at this time. Would consider at next followup visit when there may be more appropriate to stop her Coumadin therapy altogether versus continuing her on Coumadin therapy after the 3 month mark which is rapidly approaching. We'll see her back in 2 weeks for close followup

## 2012-02-10 NOTE — Assessment & Plan Note (Signed)
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 She has had a drop of 2 in her hemoglobin in the past week. She will be going to [facility] directly following this hospital visit and I did talk directly to Dr. Tamela Oddi, her OB/GYN regarding Coumadin duration and cessation and possible procedures and risk of bleeding and need for stopping Coumadin versus staying on Coumadin during procedure. Did give her our office number and tell her that if she has any questions during this course she should feel free to call us.

## 2012-02-11 ENCOUNTER — Telehealth: Payer: Self-pay | Admitting: *Deleted

## 2012-02-11 ENCOUNTER — Other Ambulatory Visit: Payer: Self-pay | Admitting: Obstetrics & Gynecology

## 2012-02-11 LAB — ABO/RH: ABO/RH(D): O POS

## 2012-02-11 NOTE — OR Nursing (Signed)
Dr. Malen Gauze and [facility] CRNA notified that pt is fully coagulated with Coumadin per Dr. Tamela Oddi.

## 2012-02-11 NOTE — Telephone Encounter (Signed)
Pt called in stating she was seen in clinic yesterday.  She wanted you to know her GYN doctor, Dr Tamela Oddi moved her surgery date to August 23, she has concerns about this.  Also at [facility] hospital yesterday they did not do any thing about her bleeding.  Please call pt at #  [phone]

## 2012-02-12 ENCOUNTER — Ambulatory Visit (HOSPITAL_COMMUNITY): Payer: Medicaid Other | Admitting: Anesthesiology

## 2012-02-12 ENCOUNTER — Encounter (HOSPITAL_COMMUNITY): Payer: Self-pay | Admitting: *Deleted

## 2012-02-12 ENCOUNTER — Encounter (HOSPITAL_COMMUNITY): Payer: Self-pay | Admitting: Anesthesiology

## 2012-02-12 ENCOUNTER — Encounter (HOSPITAL_COMMUNITY)
Admission: RE | Disposition: A | Discharge status: Home / Self Care | Payer: Self-pay | Source: Ambulatory Visit | Attending: Obstetrics & Gynecology

## 2012-02-12 ENCOUNTER — Ambulatory Visit (HOSPITAL_COMMUNITY)
Admission: RE | Admit: 2012-02-12 | Discharge: 2012-02-13 | Disposition: A | Discharge status: Home / Self Care | Payer: Medicaid Other | Source: Ambulatory Visit | Attending: Obstetrics & Gynecology | Admitting: Obstetrics & Gynecology

## 2012-02-12 DIAGNOSIS — R0602 Shortness of breath: Secondary | ICD-10-CM

## 2012-02-12 DIAGNOSIS — D649 Anemia, unspecified: Secondary | ICD-10-CM

## 2012-02-12 DIAGNOSIS — K219 Gastro-esophageal reflux disease without esophagitis: Secondary | ICD-10-CM

## 2012-02-12 DIAGNOSIS — N921 Excessive and frequent menstruation with irregular cycle: Secondary | ICD-10-CM

## 2012-02-12 DIAGNOSIS — Z302 Encounter for sterilization: Secondary | ICD-10-CM | POA: Insufficient documentation

## 2012-02-12 DIAGNOSIS — N920 Excessive and frequent menstruation with regular cycle: Secondary | ICD-10-CM | POA: Insufficient documentation

## 2012-02-12 DIAGNOSIS — N926 Irregular menstruation, unspecified: Secondary | ICD-10-CM

## 2012-02-12 HISTORY — PX: LAPAROSCOPIC TUBAL LIGATION: SHX1937

## 2012-02-12 LAB — PROTIME-INR: INR: 2.81 — ABNORMAL HIGH (ref 0.00–1.49)

## 2012-02-12 LAB — TYPE AND SCREEN
ABO/RH(D): O POS
Antibody Screen: NEGATIVE

## 2012-02-12 LAB — CBC
MCV: 90.9 fL (ref 78.0–100.0)
Platelets: 368 10*3/uL (ref 150–400)
RDW: 20.3 % — ABNORMAL HIGH (ref 11.5–15.5)
WBC: 8.9 10*3/uL (ref 4.0–10.5)

## 2012-02-12 LAB — SURGICAL PCR SCREEN: MRSA, PCR: POSITIVE — AB

## 2012-02-12 SURGERY — DILATATION & CURETTAGE/HYSTEROSCOPY WITH NOVASURE ABLATION
Anesthesia: General | Site: Uterus | Wound class: Clean Contaminated

## 2012-02-12 MED ORDER — MIDAZOLAM HCL 5 MG/5ML IJ SOLN
INTRAMUSCULAR | Status: DC | PRN
  Administered 2012-02-12: 2 mg via INTRAVENOUS

## 2012-02-12 MED ORDER — HYDROMORPHONE HCL PF 1 MG/ML IJ SOLN
INTRAMUSCULAR | Status: AC
  Administered 2012-02-12: 0.5 mg via INTRAVENOUS
  Filled 2012-02-12: qty 1

## 2012-02-12 MED ORDER — WARFARIN - PHYSICIAN DOSING INPATIENT
Freq: Every day | Status: DC
  Administered 2012-02-12: 17:00:00

## 2012-02-12 MED ORDER — ALPRAZOLAM 0.5 MG PO TABS
0.5000 mg | ORAL_TABLET | Freq: Every evening | ORAL | Status: DC | PRN
  Administered 2012-02-12: 0.5 mg via ORAL
  Filled 2012-02-12: qty 1

## 2012-02-12 MED ORDER — LIDOCAINE HCL (CARDIAC) 20 MG/ML IV SOLN
INTRAVENOUS | Status: DC | PRN
  Administered 2012-02-12: 80 mg via INTRAVENOUS

## 2012-02-12 MED ORDER — ROCURONIUM BROMIDE 50 MG/5ML IV SOLN
INTRAVENOUS | Status: AC
  Filled 2012-02-12: qty 1

## 2012-02-12 MED ORDER — MIDAZOLAM HCL 2 MG/2ML IJ SOLN
INTRAMUSCULAR | Status: AC
  Filled 2012-02-12: qty 2

## 2012-02-12 MED ORDER — FENTANYL CITRATE 0.05 MG/ML IJ SOLN
INTRAMUSCULAR | Status: AC
  Filled 2012-02-12: qty 5

## 2012-02-12 MED ORDER — DEXAMETHASONE SODIUM PHOSPHATE 4 MG/ML IJ SOLN
INTRAMUSCULAR | Status: DC | PRN
  Administered 2012-02-12: 10 mg via INTRAVENOUS

## 2012-02-12 MED ORDER — ONDANSETRON HCL 4 MG/2ML IJ SOLN: 4.0000 mg | Freq: Four times a day (QID) | INTRAMUSCULAR | Status: DC | PRN

## 2012-02-12 MED ORDER — HYDROMORPHONE HCL PF 1 MG/ML IJ SOLN
0.2500 mg | INTRAMUSCULAR | Status: DC | PRN
  Administered 2012-02-12 (×3): 0.5 mg via INTRAVENOUS

## 2012-02-12 MED ORDER — ALBUTEROL SULFATE HFA 108 (90 BASE) MCG/ACT IN AERS
2.0000 | INHALATION_SPRAY | Freq: Four times a day (QID) | RESPIRATORY_TRACT | Status: DC
  Administered 2012-02-12 – 2012-02-13 (×2): 2 via RESPIRATORY_TRACT
  Filled 2012-02-12: qty 6.7

## 2012-02-12 MED ORDER — MEGESTROL ACETATE 40 MG PO TABS
120.0000 mg | ORAL_TABLET | Freq: Two times a day (BID) | ORAL | Status: DC
  Administered 2012-02-12 (×2): 120 mg via ORAL
  Filled 2012-02-12 (×3): qty 3

## 2012-02-12 MED ORDER — GLYCOPYRROLATE 0.2 MG/ML IJ SOLN
INTRAMUSCULAR | Status: AC
  Filled 2012-02-12: qty 2

## 2012-02-12 MED ORDER — WARFARIN SODIUM 5 MG PO TABS
7.5000 mg | ORAL_TABLET | ORAL | Status: DC
  Administered 2012-02-12: 7.5 mg via ORAL
  Filled 2012-02-12: qty 1.5

## 2012-02-12 MED ORDER — WARFARIN SODIUM 5 MG PO TABS: 5.0000 mg | ORAL_TABLET | ORAL | Status: DC

## 2012-02-12 MED ORDER — KCL IN DEXTROSE-NACL 20-5-0.45 MEQ/L-%-% IV SOLN
INTRAVENOUS | Status: DC
  Administered 2012-02-12 – 2012-02-13 (×2): via INTRAVENOUS
  Filled 2012-02-12 (×4): qty 1000

## 2012-02-12 MED ORDER — DEXAMETHASONE SODIUM PHOSPHATE 10 MG/ML IJ SOLN
INTRAMUSCULAR | Status: AC
  Filled 2012-02-12: qty 1

## 2012-02-12 MED ORDER — ACETAMINOPHEN 500 MG PO TABS
1000.0000 mg | ORAL_TABLET | Freq: Four times a day (QID) | ORAL | Status: DC | PRN
  Administered 2012-02-12 – 2012-02-13 (×2): 1000 mg via ORAL
  Filled 2012-02-12: qty 2

## 2012-02-12 MED ORDER — PREDNISONE 20 MG PO TABS
20.0000 mg | ORAL_TABLET | Freq: Every day | ORAL | Status: DC
  Administered 2012-02-12 – 2012-02-13 (×2): 20 mg via ORAL
  Filled 2012-02-12 (×2): qty 1

## 2012-02-12 MED ORDER — LACTATED RINGERS IV SOLN
INTRAVENOUS | Status: DC
  Administered 2012-02-12: 09:00:00 via INTRAVENOUS

## 2012-02-12 MED ORDER — LIDOCAINE HCL (CARDIAC) 20 MG/ML IV SOLN
INTRAVENOUS | Status: AC
  Filled 2012-02-12: qty 5

## 2012-02-12 MED ORDER — ONDANSETRON HCL 4 MG/2ML IJ SOLN
INTRAMUSCULAR | Status: DC | PRN
  Administered 2012-02-12: 4 mg via INTRAVENOUS

## 2012-02-12 MED ORDER — PROPOFOL 10 MG/ML IV EMUL
INTRAVENOUS | Status: DC | PRN
  Administered 2012-02-12: 200 mg via INTRAVENOUS

## 2012-02-12 MED ORDER — DIPHENHYDRAMINE HCL 50 MG/ML IJ SOLN
12.5000 mg | Freq: Once | INTRAMUSCULAR | Status: AC
  Administered 2012-02-12: 12.5 mg via INTRAVENOUS

## 2012-02-12 MED ORDER — GABAPENTIN 100 MG PO CAPS
100.0000 mg | ORAL_CAPSULE | Freq: Three times a day (TID) | ORAL | Status: DC
  Administered 2012-02-12 (×2): 100 mg via ORAL
  Filled 2012-02-12 (×3): qty 1

## 2012-02-12 MED ORDER — ROCURONIUM BROMIDE 100 MG/10ML IV SOLN
INTRAVENOUS | Status: DC | PRN
  Administered 2012-02-12: 40 mg via INTRAVENOUS

## 2012-02-12 MED ORDER — ZOLPIDEM TARTRATE 5 MG PO TABS
5.0000 mg | ORAL_TABLET | Freq: Every evening | ORAL | Status: DC | PRN
  Administered 2012-02-12: 5 mg via ORAL
  Filled 2012-02-12: qty 1

## 2012-02-12 MED ORDER — BUPIVACAINE HCL (PF) 0.25 % IJ SOLN
INTRAMUSCULAR | Status: DC | PRN
  Administered 2012-02-12: 10 mL

## 2012-02-12 MED ORDER — LACTATED RINGERS IV SOLN
INTRAVENOUS | Status: DC | PRN
  Administered 2012-02-12: 1 via INTRAVENOUS

## 2012-02-12 MED ORDER — LIDOCAINE HCL 1 % IJ SOLN
INTRAMUSCULAR | Status: DC | PRN
  Administered 2012-02-12: 10 mL

## 2012-02-12 MED ORDER — ONDANSETRON HCL 4 MG/2ML IJ SOLN
INTRAMUSCULAR | Status: AC
  Filled 2012-02-12: qty 2

## 2012-02-12 MED ORDER — DIPHENHYDRAMINE HCL 50 MG/ML IJ SOLN
INTRAMUSCULAR | Status: AC
  Administered 2012-02-12: 12.5 mg via INTRAVENOUS
  Filled 2012-02-12: qty 1

## 2012-02-12 MED ORDER — TRAMADOL HCL 50 MG PO TABS
100.0000 mg | ORAL_TABLET | Freq: Four times a day (QID) | ORAL | Status: DC
  Administered 2012-02-12 – 2012-02-13 (×3): 100 mg via ORAL
  Filled 2012-02-12 (×2): qty 2
  Filled 2012-02-12: qty 1

## 2012-02-12 MED ORDER — BUDESONIDE-FORMOTEROL FUMARATE 80-4.5 MCG/ACT IN AERO
2.0000 | INHALATION_SPRAY | Freq: Two times a day (BID) | RESPIRATORY_TRACT | Status: DC
  Administered 2012-02-12 (×2): 2 via RESPIRATORY_TRACT
  Filled 2012-02-12: qty 6.9

## 2012-02-12 MED ORDER — ONDANSETRON HCL 4 MG PO TABS: 4.0000 mg | ORAL_TABLET | Freq: Four times a day (QID) | ORAL | Status: DC | PRN

## 2012-02-12 MED ORDER — FAMOTIDINE 20 MG PO TABS
20.0000 mg | ORAL_TABLET | Freq: Two times a day (BID) | ORAL | Status: DC
  Administered 2012-02-12: 20 mg via ORAL
  Filled 2012-02-12: qty 1

## 2012-02-12 MED ORDER — MUPIROCIN 2 % EX OINT: TOPICAL_OINTMENT | Freq: Two times a day (BID) | CUTANEOUS | Status: DC

## 2012-02-12 MED ORDER — BUPIVACAINE HCL (PF) 0.25 % IJ SOLN
INTRAMUSCULAR | Status: AC
  Filled 2012-02-12: qty 30

## 2012-02-12 MED ORDER — PROPOFOL 10 MG/ML IV EMUL
INTRAVENOUS | Status: AC
  Filled 2012-02-12: qty 20

## 2012-02-12 MED ORDER — METOCLOPRAMIDE HCL 5 MG/ML IJ SOLN: 10.0000 mg | Freq: Once | INTRAMUSCULAR | Status: DC | PRN

## 2012-02-12 MED ORDER — NEOSTIGMINE METHYLSULFATE 1 MG/ML IJ SOLN
INTRAMUSCULAR | Status: DC | PRN
  Administered 2012-02-12: 4 mg via INTRAVENOUS

## 2012-02-12 MED ORDER — ACETAMINOPHEN 10 MG/ML IV SOLN
1000.0000 mg | Freq: Once | INTRAVENOUS | Status: AC
  Administered 2012-02-12: 1000 mg via INTRAVENOUS
  Filled 2012-02-12: qty 100

## 2012-02-12 MED ORDER — WARFARIN SODIUM 7.5 MG PO TABS: 7.5000 mg | ORAL_TABLET | ORAL | Status: DC

## 2012-02-12 MED ORDER — GLYCOPYRROLATE 0.2 MG/ML IJ SOLN
INTRAMUSCULAR | Status: DC | PRN
  Administered 2012-02-12: .4 mg via INTRAVENOUS

## 2012-02-12 MED ORDER — MUPIROCIN 2 % EX OINT
TOPICAL_OINTMENT | CUTANEOUS | Status: AC
  Filled 2012-02-12: qty 22

## 2012-02-12 MED ORDER — FENTANYL CITRATE 0.05 MG/ML IJ SOLN
INTRAMUSCULAR | Status: DC | PRN
  Administered 2012-02-12 (×5): 50 ug via INTRAVENOUS
  Administered 2012-02-12 (×2): 100 ug via INTRAVENOUS

## 2012-02-12 MED ORDER — WARFARIN SODIUM 5 MG PO TABS
5.0000 mg | ORAL_TABLET | Freq: Every day | ORAL | Status: DC
  Administered 2012-02-12: 7.5 mg via ORAL

## 2012-02-12 MED ORDER — LACTATED RINGERS IV SOLN
INTRAVENOUS | Status: DC | PRN
  Administered 2012-02-12 (×2): via INTRAVENOUS

## 2012-02-12 MED ORDER — MUPIROCIN 2 % EX OINT
TOPICAL_OINTMENT | Freq: Two times a day (BID) | CUTANEOUS | Status: DC
  Administered 2012-02-12: 1 via NASAL

## 2012-02-12 MED ORDER — MEPERIDINE HCL 25 MG/ML IJ SOLN: 6.2500 mg | INTRAMUSCULAR | Status: DC | PRN

## 2012-02-12 MED ORDER — NAFARELIN ACETATE 2 MG/ML NA SOLN
4.0000 mL | Freq: Two times a day (BID) | NASAL | Status: DC
  Administered 2012-02-12: 8 mg via NASAL

## 2012-02-12 MED ORDER — FERROUS SULFATE 325 (65 FE) MG PO TABS: 325.0000 mg | ORAL_TABLET | Freq: Every day | ORAL | Status: DC

## 2012-02-12 SURGICAL SUPPLY — 22 items
ABLATOR ENDOMETRIAL BIPOLAR (ABLATOR) ×3 IMPLANT
ADH SKN CLS APL DERMABOND .7 (GAUZE/BANDAGES/DRESSINGS) ×2
CANISTER SUCTION 2500CC (MISCELLANEOUS) ×3 IMPLANT
CATH ROBINSON RED A/P 16FR (CATHETERS) ×3 IMPLANT
CHLORAPREP W/TINT 26ML (MISCELLANEOUS) ×3 IMPLANT
CLOTH BEACON ORANGE TIMEOUT ST (SAFETY) ×3 IMPLANT
CONTAINER PREFILL 10% NBF 60ML (FORM) ×6 IMPLANT
DERMABOND ADVANCED (GAUZE/BANDAGES/DRESSINGS) ×1
DERMABOND ADVANCED .7 DNX12 (GAUZE/BANDAGES/DRESSINGS) ×2 IMPLANT
GLOVE BIO SURGEON STRL SZ 6.5 (GLOVE) ×6 IMPLANT
GOWN PREVENTION PLUS LG XLONG (DISPOSABLE) ×6 IMPLANT
GOWN STRL REIN XL XLG (GOWN DISPOSABLE) ×3 IMPLANT
NDL SPNL 20GX3.5 QUINCKE YW (NEEDLE) IMPLANT
NEEDLE SPNL 20GX3.5 QUINCKE YW (NEEDLE) IMPLANT
PACK HYSTEROSCOPY LF (CUSTOM PROCEDURE TRAY) ×3 IMPLANT
PACK LAPAROSCOPY BASIN (CUSTOM PROCEDURE TRAY) ×3 IMPLANT
SUT VIC AB 3-0 PS2 18 (SUTURE)
SUT VIC AB 3-0 PS2 18XBRD (SUTURE) IMPLANT
SUT VICRYL 0 UR6 27IN ABS (SUTURE) IMPLANT
TOWEL OR 17X24 6PK STRL BLUE (TOWEL DISPOSABLE) ×6 IMPLANT
TROCAR XCEL NON-BLD 11X100MML (ENDOMECHANICALS) ×3 IMPLANT
WATER STERILE IRR 1000ML POUR (IV SOLUTION) ×3 IMPLANT

## 2012-02-12 NOTE — Op Note (Signed)
Procedure Note  Megan Chandler 36 y.o. 02/12/2012  Preoperative Diagnosis:  Multiparity, desires a sterilization procedure, AUB  Postoperative Diagnosis: Same  Procedure: Laparoscopic bilateral tubal ligation with fulguration, diagnostic hysteroscopy, Novasure ablation  Surgeon: Antionette Char A  Indications:  The patient now presents for a sterilization procedure and Novasure ablation after discussing therapeutic alternatives.        Procedure Detail:  The patient was taken to the operating room and was placed on the operating table in the dorsal supine position.  After his satisfactory general anesthesia was achieved, the patient was placed in the semi-lithotomy position using Allen stirrups. The patient was prepped and draped in the usual sterile manner for a vaginal laparoscopic procedure. A speculum was placed in the vagina. The anterior lip of the cervix was grasped with a single-tooth tenaculum. A Hulka manipulator was then advanced into the uterus and secured  to the anterior lip of the cervix as a means to manipulate the uterus. The single-tooth tenaculum was then removed. The infraumbilical region was then anesthetized with local anesthesia, 0.25%  Marcaine. A small incision was made to the skin and subcutaneous tissue. A 10 mm Optiview trocar was placed through the incision into the abdominal cavity diagnostic laparoscope with video camera attached was placed through the trocar sleeve and carbon dioxide was used to insufflate the abdominal and pelvic cavity. The pelvic contents were examined and the findings were described above. Kleppinger bipolar forceps were inserted through the operative port of the laparoscope. The left fallopian tube was identified and traced out to its fimbriated end. Then starting at the distal isthmus the proximal ampullary portion of the tube was coagulated in 4 contiguous areas. Each time the resistance meter went to 0 and the tube had been retracted away  from an adjacent viscera. The right fallopian tube was then manipulated in a similar fashion. The Hulka manipulator was removed.  The anterior lip of the cervix was grasped with a single-toothed tenaculum.  The endocervical canal sounded to 4 cm.  The uterine cavity sounded to 9 cm.  The endocervical canal was dilated with Shawnie Pons dilators.  A 5 mm diagnostic hysteroscope with Glycine as the distending medium was used to perform a diagnostic hysteroscopy.  The hysteroscope was removed. The cervical canal was further dilated with Pratt dilators to a #29.  The Novasure device was test-fired and the meter went between 4.5 and 5.  The device was withdrawn into the cylinder and placed into the uterus.  The dorsal fin was set appropriately.  The device was deployed and then seated.  The sleeve was placed up against the endocervix.  A cavity assessment test was performed and passed.  The ablation cycle was completed. The scope was removed and the excess carbon dioxide was remove through the port, before it was removed.   Dermabond was applied to the skin incision. The instruments removed from the vagina. There was minimal bleeding from the cervix. Final sponge, instrument and needle counts were correct. The patient was awakened on the operating table and taken to the PACU in satisfactory condition.    Findings: Globular, anteverted uterus.  There were synechiae at the uterine fundus noted at the time of hysteroscopy.  Blood in endometrial cavity limited visualization.  Incomplete assessment of the extent of the ablation after the procedure--the sidewalls appeared to have not been ablated.  Estimated Blood Loss:  Minimal              Total IV Fluids:  Per  Anesthesthesiology        Condition: stable

## 2012-02-12 NOTE — Transfer of Care (Signed)
Immediate Anesthesia Transfer of Care Note  Patient: Megan Chandler  Procedure(s) Performed: Procedure(s) (LRB): DILATATION & CURETTAGE/HYSTEROSCOPY WITH NOVASURE ABLATION (N/A) LAPAROSCOPIC TUBAL LIGATION (Bilateral)  Patient Location: PACU  Anesthesia Type: General  Level of Consciousness: awake, sedated, patient cooperative and responds to stimulation  Airway & Oxygen Therapy: Patient Spontanous Breathing and Patient connected to [place] O2.  Post-op Assessment: Report given to PACU RN, Post -op Vital signs reviewed and stable and Patient moving all extremities  Post vital signs: Reviewed and stable  Complications: No apparent anesthesia complications

## 2012-02-12 NOTE — Anesthesia Postprocedure Evaluation (Signed)
Anesthesia Post Note  Patient: Megan Chandler  Procedure(s) Performed: Procedure(s) (LRB): DILATATION & CURETTAGE/HYSTEROSCOPY WITH NOVASURE ABLATION (N/A) LAPAROSCOPIC TUBAL LIGATION (Bilateral)  Anesthesia type: General  Patient location: PACU  Post pain: Pain level controlled  Post assessment: Post-op Vital signs reviewed  Last Vitals: There were no vitals filed for this visit.  Post vital signs: Reviewed  Level of consciousness: sedated  Complications: No apparent anesthesia complicationsfj

## 2012-02-12 NOTE — Telephone Encounter (Signed)
Talked with Dr Rogelia Boga and Dr Meredith Pel about this yesterday and we are fine with OB GYN making appropriate decisions regarding her OB GYN problems. Dr Rogelia Boga felt like there was a scheduled event for this week in [facility] and perhaps she has not been notified. You can schedule her a follow up with [place] the beginning of next week if they have not done a procedure then for lab work only.   Dr. Dorise Hiss

## 2012-02-12 NOTE — Anesthesia Procedure Notes (Signed)
Procedure Name: Intubation Date/Time: 02/12/2012 11:02 AM Performed by: Jessica Priest Pre-anesthesia Checklist: Patient identified, Emergency Drugs available, Suction available and Patient being monitored Patient Re-evaluated:Patient Re-evaluated prior to inductionOxygen Delivery Method: Circle System Utilized Preoxygenation: Pre-oxygenation with 100% oxygen Intubation Type: IV induction Ventilation: Mask ventilation without difficulty Laryngoscope Size: Mac and 3 Grade View: Grade II Tube type: Oral Tube size: 7.0 mm Number of attempts: 1 Airway Equipment and Method: stylet and oral airway Placement Confirmation: ETT inserted through vocal cords under direct vision,  positive ETCO2 and breath sounds checked- equal and bilateral Secured at: 21 cm Tube secured with: Tape Dental Injury: Teeth and Oropharynx as per pre-operative assessment

## 2012-02-12 NOTE — H&P (Signed)
  Chief Complaint: 36 y.o.  para 1 who presents with AUB  Details of Present Illness: The patient was diagnosed with a pulmonary embolism several months ago while using the Nuva Ring--?recent pregnancy.  She has a long h/o AUB.  Radiologic studies have been suggestive of adenomyosis.  The bleeding is refractory to medical management in the setting of Coumadin.  Ht 5\' 5"  (1.651 m)  Wt 73.936 kg (163 lb)  BMI 27.12 kg/m2  LMP 01/28/2012  Past Medical History  Diagnosis Date  . Anemia   . Urinary tract infection   . Chronic low back pain     buldging disc  . Endometriosis   . Pneumonia     as a child  . Bronchitis   . Headache     often but no migraines  . Arthritis   . History of bladder infections   . Hypertension     but not on medication  . Bartholin's cyst     hx of  . Asthma   . Pulmonary embolism     11/2011  . Menometrorrhagia   . Adenomyosis    History   Social History  . Marital Status: Single    Spouse Name: N/A    Number of Children: N/A  . Years of Education: N/A   Occupational History  . Not on file.   Social History Main Topics  . Smoking status: Former Smoker    Types: Cigars    Quit date: 09/28/2011  . Smokeless tobacco: Never Used  . Alcohol Use: No  . Drug Use: No  . Sexually Active: Not Currently    Birth Control/ Protection: None     Nuvaring   Other Topics Concern  . Not on file   Social History Narrative  . No narrative on file   Family History  Problem Relation Age of Onset  . Hypertension Mother   . Cancer Maternal Aunt   . Cancer Maternal Uncle   . Diabetes Maternal Grandmother   . Anesthesia problems Neg Hx   . Hypotension Neg Hx   . Malignant hyperthermia Neg Hx   . Pseudochol deficiency Neg Hx   . Stroke Other     aunt  . Sickle cell anemia Other     Pertinent items are noted in HPI.  Pre-Op Diagnosis: AUB-E, C   Planned Procedure: Procedure(s): DILATATION & CURETTAGE/HYSTEROSCOPY WITH NOVASURE  ABLATION LAPAROSCOPIC TUBAL LIGATION  I have reviewed the patient's history and have completed the physical exam and Megan Chandler is acceptable for surgery.  [name], MD 02/12/2012 10:32 AM

## 2012-02-12 NOTE — Anesthesia Preprocedure Evaluation (Addendum)
Anesthesia Evaluation    Airway Mallampati: III TM Distance: >3 FB Neck ROM: Full    Dental No notable dental hx. (+) Chipped and Teeth Intact,    Pulmonary asthma , pneumonia -, resolved, former smoker,  Hx/o PTE breath sounds clear to auscultation  Pulmonary exam normal       Cardiovascular hypertension, Rhythm:Regular Rate:Normal     Neuro/Psych  Headaches, Anxiety    GI/Hepatic Neg liver ROS, GERD-  Medicated and Controlled,  Endo/Other  negative endocrine ROS  Renal/GU negative Renal ROS  negative genitourinary   Musculoskeletal negative musculoskeletal ROS (+)   Abdominal   Peds  Hematology Anticoagulated on Coumadin for recent Hx/o PTE. Last dose 5pm   Anesthesia Other Findings Pierced Tongue  Reproductive/Obstetrics Endometriosis  Menometorrhagia                           Anesthesia Physical Anesthesia Plan  ASA: II  Anesthesia Plan: General   Post-op Pain Management:    Induction: Intravenous  Airway Management Planned: Oral ETT  Additional Equipment:   Intra-op Plan:   Post-operative Plan: Extubation in OR  Informed Consent: I have reviewed the patients History and Physical, chart, labs and discussed the procedure including the risks, benefits and alternatives for the proposed anesthesia with the patient or authorized representative who has indicated his/her understanding and acceptance.   Dental advisory given  Plan Discussed with: CRNA, Anesthesiologist and Surgeon  Anesthesia Plan Comments:        Anesthesia Quick Evaluation

## 2012-02-12 NOTE — Progress Notes (Signed)
Pharmacist - Physician Communication Dr: Tamela Oddi Concerning: Pharmacy Care Issues Regarding Warfarin Labs  DESCRIPTION: While hospitalized, to be in compliance with the Joint Commission National Patient Safety Goals, all patients on warfarin must have a baseline and/or current protime prior to the administration of warfarin as well as a daily protime for 3 days..  Pharmacy has received your order for warfarin without these required laboratory assessments (daily PT/INR x 3 days).  RECOMMENDATION: A baseline and daily protime for three days have been ordered to meet this safety goal and comply with the current [facility] policy.  The Pharmacy will defer all warfarin order changes and follow-up of lab results to the prescriber unless a "pharmacy Coumadin protocol" is ordered.

## 2012-02-12 NOTE — Telephone Encounter (Signed)
Pt called to inform but no answer.  Message left for her to call clinic.

## 2012-02-12 NOTE — Progress Notes (Signed)
Dr. Malen Gauze aware of patient taking coumdin yesterday.

## 2012-02-13 ENCOUNTER — Encounter (HOSPITAL_COMMUNITY): Payer: Self-pay | Admitting: Obstetrics & Gynecology

## 2012-02-13 ENCOUNTER — Telehealth: Payer: Self-pay | Admitting: Pharmacist

## 2012-02-13 ENCOUNTER — Other Ambulatory Visit: Payer: Self-pay | Admitting: Internal Medicine

## 2012-02-13 DIAGNOSIS — I2699 Other pulmonary embolism without acute cor pulmonale: Secondary | ICD-10-CM

## 2012-02-13 DIAGNOSIS — D649 Anemia, unspecified: Secondary | ICD-10-CM

## 2012-02-13 LAB — CBC
MCH: 28.4 pg (ref 26.0–34.0)
MCHC: 31.2 g/dL (ref 30.0–36.0)
Platelets: 340 10*3/uL (ref 150–400)

## 2012-02-13 LAB — PROTIME-INR: Prothrombin Time: 35.3 seconds — ABNORMAL HIGH (ref 11.6–15.2)

## 2012-02-13 MED ORDER — OXYCODONE-ACETAMINOPHEN 10-325 MG PO TABS: 1.0000 | ORAL_TABLET | Freq: Four times a day (QID) | ORAL | Status: AC | PRN

## 2012-02-13 NOTE — Discharge Summary (Signed)
Physician Discharge Summary  Patient ID: Megan Chandler MRN: [id number] DOB/AGE: 1975-10-18 36 y.o.  Admit date: 02/12/2012 Discharge date: 02/13/2012  Admission Diagnoses: Menometrorrhagia  Discharge Diagnoses: Same Active Problems:  * No active hospital problems. *    Discharged Condition: good  Hospital Course: Patient underwent Endometrial Ablation procedure without complications.  Post op course uncomplicated.  Discharged home in good condition.  Consults: None  Significant Diagnostic Studies: labs: CBC  Treatments: IV hydration, analgesia: Percocet and surgery: Endometrial Ablation.  Discharge Exam: Blood pressure 124/76, pulse 75, temperature 98 F (36.7 C), temperature source Oral, resp. rate 18, height 5\' 5"  (1.651 m), weight 73.936 kg (163 lb), last menstrual period 01/28/2012, SpO2 99.00%. General appearance: alert and no distress Abdomen:  Soft, nontender Vagina:  Scant bleeding  Disposition: 50-Hospice/Home  Discharge Orders    Future Appointments: Provider: Department: Dept Phone: Center:   02/24/2012 2:00 PM [facility] [id number] [facility]     Future Orders Please Complete By Expires   CBC   02/10/13   Diet - low sodium heart healthy      Increase activity slowly      Discharge instructions      Comments:   Routine.   Call MD for:      Call MD for:  temperature >100.4      Call MD for:  persistant nausea and vomiting      Call MD for:  severe uncontrolled pain      Call MD for:  redness, tenderness, or signs of infection (pain, swelling, redness, odor or green/yellow discharge around incision site)      Call MD for:  difficulty breathing, headache or visual disturbances      Call MD for:  hives      Call MD for:  persistant dizziness or light-headedness      Call MD for:  extreme fatigue      Driving Restrictions      Comments:   No driving for 24 hours.   Lifting restrictions      Comments:   No lifting greater than 30  lbs.   Sexual Activity Restrictions      Comments:   No sex for 2 weeks.   Other Restrictions      Comments:   Routine post Ablation.     Medication List  As of 02/13/2012  9:25 AM   STOP taking these medications         traMADol 50 MG tablet         TAKE these medications         albuterol 108 (90 BASE) MCG/ACT inhaler   Commonly known as: PROVENTIL HFA;VENTOLIN HFA   Inhale 2 puffs into the lungs every 4 (four) hours as needed. Takes for shortness of breath      ALPRAZolam 0.5 MG tablet   Commonly known as: XANAX   Take 0.5 mg by mouth at bedtime as needed. For sleep.      budesonide-formoterol 80-4.5 MCG/ACT inhaler   Commonly known as: SYMBICORT   Inhale 2 puffs into the lungs 2 (two) times daily.      famotidine 20 MG tablet   Commonly known as: PEPCID   Take 1 tablet (20 mg total) by mouth 2 (two) times daily.      ferrous sulfate 325 (65 FE) MG tablet   Take 1 tablet (325 mg total) by mouth daily with breakfast.      gabapentin 100 MG capsule  Commonly known as: NEURONTIN   Take 100 mg by mouth 3 (three) times daily.      megestrol 40 MG tablet   Commonly known as: MEGACE   Take 120 mg by mouth 2 (two) times daily.      oxyCODONE-acetaminophen 10-325 MG per tablet   Commonly known as: PERCOCET   Take 1 tablet by mouth every 6 (six) hours as needed for pain.      predniSONE 10 MG tablet   Commonly known as: DELTASONE   Take 2 tablets (20 mg total) by mouth daily.      SYNAREL 2 MG/ML Soln   Generic drug: Nafarelin Acetate   Place 4 mLs into the nose 2 (two) times daily.      warfarin 5 MG tablet   Commonly known as: COUMADIN   Take 5-7.5 mg by mouth daily. Pt takes 7.5mg  Monday, Wednesday, and Friday. Pt takes 5mg  all other days of the week.           Follow-up Information    Follow up with Antionette Char A, MD. Schedule an appointment as soon as possible for a visit in 2 weeks.   Contact information:   [address] [place] [address] [phone]          Signed: Brock Bad 02/13/2012, 9:25 AM

## 2012-02-13 NOTE — Anesthesia Postprocedure Evaluation (Signed)
  Anesthesia Post-op Note  Patient: Megan Chandler  Procedure(s) Performed: Procedure(s) (LRB): DILATATION & CURETTAGE/HYSTEROSCOPY WITH NOVASURE ABLATION (N/A) LAPAROSCOPIC TUBAL LIGATION (Bilateral)  Patient Location: [facility]  Anesthesia Type: General  Level of Consciousness: awake  Airway and Oxygen Therapy: Patient Spontanous Breathing  Post-op Pain: mild  Post-op Assessment: Patient's Cardiovascular Status Stable and Respiratory Function Stable  Post-op Vital Signs: stable  Complications: No apparent anesthesia complications

## 2012-02-13 NOTE — Telephone Encounter (Signed)
Called by Linus Mako PharmD at [facility] reporting that today INR = 3.46 up from 2.81 yesterday. During her hospitalization at [facility] they did not interrupt her warfarin therapy based upon the procedure being performed as well as the fact that patient is just nearing 54mo diagnosis of PE. Patient had been discharged from hospital and they could not react to today's INR to adjust dose. They asked if I could do so. I called the patient and advised that she is to OMIT/HOLD today's dose of warfarin. She will take only 1x5mg  warfarin by mouth for the next 3 days commencing with tomorrows dose. She has been instructed to RTC on Monday 12-AUG-13 and have fingerstick INR performed. Attending Physician will have to react to this as I am on vacation/paid annual leave from hospital and will not be available to make dose adjustment(s). Patient has been made aware of this. Patient verbalizes understanding of verbal instructions provided to her by read-back. Hulen Luster PharmD.

## 2012-02-13 NOTE — Telephone Encounter (Signed)
I have put in order for INR and CBC.

## 2012-02-13 NOTE — Progress Notes (Signed)
Subjective: Patient reports vaginal bleeding like a light period.  Objective: I have reviewed patient's vital signs, intake and output, medications and labs.  General: alert and no distress GI: soft, non-tender; bowel sounds normal; no masses,  no organomegaly Vaginal Bleeding: minimal   Assessment/Plan: Menorrhagia.  Stable after Endometrial Ablation.  Discharge home.  F/U in office in 2 weeks.  LOS: 1 day    Math Brazie A 02/13/2012, 9:10 AM

## 2012-02-13 NOTE — Addendum Note (Signed)
Addendum  created 02/13/12 1610 by Renford Dills, CRNA   Modules edited:Notes Section

## 2012-02-14 ENCOUNTER — Other Ambulatory Visit: Payer: Self-pay | Admitting: *Deleted

## 2012-02-14 NOTE — Telephone Encounter (Signed)
No, this will be denied. The patient did not discuss this problem or need for this medication at our last visit. I do not know why she needs this medicine.   Thanks,  Dr. Dorise Hiss

## 2012-02-17 NOTE — Telephone Encounter (Signed)
Pt has appointment 8/19 in clinic

## 2012-02-18 ENCOUNTER — Telehealth: Payer: Self-pay | Admitting: *Deleted

## 2012-02-18 NOTE — Telephone Encounter (Signed)
Message copied by Rocco Pauls on Tue Feb 18, 2012  2:31 PM ------      Message from: Genella Mech A      Created: Tue Feb 18, 2012  2:18 PM       Hi,             I was just informed by lab that this patient did not keep lab appointment scheduled to check CBC and INR. We would like to have her come for this. Is it possible to call her and try to bring her in to lab when she can come, even without lab appointment?            Thanks,            Dr. Dorise Hiss

## 2012-02-19 ENCOUNTER — Telehealth: Payer: Self-pay | Admitting: *Deleted

## 2012-02-19 NOTE — Telephone Encounter (Signed)
Pt will call back this pm

## 2012-02-20 ENCOUNTER — Telehealth: Payer: Self-pay | Admitting: *Deleted

## 2012-02-20 NOTE — Telephone Encounter (Signed)
Return pt's call. Stated she could not come in today for labs; wants to come tomorrow at 1100AM. Also stated she is having chest pain and may need to go to the ED. Pt instructed to go to the ED if she's having chest pains. She agreed.  Dr Dorise Hiss awared.

## 2012-02-21 ENCOUNTER — Other Ambulatory Visit (INDEPENDENT_AMBULATORY_CARE_PROVIDER_SITE_OTHER): Payer: Medicaid Other

## 2012-02-21 ENCOUNTER — Other Ambulatory Visit: Payer: Self-pay | Admitting: *Deleted

## 2012-02-21 ENCOUNTER — Other Ambulatory Visit: Payer: Medicaid Other

## 2012-02-21 DIAGNOSIS — N926 Irregular menstruation, unspecified: Secondary | ICD-10-CM

## 2012-02-21 DIAGNOSIS — Z7901 Long term (current) use of anticoagulants: Secondary | ICD-10-CM

## 2012-02-21 DIAGNOSIS — I059 Rheumatic mitral valve disease, unspecified: Secondary | ICD-10-CM

## 2012-02-21 DIAGNOSIS — D649 Anemia, unspecified: Secondary | ICD-10-CM

## 2012-02-21 DIAGNOSIS — I2699 Other pulmonary embolism without acute cor pulmonale: Secondary | ICD-10-CM

## 2012-02-21 DIAGNOSIS — I4891 Unspecified atrial fibrillation: Secondary | ICD-10-CM

## 2012-02-21 DIAGNOSIS — I749 Embolism and thrombosis of unspecified artery: Secondary | ICD-10-CM

## 2012-02-21 DIAGNOSIS — I359 Nonrheumatic aortic valve disorder, unspecified: Secondary | ICD-10-CM

## 2012-02-21 DIAGNOSIS — D6859 Other primary thrombophilia: Secondary | ICD-10-CM

## 2012-02-21 DIAGNOSIS — I824Y9 Acute embolism and thrombosis of unspecified deep veins of unspecified proximal lower extremity: Secondary | ICD-10-CM

## 2012-02-21 DIAGNOSIS — Z954 Presence of other heart-valve replacement: Secondary | ICD-10-CM

## 2012-02-21 LAB — POCT INR: INR: 2.8

## 2012-02-21 LAB — CBC
HCT: 35.2 % — ABNORMAL LOW (ref 36.0–46.0)
Hemoglobin: 11.3 g/dL — ABNORMAL LOW (ref 12.0–15.0)
MCH: 29.4 pg (ref 26.0–34.0)
MCHC: 32.1 g/dL (ref 30.0–36.0)
MCV: 91.4 fL (ref 78.0–100.0)
RDW: 17.8 % — ABNORMAL HIGH (ref 11.5–15.5)

## 2012-02-21 NOTE — Telephone Encounter (Signed)
At pt's visit w/ you 11/28/11 this medication was reordered #20, last month 7/11 #30 was ordered by ni[facility], pt states she is out of medication now and would really like foe it to be filled.

## 2012-02-21 NOTE — Telephone Encounter (Signed)
I have never refilled this medication and do not feel it is appropriate. I will see her as scheduled and address concerns. Do not feel that it should be filled by our clinic. If she is taking it at home she is getting it from somewhere else.   Dr. Dorise Hiss

## 2012-02-21 NOTE — Telephone Encounter (Signed)
Pt desires an appt mon w/ dr Dorise Hiss for f/u "of my blood clot and see where i'm at with that" appt is given for 1330 8/19 w/ dr Dorise Hiss and dr Alexandria Lodge

## 2012-02-21 NOTE — Telephone Encounter (Signed)
Spoke w/ dr Dorise Hiss and Megan Chandler, Megan Chandler states she will speak w/ dr Dorise Hiss Monday, she was very pleasant

## 2012-02-24 ENCOUNTER — Ambulatory Visit (INDEPENDENT_AMBULATORY_CARE_PROVIDER_SITE_OTHER): Payer: Medicaid Other | Admitting: Pharmacist

## 2012-02-24 ENCOUNTER — Ambulatory Visit: Payer: Medicaid Other

## 2012-02-24 ENCOUNTER — Ambulatory Visit (INDEPENDENT_AMBULATORY_CARE_PROVIDER_SITE_OTHER): Payer: Medicaid Other | Admitting: Internal Medicine

## 2012-02-24 ENCOUNTER — Encounter: Payer: Self-pay | Admitting: Internal Medicine

## 2012-02-24 VITALS — BP 121/79 | HR 87 | Temp 99.1°F | Ht 66.0 in | Wt 164.3 lb

## 2012-02-24 DIAGNOSIS — I824Y9 Acute embolism and thrombosis of unspecified deep veins of unspecified proximal lower extremity: Secondary | ICD-10-CM

## 2012-02-24 DIAGNOSIS — Z954 Presence of other heart-valve replacement: Secondary | ICD-10-CM

## 2012-02-24 DIAGNOSIS — F411 Generalized anxiety disorder: Secondary | ICD-10-CM

## 2012-02-24 DIAGNOSIS — I4891 Unspecified atrial fibrillation: Secondary | ICD-10-CM

## 2012-02-24 DIAGNOSIS — N921 Excessive and frequent menstruation with irregular cycle: Secondary | ICD-10-CM

## 2012-02-24 DIAGNOSIS — I2699 Other pulmonary embolism without acute cor pulmonale: Secondary | ICD-10-CM

## 2012-02-24 DIAGNOSIS — I749 Embolism and thrombosis of unspecified artery: Secondary | ICD-10-CM

## 2012-02-24 DIAGNOSIS — I359 Nonrheumatic aortic valve disorder, unspecified: Secondary | ICD-10-CM

## 2012-02-24 DIAGNOSIS — N920 Excessive and frequent menstruation with regular cycle: Secondary | ICD-10-CM

## 2012-02-24 DIAGNOSIS — D6859 Other primary thrombophilia: Secondary | ICD-10-CM

## 2012-02-24 DIAGNOSIS — I059 Rheumatic mitral valve disease, unspecified: Secondary | ICD-10-CM

## 2012-02-24 DIAGNOSIS — Z7901 Long term (current) use of anticoagulants: Secondary | ICD-10-CM

## 2012-02-24 DIAGNOSIS — F419 Anxiety disorder, unspecified: Secondary | ICD-10-CM

## 2012-02-24 MED ORDER — ALPRAZOLAM 0.5 MG PO TABS: 0.5000 mg | ORAL_TABLET | Freq: Every evening | ORAL | Status: DC | PRN

## 2012-02-24 MED ORDER — CITALOPRAM HYDROBROMIDE 20 MG PO TABS: 20.0000 mg | ORAL_TABLET | Freq: Every day | ORAL | Status: DC

## 2012-02-24 NOTE — Assessment & Plan Note (Signed)
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 Given infarction will do 6 month therapy with Coumadin and then stop. This was a first rst blood clot with precipitant of smoking, birth control. Does not warrant lifelong therapy at this time.

## 2012-02-24 NOTE — Assessment & Plan Note (Signed)
Patient did recently have endometrial ablation done by OB/GYN. She does have followup visit with them today. She did stop bleeding after procedure however has started bleeding again this morning. We'll continue to coordinate with them regarding any necessary adjustment of Coumadin. Hemoglobin stable at 11.3 on Friday.

## 2012-02-24 NOTE — Progress Notes (Signed)
Subjective:     Patient ID: Megan Chandler, female   DOB: Mar 05, 1976, 37 y.o.   MRN: [id number]  HPI The patient is a 36 year old female who comes in today for a followup visit from an OB/GYN procedure, she is also having some abdominal pain and chest discomfort at today's visit. She states that the chest discomfort is similar to after she had her PE. She has been worked up for PE again since that time and has not had new PE. She did have significant burden of clot some she may be still having some pains from that. Her abdominal pain is crampy in nature and did start when she started bleeding again this morning. She recently had ablation done from OB/GYN. She was having significant menometrorrhagia. She did not have significant bleeding with procedure however stated that the bleeding did stop for little while and started up again this morning. She does have OB/GYN appointment immediately following this appointment. The main reason for her visit is a refill on her Xanax. During past visits she does mention that she takes it for anxiety however we never discussed her anxiety. I did take the opportunity today to do that. She states that she takes it when she is feeling kind of depressed and overwhelmed. She does not some and she has anxiety attacks etc. She takes it only once a day at bedtime. She states she has never been on any medication for depression and has never done any kind of counseling.   Review of Systems  Constitutional: Negative for fever, chills, activity change, appetite change, fatigue and unexpected weight change.  HENT: Negative for nosebleeds.   Respiratory: Negative for cough, choking, chest tightness, shortness of breath and wheezing.   Cardiovascular: Positive for chest pain. Negative for palpitations and leg swelling.  Gastrointestinal: Positive for abdominal pain. Negative for nausea, vomiting, diarrhea, constipation, blood in stool, abdominal distention and anal bleeding.    Genitourinary: Positive for vaginal bleeding. Negative for dysuria, urgency, frequency, hematuria, flank pain, decreased urine volume, vaginal discharge, enuresis, difficulty urinating, genital sores, vaginal pain, menstrual problem, pelvic pain and dyspareunia.  Musculoskeletal: Negative for myalgias, back pain, joint swelling, arthralgias and gait problem.  Skin: Negative.   Neurological: Negative.   Psychiatric/Behavioral: Positive for dysphoric mood and decreased concentration. Negative for suicidal ideas, hallucinations, behavioral problems, confusion, disturbed wake/sleep cycle, self-injury and agitation. The patient is not nervous/anxious and is not hyperactive.        Objective:   Physical Exam  Constitutional: She is oriented to person, place, and time. She appears well-developed and well-nourished. No distress.  HENT:  Head: Normocephalic and atraumatic.  Eyes: EOM are normal. Pupils are equal, round, and reactive to light.  Neck: Normal range of motion. Neck supple.  Cardiovascular: Normal rate and regular rhythm.   Pulmonary/Chest: Effort normal and breath sounds normal. No respiratory distress. She has no wheezes. She exhibits tenderness.  Abdominal: Soft. Bowel sounds are normal. She exhibits no distension. There is tenderness. There is no rebound and no guarding.  Musculoskeletal: Normal range of motion. She exhibits tenderness. She exhibits no edema.  Neurological: She is alert and oriented to person, place, and time. No cranial nerve deficit.  Skin: Skin is warm and dry. No rash noted. She is not diaphoretic. No erythema. No pallor.  Psychiatric: Her behavior is normal. Thought content normal.       Assessment/Plan:   1. Please see problem-oriented charting.  2. Disposition-patient be seen back in one month for close  followup. She was advised to go immediately to OB/GYN visit after this appointment for further evaluation of her menometrorrhagia. She was given  prescription for Celexa 20 mg daily to be taken for depression. She was refilled a two-week supply of Xanax and informed that this would be last prescription given for Xanax.

## 2012-02-24 NOTE — Patient Instructions (Signed)
You were seen today for your anxiety and we think this is coming from depression so we would like you to take a depression medicine called celexa (citalopram) 1 tab daily. We will give you two weeks of xanax and no more since the depression medicine will help more for you. Come back and see [place] for a check up in 1 month. If you have questions or problems please call our office at [phone].

## 2012-02-24 NOTE — Patient Instructions (Signed)
Patient instructed to take medications as defined in the Anti-coagulation Track section of this encounter.  Patient instructed to take today's dose.  Patient verbalized understanding of these instructions.    

## 2012-02-24 NOTE — Progress Notes (Signed)
Anti-Coagulation Progress Note  Megan Chandler is a 36 y.o. female who is currently on an anti-coagulation regimen.    RECENT RESULTS: Recent results are below, the most recent result is correlated with a dose of 37.5 mg. per week: Lab Results  Component Value Date   INR 2.80 02/24/2012   INR 2.8 02/21/2012   INR 3.46* 02/13/2012    ANTI-COAG DOSE:   Latest dosing instructions   Total Sun Mon Tue Wed Thu Fri Sat   37.5 5 mg 5 mg 5 mg 7.5 mg 5 mg 5 mg 5 mg    (5 mg1) (5 mg1) (5 mg1) (5 mg1.5) (5 mg1) (5 mg1) (5 mg1)         ANTICOAG SUMMARY: Anticoagulation Episode Summary              Current INR goal 2.0-3.0 Next INR check 03/16/2012   INR from last check 2.80 (02/24/2012)     Weekly max dose (mg)  Target end date Indefinite   Indications Pulmonary embolism with infarction, Encounter for long-term (current) use of anticoagulants (Resolved)   INR check location [facility] Preferred lab    Send INR reminders to    Comments             ANTICOAG TODAY: Anticoagulation Summary as of 02/24/2012              INR goal 2.0-3.0     Selected INR 2.80 (02/24/2012) Next INR check 03/16/2012   Weekly max dose (mg)  Target end date Indefinite   Indications Pulmonary embolism with infarction, Encounter for long-term (current) use of anticoagulants (Resolved)    Anticoagulation Episode Summary              INR check location [facility] Preferred lab    Send INR reminders to    Comments             PATIENT INSTRUCTIONS: Patient Instructions  Patient instructed to take medications as defined in the Anti-coagulation Track section of this encounter.  Patient instructed to take today's dose.  Patient verbalized understanding of these instructions.        FOLLOW-UP Return in 3 weeks (on 03/16/2012) for Follow up INR at 0945h.  Hulen Luster, III Pharm.D., CACP

## 2012-02-24 NOTE — Assessment & Plan Note (Signed)
Did discuss the patient does sound like her anxiety is actually depression. Will give her prescription for Celexa 20 mg daily and have her take this. Will reassess at followup visit in one month. Was given two-week supply of Xanax and informed that this will be the last prescription of Xanax given.

## 2012-02-26 ENCOUNTER — Telehealth: Payer: Self-pay | Admitting: *Deleted

## 2012-02-26 NOTE — Telephone Encounter (Signed)
What kind of health affect does she think this has had? She has not had any complaints about her breathing in last few visits.  Thanks,   Dr. Dorise Hiss

## 2012-02-26 NOTE — Telephone Encounter (Signed)
Pt calls and states she lives in a house that has tested positive for lead in the windows and doors framing materials also there is mold in the carpet, she states this has contributed to her asthma and increased respiratory problems recently and would like to break her lease and move, in order to do so she must have a letter addressed to the [employer] and [employer], stating that these problems in the house are causing her health to decline. If you can do this she will be called to pick up the letter.

## 2012-03-02 ENCOUNTER — Ambulatory Visit (INDEPENDENT_AMBULATORY_CARE_PROVIDER_SITE_OTHER): Payer: Medicaid Other | Admitting: Internal Medicine

## 2012-03-02 ENCOUNTER — Encounter: Payer: Self-pay | Admitting: Internal Medicine

## 2012-03-02 ENCOUNTER — Ambulatory Visit: Payer: Medicaid Other | Admitting: Internal Medicine

## 2012-03-02 VITALS — BP 127/81 | HR 100 | Temp 98.6°F | Ht 66.0 in | Wt 166.0 lb

## 2012-03-02 DIAGNOSIS — F419 Anxiety disorder, unspecified: Secondary | ICD-10-CM

## 2012-03-02 DIAGNOSIS — F411 Generalized anxiety disorder: Secondary | ICD-10-CM

## 2012-03-02 NOTE — Progress Notes (Signed)
Subjective:     Patient ID: Megan Chandler, female   DOB: [birth year], 36 y.o.   MRN: [id number]  HPI The patient comes in today for a letter stating that mold and lead in her house may have adverse effect to her health so that she can break her lease. She does have history of asthma and anxiety. It sounds as that she may be having a couple of anxiety attacks with increased heart rate slight chest discomfort and shakiness. At this time she uses albuterol which she said seemed to help. She states that she was in section 8 housing and that the vice president will come to her apartment this week to look at mold on the carpets. I did advise that if there is mold it would be reasonable to switch out the carpets however didn't feel like she needed to actually move for breathing concerns. Advised as long as she was not eating the pain chips off the walls that likely her lead levels are not affecting her health.  Review of Systems  Constitutional: Negative for fever, chills, diaphoresis, activity change, appetite change, fatigue and unexpected weight change.  HENT: Negative.   Eyes: Negative.   Respiratory: Negative for cough, chest tightness, shortness of breath and wheezing.        States occasional episodes that last about 5 minutes with chest discomfort, SOB, shakiness.  Cardiovascular: Negative for chest pain, palpitations and leg swelling.  Gastrointestinal: Negative.   Musculoskeletal: Negative.   Neurological: Negative.        Objective:   Physical Exam  Constitutional: She is oriented to person, place, and time. She appears well-developed and well-nourished. No distress.       Slightly strange affect.  HENT:  Head: Normocephalic and atraumatic.  Eyes: EOM are normal. Pupils are equal, round, and reactive to light.  Neck: Normal range of motion. Neck supple.  Cardiovascular: Normal rate and regular rhythm.   Pulmonary/Chest: Breath sounds normal. No respiratory distress. She has no  wheezes. She has no rales. She exhibits no tenderness.  Abdominal: Soft. Bowel sounds are normal. She exhibits no distension. There is no tenderness. There is no rebound.  Musculoskeletal: Normal range of motion. She exhibits no edema and no tenderness.  Neurological: She is alert and oriented to person, place, and time. No cranial nerve deficit.  Skin: Skin is warm and dry. She is not diaphoretic.  Psychiatric:       Slightly off judgement and perhaps sub-clinical decrease in IQ. Slightly anxious at times and looking for health problems.        Assessment/Plan:   1. Patient has still not started Celexa which was ordered at last visit. Advised her that taking this will help with her mood. Did give her a letter stating that she does have respiratory problems and that mold in her apartment could have potential to adversely effect her health. No other changes at today's visit. She will be seen back as previously scheduled at the end of September.  2. Disposition-the patient was given a letter as stated above. Will be seen back at the end of September with her PCP. No refills or medication changes at today's visit.

## 2012-03-02 NOTE — Patient Instructions (Signed)
We would like you to start taking the celexa daily and have given you a letter. Please come back as scheduled and call with questions or concerns. Our number is [phone].

## 2012-03-05 NOTE — Telephone Encounter (Signed)
Have not been able to reach by phone, x3

## 2012-03-10 ENCOUNTER — Telehealth: Payer: Self-pay | Admitting: *Deleted

## 2012-03-10 NOTE — Telephone Encounter (Signed)
Pt calls and states she would like a referral to a specialist about her blood clots. Please advise or send referral to your nurse. Thank you

## 2012-03-11 ENCOUNTER — Telehealth: Payer: Self-pay | Admitting: *Deleted

## 2012-03-11 NOTE — Telephone Encounter (Signed)
You can advise that there is no blood clot specialist and we are managing her blood clots at this time.   Dr. Dorise Hiss

## 2012-03-11 NOTE — Telephone Encounter (Signed)
Pt called and would like you to return her call at [phone]. She does not feel she needs to be on celexa and wants to talk with you.

## 2012-03-12 ENCOUNTER — Telehealth: Payer: Self-pay | Admitting: Internal Medicine

## 2012-03-12 NOTE — Telephone Encounter (Signed)
I called the pt back and she has NOT tried celexa and does not want to try it. She is having insomnia and can't sleep.  Also wants a referral to a pulmanologist because you told her you could not do the test to f/u on the PE she had. She is still having chest pain and  Trouble breathing. PLEASE call this pt about these problems.

## 2012-03-12 NOTE — Telephone Encounter (Signed)
Please disregard previous phone note response.   In Re:  Pt called and would like you to return her call at [phone].  She does not feel she needs to be on celexa and wants to talk with you.   Reviewed chart and she has never started taking celexa but she will not have xanax refill and should start taking celexa. This is by no means an acute issue as we have discussed it several times. She can wait until follow up visit in September for further discussion. And she was seen less than a week ago and did not bring this issue up at all.  Thanks, Dr. Dorise Hiss

## 2012-03-12 NOTE — Telephone Encounter (Signed)
She does need to be on celexa because I will not be refilling her xanax and this is working for her anxiety. Per her at last visit less than 1 week ago she is not having crying spells anymore. If she wants to stop taking it fine but no more xanax. She can discuss this with me at next visit. She was just seen less than 1 week ago and did not mention this then.

## 2012-03-13 NOTE — Telephone Encounter (Signed)
Sorry, I did not get your last message until this morning. Will call the patient today but she does not need a lung specialist at this time.

## 2012-03-16 ENCOUNTER — Other Ambulatory Visit: Payer: Self-pay | Admitting: Obstetrics & Gynecology

## 2012-03-16 ENCOUNTER — Ambulatory Visit (INDEPENDENT_AMBULATORY_CARE_PROVIDER_SITE_OTHER): Payer: Medicaid Other | Admitting: Pharmacist

## 2012-03-16 DIAGNOSIS — I359 Nonrheumatic aortic valve disorder, unspecified: Secondary | ICD-10-CM

## 2012-03-16 DIAGNOSIS — Z954 Presence of other heart-valve replacement: Secondary | ICD-10-CM

## 2012-03-16 DIAGNOSIS — I2699 Other pulmonary embolism without acute cor pulmonale: Secondary | ICD-10-CM

## 2012-03-16 DIAGNOSIS — I824Y9 Acute embolism and thrombosis of unspecified deep veins of unspecified proximal lower extremity: Secondary | ICD-10-CM

## 2012-03-16 DIAGNOSIS — D6859 Other primary thrombophilia: Secondary | ICD-10-CM

## 2012-03-16 DIAGNOSIS — I4891 Unspecified atrial fibrillation: Secondary | ICD-10-CM

## 2012-03-16 DIAGNOSIS — Z7901 Long term (current) use of anticoagulants: Secondary | ICD-10-CM

## 2012-03-16 DIAGNOSIS — I749 Embolism and thrombosis of unspecified artery: Secondary | ICD-10-CM

## 2012-03-16 DIAGNOSIS — I059 Rheumatic mitral valve disease, unspecified: Secondary | ICD-10-CM

## 2012-03-16 NOTE — Patient Instructions (Signed)
Patient instructed to take medications as defined in the Anti-coagulation Track section of this encounter.  Patient instructed to take today's dose.  Patient verbalized understanding of these instructions.    

## 2012-03-16 NOTE — Progress Notes (Signed)
Anti-Coagulation Progress Note  Megan Chandler is a 36 y.o. female who is currently on an anti-coagulation regimen.    RECENT RESULTS: Recent results are below, the most recent result is correlated with a dose of 37.5 mg. per week: Lab Results  Component Value Date   INR 3.3 03/16/2012   INR 2.80 02/24/2012   INR 2.8 02/21/2012    ANTI-COAG DOSE:   Latest dosing instructions   Total Sun Mon Tue Wed Thu Fri Sat   32.5 5 mg 5 mg 5 mg 2.5 mg 5 mg 5 mg 5 mg    (5 mg1) (5 mg1) (5 mg1) (5 mg0.5) (5 mg1) (5 mg1) (5 mg1)         ANTICOAG SUMMARY: Anticoagulation Episode Summary              Current INR goal 2.0-3.0 Next INR check 04/13/2012   INR from last check 3.3! (03/16/2012)     Weekly max dose (mg)  Target end date Indefinite   Indications Pulmonary embolism with infarction, Encounter for long-term (current) use of anticoagulants (Resolved)   INR check location [facility] Preferred lab    Send INR reminders to    Comments             ANTICOAG TODAY: Anticoagulation Summary as of 03/16/2012              INR goal 2.0-3.0     Selected INR 3.3! (03/16/2012) Next INR check 04/13/2012   Weekly max dose (mg)  Target end date Indefinite   Indications Pulmonary embolism with infarction, Encounter for long-term (current) use of anticoagulants (Resolved)    Anticoagulation Episode Summary              INR check location [facility] Preferred lab    Send INR reminders to    Comments             PATIENT INSTRUCTIONS: Patient Instructions  Patient instructed to take medications as defined in the Anti-coagulation Track section of this encounter.  Patient instructed to take today's dose.  Patient verbalized understanding of these instructions.        FOLLOW-UP Return in 4 weeks (on 04/13/2012) for Follow up INR at 1015h.  Hulen Luster, III Pharm.D., CACP

## 2012-03-17 ENCOUNTER — Encounter: Payer: Self-pay | Admitting: Internal Medicine

## 2012-03-25 ENCOUNTER — Encounter (HOSPITAL_COMMUNITY): Payer: Self-pay | Admitting: Pharmacist

## 2012-03-26 ENCOUNTER — Inpatient Hospital Stay (HOSPITAL_COMMUNITY): Admission: RE | Admit: 2012-03-26 | Payer: Medicaid Other | Source: Ambulatory Visit

## 2012-03-27 ENCOUNTER — Ambulatory Visit (INDEPENDENT_AMBULATORY_CARE_PROVIDER_SITE_OTHER): Payer: Medicaid Other | Admitting: Internal Medicine

## 2012-03-27 ENCOUNTER — Encounter: Payer: Self-pay | Admitting: Internal Medicine

## 2012-03-27 VITALS — BP 145/99 | HR 98 | Temp 97.8°F | Ht 66.0 in | Wt 157.8 lb

## 2012-03-27 DIAGNOSIS — F419 Anxiety disorder, unspecified: Secondary | ICD-10-CM

## 2012-03-27 DIAGNOSIS — D649 Anemia, unspecified: Secondary | ICD-10-CM

## 2012-03-27 DIAGNOSIS — J45909 Unspecified asthma, uncomplicated: Secondary | ICD-10-CM

## 2012-03-27 DIAGNOSIS — F411 Generalized anxiety disorder: Secondary | ICD-10-CM

## 2012-03-27 DIAGNOSIS — I2699 Other pulmonary embolism without acute cor pulmonale: Secondary | ICD-10-CM

## 2012-03-27 NOTE — Progress Notes (Signed)
Subjective:     Patient ID: Megan Chandler, female   DOB: February 23, 1976, 36 y.o.   MRN: [id number]  HPI The patient is a 36 year old female comes in today for a routine followup visit. She states her breathing is doing well. She said she's having a few problems sleeping at night. She states that sometimes she has problems falling asleep and sometimes she has problems with waking up in the middle of the night. She continues to have some vaginal bleeding and is going to be going back to OB/GYN for a repeat procedure on 04/02/2012. She continues to take her Coumadin. She continues to think there is mold at her apartment although she was told by someone that that is not mold. She is going to call the city to come out and look at the area.  Review of Systems  Constitutional: Negative.  Negative for fever, chills, activity change, appetite change, fatigue and unexpected weight change.  HENT: Negative.   Eyes: Negative.   Respiratory: Negative for cough, choking, chest tightness, shortness of breath and wheezing.   Cardiovascular: Positive for chest pain. Negative for palpitations and leg swelling.       Off-and-on chest pain.  Gastrointestinal: Negative for nausea, vomiting, abdominal pain, diarrhea, constipation and abdominal distention.  Musculoskeletal: Positive for back pain.  Skin: Negative for color change, pallor, rash and wound.  Neurological: Negative for dizziness, tremors, seizures, syncope, facial asymmetry, speech difficulty, weakness, light-headedness, numbness and headaches.  Hematological: Negative for adenopathy. Bruises/bleeds easily.  Psychiatric/Behavioral: Positive for decreased concentration. Negative for agitation. The patient is nervous/anxious and is hyperactive.        Objective:   Physical Exam  Constitutional: She is oriented to person, place, and time. She appears well-developed and well-nourished. No distress.  HENT:  Head: Normocephalic and atraumatic.  Eyes: EOM  are normal. Pupils are equal, round, and reactive to light.  Neck: Normal range of motion. Neck supple.  Cardiovascular: Normal rate and regular rhythm.   Pulmonary/Chest: Effort normal and breath sounds normal.  Abdominal: Soft. Bowel sounds are normal.  Musculoskeletal: Normal range of motion. She exhibits no edema and no tenderness.  Neurological: She is alert and oriented to person, place, and time. No cranial nerve deficit.  Skin: Skin is warm.  Psychiatric: She has a normal mood and affect.       Assessment/Plan:   1. Please see problem oriented charting  2. Disposition-patient be seen back in late November. At that time her Coumadin will stop on 05/25/2012. She is due for repeat gynecologic procedure on 04/02/2012. No changes made to her medications at today's visit. Pulmonology referral was made. Advised again that no Xanax refills would be given. Melatonin advised for helping to sleep at night.

## 2012-03-27 NOTE — Assessment & Plan Note (Signed)
Continues to take iron daily, follows with OB/GYN. Is having repeat endometrial ablation on 04/02/2012.

## 2012-03-27 NOTE — Assessment & Plan Note (Signed)
Patient continues to have some depression symptoms with mild anxiety component. She refuses to start SSRI. Have advised her we will not be prescribing Xanax that she's not willing to try therapy for the underlying cause.

## 2012-03-27 NOTE — Assessment & Plan Note (Signed)
Will stop Coumadin on 05/25/2012. This was first clot event with risk factors.

## 2012-03-27 NOTE — Assessment & Plan Note (Signed)
Continues to breathe well. Very well-controlled.

## 2012-03-27 NOTE — Patient Instructions (Signed)
For sleep we would like you to try taking melatonin which is over the counter at any drug store. Ask the pharmacist if you have questions. Take it after dinner to help with sleep at nighttime. We will also give you some tips and will send you to a lung doctor. We will see you back late November. If you have questions or problems please call us at [phone].

## 2012-03-30 ENCOUNTER — Encounter (HOSPITAL_COMMUNITY)
Admission: RE | Admit: 2012-03-30 | Discharge: 2012-03-30 | Disposition: A | Discharge status: Home / Self Care | Payer: Medicaid Other | Source: Ambulatory Visit | Attending: Obstetrics & Gynecology | Admitting: Obstetrics & Gynecology

## 2012-03-30 ENCOUNTER — Encounter (HOSPITAL_COMMUNITY): Payer: Self-pay

## 2012-03-30 HISTORY — DX: Gastro-esophageal reflux disease without esophagitis: K21.9

## 2012-03-30 HISTORY — DX: Anxiety disorder, unspecified: F41.9

## 2012-03-30 LAB — PROTIME-INR
INR: 3.13 — ABNORMAL HIGH (ref 0.00–1.49)
Prothrombin Time: 30.5 seconds — ABNORMAL HIGH (ref 11.6–15.2)

## 2012-03-30 LAB — CBC
Hemoglobin: 14.8 g/dL (ref 12.0–15.0)
MCHC: 32.2 g/dL (ref 30.0–36.0)
Platelets: 397 10*3/uL (ref 150–400)
RBC: 4.87 MIL/uL (ref 3.87–5.11)

## 2012-03-30 LAB — APTT: aPTT: 51 seconds — ABNORMAL HIGH (ref 24–37)

## 2012-03-30 NOTE — Pre-Procedure Instructions (Signed)
Dr. Sherron Ales notified of pt's coumadin use , and her plan to continue Coumadin throughout surgery per her doctor.

## 2012-03-30 NOTE — Patient Instructions (Signed)
Your procedure is scheduled on:04/02/12  Enter through the Main Entrance at :11:45 am Pick up desk phone and dial [phone] and inform us of your arrival.  Please call [phone] if you have any problems the morning of surgery.  Remember: Do not eat after midnight:Wed Do not drink after: 9am Thursday  Take these meds the morning of surgery with a sip of water:Pepcid, bring inhaler to hospital  DO NOT wear jewelry, eye make-up, lipstick,body lotion, or dark fingernail polish. Do not shave for 48 hours prior to surgery.  If you are to be admitted after surgery, leave suitcase in car until your room has been assigned. Patients discharged on the day of surgery will not be allowed to drive home.   Remember to use your Hibiclens as instructed.

## 2012-03-31 ENCOUNTER — Inpatient Hospital Stay (HOSPITAL_COMMUNITY)
Admission: AD | Admit: 2012-03-31 | Discharge: 2012-03-31 | Disposition: A | Discharge status: Home / Self Care | Payer: Medicaid Other | Source: Ambulatory Visit | Attending: Obstetrics | Admitting: Obstetrics

## 2012-03-31 ENCOUNTER — Encounter (HOSPITAL_COMMUNITY): Payer: Self-pay | Admitting: *Deleted

## 2012-03-31 DIAGNOSIS — N920 Excessive and frequent menstruation with regular cycle: Secondary | ICD-10-CM

## 2012-03-31 DIAGNOSIS — I2699 Other pulmonary embolism without acute cor pulmonale: Secondary | ICD-10-CM | POA: Insufficient documentation

## 2012-03-31 NOTE — Discharge Instructions (Signed)
Abnormal Uterine Bleeding Abnormal uterine bleeding can have many causes. Some cases are simply treated, while others are more serious. There are several kinds of bleeding that is considered abnormal, including:  Bleeding between periods.   Bleeding after sexual intercourse.   Spotting anytime in the menstrual cycle.   Bleeding heavier or more than normal.   Bleeding after menopause.  CAUSES  There are many causes of abnormal uterine bleeding. It can be present in teenagers, pregnant women, women during their reproductive years, and women who have reached menopause. Your caregiver will look for the more common causes depending on your age, signs, symptoms and your particular circumstance. Most cases are not serious and can be treated. Even the more serious causes, like cancer of the female organs, can be treated adequately if found in the early stages. That is why all types of bleeding should be evaluated and treated as soon as possible. DIAGNOSIS  Diagnosing the cause may take several kinds of tests. Your caregiver may:  Take a complete history of the type of bleeding.   Perform a complete physical exam and Pap smear.   Take an ultrasound on the abdomen showing a picture of the female organs and the pelvis.   Inject dye into the uterus and Fallopian tubes and X-ray them (hysterosalpingogram).   Place fluid in the uterus and do an ultrasound (sonohysterogrqphy).   Take a CT scan to examine the female organs and pelvis.   Take an MRI to examine the female organs and pelvis. There is no X-ray involved with this procedure.   Look inside the uterus with a telescope that has a light at the end (hysteroscopy).   Scrap the inside of the uterus to get tissue to examine (Dilatation and Curettage, D&C).   Look into the pelvis with a telescope that has a light at the end (laparoscopy). This is done through a very small cut (incision) in the abdomen.  TREATMENT  Treatment will depend on the  cause of the abnormal bleeding. It can include:  Doing nothing to allow the problem to take care of itself over time.   Hormone treatment.   Birth control pills.   Treating the medical condition causing the problem.   Laparoscopy.   Major or minor surgery   Destroying the lining of the uterus with electrical currant, laser, freezing or heat (uterine ablation).  HOME CARE INSTRUCTIONS   Follow your caregiver's recommendation on how to treat your problem.   See your caregiver if you missed a menstrual period and think you may be pregnant.   If you are bleeding heavily, count the number of pads/tampons you use and how often you have to change them. Tell this to your caregiver.   Avoid sexual intercourse until the problem is controlled.  SEEK MEDICAL CARE IF:   You have any kind of abnormal bleeding mentioned above.   You feel dizzy at times.   You are 36 years old and have not had a menstrual period yet.  SEEK IMMEDIATE MEDICAL CARE IF:   You pass out.   You are changing pads/tampons every 15 to 30 minutes.   You have belly (abdominal) pain.   You have a temperature of 100 F (37.8 C) or higher.   You become sweaty or weak.   You are passing large blood clots from the vagina.   You start to feel sick to your stomach (nauseous) and throw up (vomit).  Document Released: 06/24/2005 Document Revised: 06/13/2011 Document Reviewed: 11/17/2008 [employer]   Patient Information 2012 [employer]. 

## 2012-03-31 NOTE — MAU Provider Note (Signed)
History     CSN: [id number]  Arrival date and time: 03/31/12 4098   First Provider Initiated Contact with Patient 03/31/12 1942      Chief Complaint  Patient presents with  . Vaginal Bleeding   HPI This is a 36 y.o. female who presents via EMS with c/o vaginal bleeding which became heavier at 1700hrs. Did not call Dr Tamela Oddi. Has been on Coumadin for pulmonary embolism. Had labs yesterday and Hgb was over 14.  Wants something to stop the bleeding, though is already on a high dose of Megace.  A second ablation is scheduled for Thursday, 9/26.  Has Rx for Percocet but has not been taking it because she does not like the way it makes her feel.  OB History    Grav Para Term Preterm Abortions TAB SAB Ect Mult Living   2 1 1  0 1 0 1 0 0 1      Past Medical History  Diagnosis Date  . Urinary tract infection   . Chronic low back pain     buldging disc  . Endometriosis   . Bronchitis   . Headache     often but no migraines  . Arthritis   . History of bladder infections   . Bartholin's cyst     hx of  . Pulmonary embolism     11/2011  . Menometrorrhagia   . Adenomyosis   . Hypertension     but not on medication  . Asthma     daily inhaler use  . Anxiety   . Shortness of breath     due to asthma  . GERD (gastroesophageal reflux disease)   . Anemia   . History of blood transfusion June 2013    Past Surgical History  Procedure Date  . Dilation and curettage of uterus   . Cholecystectomy, laparoscopic 1998  . Umbilical hernia repair     as a child  . Cesarean section 1996  . Laparoscopy at age 44  . Cervical spine surgery 2009  . Back surgery     07/2011  . Lumbar spine surgery   . Laparoscopic tubal ligation 02/12/2012    Procedure: LAPAROSCOPIC TUBAL LIGATION;  Surgeon: Antionette Char, MD;  Location: [facility];  Service: Gynecology;  Laterality: Bilateral;  . Tubal ligation     Family History  Problem Relation Age of Onset  . Hypertension Mother   .  Cancer Maternal Aunt   . Cancer Maternal Uncle   . Diabetes Maternal Grandmother   . Anesthesia problems Neg Hx   . Hypotension Neg Hx   . Malignant hyperthermia Neg Hx   . Pseudochol deficiency Neg Hx   . Stroke Other     aunt  . Sickle cell anemia Other     History  Substance Use Topics  . Smoking status: Former Smoker    Types: Cigars    Quit date: 09/28/2011  . Smokeless tobacco: Never Used  . Alcohol Use: No    Allergies:  Allergies  Allergen Reactions  . Morphine And Related Anaphylaxis    Pt can take dilaudid  . Aspirin Itching    Pt can tolerate ibuprofen  . Contrast Media (Iodinated Diagnostic Agents) Nausea And Vomiting  . Penicillins Itching  . Vicodin (Hydrocodone-Acetaminophen) Itching    Pt can take percocet    Prescriptions prior to admission  Medication Sig Dispense Refill  . albuterol (PROVENTIL HFA;VENTOLIN HFA) 108 (90 BASE) MCG/ACT inhaler Inhale 2 puffs into the lungs  every 4 (four) hours as needed. Takes for shortness of breath      . budesonide-formoterol (SYMBICORT) 80-4.5 MCG/ACT inhaler Inhale 2 puffs into the lungs 2 (two) times daily.  1 Inhaler  5  . cyclobenzaprine (FLEXERIL) 5 MG tablet Take 5 mg by mouth 2 (two) times daily as needed. For muscle spasms      . famotidine (PEPCID) 20 MG tablet Take 1 tablet (20 mg total) by mouth 2 (two) times daily.  60 tablet  3  . ferrous sulfate (FERROUSUL) 325 (65 FE) MG tablet Take 1 tablet (325 mg total) by mouth daily with breakfast.  30 tablet  11  . gabapentin (NEURONTIN) 100 MG capsule Take 100 mg by mouth 3 (three) times daily.      . megestrol (MEGACE) 40 MG tablet Take 120 mg by mouth 2 (two) times daily.       . Nafarelin Acetate (SYNAREL) 2 MG/ML SOLN Place 4 mLs into the nose 2 (two) times daily.       Marland Kitchen warfarin (COUMADIN) 5 MG tablet Take 2.5-5 mg by mouth daily. Pt takes 5 mg (1 tab) daily except 2.5 mg (0.5 tab) on Wednesday's        ROS As in HPI  Physical Exam   Blood pressure  120/87, pulse 115, temperature 98.4 F (36.9 C), temperature source Oral, resp. rate 20, height 5\' 6"  (1.676 m), weight 180 lb (81.647 kg), last menstrual period 03/30/2012, SpO2 100.00%.  Physical Exam  Constitutional: She is oriented to person, place, and time. She appears well-developed and well-nourished. No distress.  Cardiovascular: Normal rate.   Respiratory: Effort normal.  GI: Soft. She exhibits no distension and no mass. There is tenderness (over pelvis). There is no rebound and no guarding.  Genitourinary: Uterus normal. Vaginal discharge (moderate clotted blood in vault, no flow of blood) found.  Musculoskeletal: Normal range of motion.  Neurological: She is alert and oriented to person, place, and time.  Skin: Skin is warm and dry.  Psychiatric: She has a normal mood and affect.   Results for orders placed during the hospital encounter of 03/30/12 (from the past 72 hour(s))  PROTIME-INR     Status: Abnormal   Collection Time   03/30/12 11:50 AM      Component Value Range Comment   Prothrombin Time 30.5 (*) 11.6 - 15.2 seconds COUMADIN THERAPY   INR 3.13 (*) 0.00 - 1.49   APTT     Status: Abnormal   Collection Time   03/30/12 11:50 AM      Component Value Range Comment   aPTT 51 (*) 24 - 37 seconds   CBC     Status: Abnormal   Collection Time   03/30/12 11:51 AM      Component Value Range Comment   WBC 13.2 (*) 4.0 - 10.5 K/uL    RBC 4.87  3.87 - 5.11 MIL/uL    Hemoglobin 14.8  12.0 - 15.0 g/dL    HCT 16.1  09.6 - 04.5 %    MCV 94.3  78.0 - 100.0 fL    MCH 30.4  26.0 - 34.0 pg    MCHC 32.2  30.0 - 36.0 g/dL    RDW 40.9 (*) 81.1 - 15.5 %    Platelets 397  150 - 400 K/uL    MAU Course  Procedures   Assessment and Plan  A:  Menorrhagia      Coumadin therapy for pulmonary embolism      Excellent  Hemoglobin level  P:  Discussed with Dr Clearance Coots      He recommends she go home, continue her meds, and call Dr Tamela Oddi in the morning.          [facility] 03/31/2012, 7:52 PM

## 2012-03-31 NOTE — MAU Note (Signed)
Pt states she had an ablation on Aug 8th for endometrosis by Dr Wilburn Mylar then she has had vaginal bleeding but today was much worse about 1700-she has a history of a PE diagnosed in May and is on Coumadin

## 2012-04-01 NOTE — H&P (Signed)
  Chief Complaint: 36 y.o.  who presents for a D&C/hydrothermal endometrial ablation  Details of Present Illness: The patient is currently anticoagulated on Coumadin for recent pulmonary embolism in 5/13.  She has a long history of abnormal uterine bleeding.  She is s/p a Novasure ablation with persistent bleeding.  She declined a Mirena IUD.  There were no vitals taken for this visit.  Past Medical History  Diagnosis Date  . Urinary tract infection   . Chronic low back pain     buldging disc  . Endometriosis   . Bronchitis   . Headache     often but no migraines  . Arthritis   . History of bladder infections   . Bartholin's cyst     hx of  . Pulmonary embolism     11/2011  . Menometrorrhagia   . Adenomyosis   . Hypertension     but not on medication  . Asthma     daily inhaler use  . Anxiety   . Shortness of breath     due to asthma  . GERD (gastroesophageal reflux disease)   . Anemia   . History of blood transfusion June 2013   History   Social History  . Marital Status: Single    Spouse Name: N/A    Number of Children: N/A  . Years of Education: N/A   Occupational History  . Not on file.   Social History Main Topics  . Smoking status: Former Smoker    Types: Cigars    Quit date: 09/28/2011  . Smokeless tobacco: Never Used  . Alcohol Use: No  . Drug Use: No  . Sexually Active: Not Currently    Birth Control/ Protection: None     Nuvaring   Other Topics Concern  . Not on file   Social History Narrative  . No narrative on file   Family History  Problem Relation Age of Onset  . Hypertension Mother   . Cancer Maternal Aunt   . Cancer Maternal Uncle   . Diabetes Maternal Grandmother   . Anesthesia problems Neg Hx   . Hypotension Neg Hx   . Malignant hyperthermia Neg Hx   . Pseudochol deficiency Neg Hx   . Stroke Other     aunt  . Sickle cell anemia Other     Pertinent items are noted in HPI.  Pre-Op Diagnosis: AUB   Planned Procedure:  Procedure(s): DILATATION & CURETTAGE/HYSTEROSCOPY WITH HYDROTHERMAL ABLATION  I have reviewed the patient's history and have completed the physical exam and Megan Chandler is acceptable for surgery.  [name], MD 04/01/2012 9:19 PM

## 2012-04-02 ENCOUNTER — Ambulatory Visit (HOSPITAL_COMMUNITY): Payer: Medicaid Other | Admitting: Anesthesiology

## 2012-04-02 ENCOUNTER — Ambulatory Visit (HOSPITAL_COMMUNITY)
Admission: RE | Admit: 2012-04-02 | Discharge: 2012-04-03 | Disposition: A | Discharge status: Home / Self Care | Payer: Medicaid Other | Source: Ambulatory Visit | Attending: Obstetrics & Gynecology | Admitting: Obstetrics & Gynecology

## 2012-04-02 ENCOUNTER — Encounter (HOSPITAL_COMMUNITY)
Admission: RE | Disposition: A | Discharge status: Home / Self Care | Payer: Self-pay | Source: Ambulatory Visit | Attending: Obstetrics & Gynecology

## 2012-04-02 ENCOUNTER — Encounter (HOSPITAL_COMMUNITY): Payer: Self-pay | Admitting: Anesthesiology

## 2012-04-02 ENCOUNTER — Encounter (HOSPITAL_COMMUNITY): Payer: Self-pay

## 2012-04-02 DIAGNOSIS — N921 Excessive and frequent menstruation with irregular cycle: Secondary | ICD-10-CM | POA: Diagnosis present

## 2012-04-02 DIAGNOSIS — Z01812 Encounter for preprocedural laboratory examination: Secondary | ICD-10-CM | POA: Insufficient documentation

## 2012-04-02 DIAGNOSIS — D649 Anemia, unspecified: Secondary | ICD-10-CM

## 2012-04-02 DIAGNOSIS — N938 Other specified abnormal uterine and vaginal bleeding: Secondary | ICD-10-CM | POA: Insufficient documentation

## 2012-04-02 DIAGNOSIS — K219 Gastro-esophageal reflux disease without esophagitis: Secondary | ICD-10-CM

## 2012-04-02 DIAGNOSIS — Z01818 Encounter for other preprocedural examination: Secondary | ICD-10-CM | POA: Insufficient documentation

## 2012-04-02 DIAGNOSIS — R0602 Shortness of breath: Secondary | ICD-10-CM

## 2012-04-02 DIAGNOSIS — N949 Unspecified condition associated with female genital organs and menstrual cycle: Secondary | ICD-10-CM | POA: Insufficient documentation

## 2012-04-02 HISTORY — PX: HYSTEROSCOPY: SHX211

## 2012-04-02 LAB — PROTIME-INR: INR: 2.09 — ABNORMAL HIGH (ref 0.00–1.49)

## 2012-04-02 SURGERY — HYSTEROSCOPY
Anesthesia: General | Site: Uterus | Wound class: Clean Contaminated

## 2012-04-02 MED ORDER — ACETAMINOPHEN 325 MG PO TABS: 650.0000 mg | ORAL_TABLET | ORAL | Status: DC | PRN

## 2012-04-02 MED ORDER — ONDANSETRON HCL 4 MG/2ML IJ SOLN
INTRAMUSCULAR | Status: DC | PRN
  Administered 2012-04-02: 4 mg via INTRAVENOUS

## 2012-04-02 MED ORDER — ACETAMINOPHEN 650 MG RE SUPP
650.0000 mg | RECTAL | Status: DC | PRN
  Filled 2012-04-02: qty 1

## 2012-04-02 MED ORDER — FENTANYL CITRATE 0.05 MG/ML IJ SOLN
INTRAMUSCULAR | Status: AC
  Administered 2012-04-02: 50 ug via INTRAVENOUS
  Filled 2012-04-02: qty 2

## 2012-04-02 MED ORDER — PANTOPRAZOLE SODIUM 40 MG PO TBEC
DELAYED_RELEASE_TABLET | ORAL | Status: AC
  Administered 2012-04-02: 40 mg via ORAL
  Filled 2012-04-02: qty 1

## 2012-04-02 MED ORDER — FAMOTIDINE 20 MG PO TABS
20.0000 mg | ORAL_TABLET | Freq: Two times a day (BID) | ORAL | Status: DC
  Administered 2012-04-02 – 2012-04-03 (×2): 20 mg via ORAL
  Filled 2012-04-02 (×2): qty 1

## 2012-04-02 MED ORDER — MIDAZOLAM HCL 5 MG/5ML IJ SOLN
INTRAMUSCULAR | Status: DC | PRN
  Administered 2012-04-02: 2 mg via INTRAVENOUS

## 2012-04-02 MED ORDER — OXYCODONE-ACETAMINOPHEN 5-325 MG PO TABS
1.0000 | ORAL_TABLET | ORAL | Status: DC | PRN
  Administered 2012-04-02: 2 via ORAL
  Filled 2012-04-02: qty 2

## 2012-04-02 MED ORDER — SODIUM CHLORIDE 0.9 % IV SOLN: 250.0000 mL | INTRAVENOUS | Status: DC | PRN

## 2012-04-02 MED ORDER — LIDOCAINE HCL (CARDIAC) 20 MG/ML IV SOLN
INTRAVENOUS | Status: AC
  Filled 2012-04-02: qty 5

## 2012-04-02 MED ORDER — ONDANSETRON HCL 4 MG/2ML IJ SOLN: 4.0000 mg | Freq: Four times a day (QID) | INTRAMUSCULAR | Status: DC | PRN

## 2012-04-02 MED ORDER — FENTANYL CITRATE 0.05 MG/ML IJ SOLN
INTRAMUSCULAR | Status: DC | PRN
  Administered 2012-04-02 (×2): 50 ug via INTRAVENOUS

## 2012-04-02 MED ORDER — PROPOFOL 10 MG/ML IV EMUL
INTRAVENOUS | Status: AC
  Filled 2012-04-02: qty 20

## 2012-04-02 MED ORDER — GABAPENTIN 100 MG PO CAPS
100.0000 mg | ORAL_CAPSULE | Freq: Three times a day (TID) | ORAL | Status: DC
  Administered 2012-04-02 – 2012-04-03 (×2): 100 mg via ORAL
  Filled 2012-04-02 (×2): qty 1

## 2012-04-02 MED ORDER — KETOROLAC TROMETHAMINE 30 MG/ML IJ SOLN
INTRAMUSCULAR | Status: AC
  Filled 2012-04-02: qty 1

## 2012-04-02 MED ORDER — SODIUM CHLORIDE 0.9 % IJ SOLN: 3.0000 mL | Freq: Two times a day (BID) | INTRAMUSCULAR | Status: DC

## 2012-04-02 MED ORDER — WARFARIN SODIUM 5 MG PO TABS
5.0000 mg | ORAL_TABLET | ORAL | Status: DC
  Administered 2012-04-02: 5 mg via ORAL
  Filled 2012-04-02: qty 1

## 2012-04-02 MED ORDER — SODIUM CHLORIDE 0.9 % IJ SOLN: 3.0000 mL | INTRAMUSCULAR | Status: DC | PRN

## 2012-04-02 MED ORDER — ONDANSETRON HCL 4 MG/2ML IJ SOLN
INTRAMUSCULAR | Status: AC
  Filled 2012-04-02: qty 2

## 2012-04-02 MED ORDER — LIDOCAINE HCL 2 % IJ SOLN
INTRAMUSCULAR | Status: AC
  Filled 2012-04-02: qty 20

## 2012-04-02 MED ORDER — MIDAZOLAM HCL 2 MG/2ML IJ SOLN
INTRAMUSCULAR | Status: AC
  Filled 2012-04-02: qty 2

## 2012-04-02 MED ORDER — WARFARIN SODIUM 2.5 MG PO TABS: 2.5000 mg | ORAL_TABLET | ORAL | Status: DC

## 2012-04-02 MED ORDER — PANTOPRAZOLE SODIUM 40 MG PO TBEC
40.0000 mg | DELAYED_RELEASE_TABLET | Freq: Once | ORAL | Status: AC
  Administered 2012-04-02: 40 mg via ORAL

## 2012-04-02 MED ORDER — OXYCODONE HCL 5 MG PO TABS
5.0000 mg | ORAL_TABLET | ORAL | Status: DC | PRN
  Filled 2012-04-02: qty 2

## 2012-04-02 MED ORDER — FENTANYL CITRATE 0.05 MG/ML IJ SOLN
25.0000 ug | INTRAMUSCULAR | Status: DC | PRN
  Administered 2012-04-02 (×2): 50 ug via INTRAVENOUS

## 2012-04-02 MED ORDER — LACTATED RINGERS IV SOLN
INTRAVENOUS | Status: DC
  Administered 2012-04-02 (×2): 50 mL/h via INTRAVENOUS
  Administered 2012-04-02: 13:00:00 via INTRAVENOUS

## 2012-04-02 MED ORDER — SODIUM CHLORIDE 0.9 % IR SOLN
Status: DC | PRN
  Administered 2012-04-02 (×2): 3000 mL

## 2012-04-02 MED ORDER — LIDOCAINE HCL 2 % IJ SOLN
INTRAMUSCULAR | Status: DC | PRN
  Administered 2012-04-02: 20 mL

## 2012-04-02 MED ORDER — NAFARELIN ACETATE 2 MG/ML NA SOLN: 2.0000 mL | Freq: Two times a day (BID) | NASAL | Status: DC

## 2012-04-02 MED ORDER — OXYCODONE-ACETAMINOPHEN 5-325 MG PO TABS: 2.0000 | ORAL_TABLET | Freq: Four times a day (QID) | ORAL | Status: DC | PRN

## 2012-04-02 MED ORDER — PROPOFOL 10 MG/ML IV EMUL
INTRAVENOUS | Status: DC | PRN
  Administered 2012-04-02: 170 mg via INTRAVENOUS

## 2012-04-02 MED ORDER — ALBUTEROL SULFATE HFA 108 (90 BASE) MCG/ACT IN AERS: 2.0000 | INHALATION_SPRAY | RESPIRATORY_TRACT | Status: DC | PRN

## 2012-04-02 MED ORDER — FENTANYL CITRATE 0.05 MG/ML IJ SOLN
INTRAMUSCULAR | Status: AC
  Filled 2012-04-02: qty 2

## 2012-04-02 MED ORDER — MEGESTROL ACETATE 40 MG PO TABS
120.0000 mg | ORAL_TABLET | Freq: Two times a day (BID) | ORAL | Status: DC
  Administered 2012-04-02 – 2012-04-03 (×2): 120 mg via ORAL
  Filled 2012-04-02 (×2): qty 3

## 2012-04-02 MED ORDER — WARFARIN - PHYSICIAN DOSING INPATIENT: Freq: Every day | Status: DC

## 2012-04-02 MED ORDER — BUDESONIDE-FORMOTEROL FUMARATE 80-4.5 MCG/ACT IN AERO
2.0000 | INHALATION_SPRAY | Freq: Two times a day (BID) | RESPIRATORY_TRACT | Status: DC
  Administered 2012-04-02: 2 via RESPIRATORY_TRACT

## 2012-04-02 MED ORDER — LIDOCAINE HCL (CARDIAC) 20 MG/ML IV SOLN
INTRAVENOUS | Status: DC | PRN
  Administered 2012-04-02 (×2): 30 mg via INTRAVENOUS

## 2012-04-02 SURGICAL SUPPLY — 20 items
CATH ROBINSON RED A/P 16FR (CATHETERS) IMPLANT
CLOTH BEACON ORANGE TIMEOUT ST (SAFETY) ×3 IMPLANT
CONTAINER PREFILL 10% NBF 60ML (FORM) ×6 IMPLANT
DRAPE HYSTEROSCOPY (DRAPE) ×3 IMPLANT
DRESSING TELFA 8X3 (GAUZE/BANDAGES/DRESSINGS) ×3 IMPLANT
ELECT REM PT RETURN 9FT ADLT (ELECTROSURGICAL)
ELECTRODE REM PT RTRN 9FT ADLT (ELECTROSURGICAL) IMPLANT
GLOVE BIO SURGEON STRL SZ 6.5 (GLOVE) ×6 IMPLANT
GOWN STRL REIN XL XLG (GOWN DISPOSABLE) ×9 IMPLANT
NDL SPNL 22GX3.5 QUINCKE BK (NEEDLE) ×1 IMPLANT
NEEDLE SPNL 22GX3.5 QUINCKE BK (NEEDLE) ×3 IMPLANT
NS IRRIG 1000ML POUR BTL (IV SOLUTION) ×3 IMPLANT
PACK HYSTEROSCOPY LF (CUSTOM PROCEDURE TRAY) ×2 IMPLANT
PACK VAGINAL MINOR WOMEN LF (CUSTOM PROCEDURE TRAY) ×1 IMPLANT
PAD OB MATERNITY 4.3X12.25 (PERSONAL CARE ITEMS) ×3 IMPLANT
SET GENESYS HTA PROCERVA (MISCELLANEOUS) ×2 IMPLANT
SUT VICRYL 0 ENDOLOOP (SUTURE) ×4 IMPLANT
SYR CONTROL 10ML LL (SYRINGE) ×3 IMPLANT
TOWEL OR 17X24 6PK STRL BLUE (TOWEL DISPOSABLE) ×6 IMPLANT
WATER STERILE IRR 1000ML POUR (IV SOLUTION) ×3 IMPLANT

## 2012-04-02 NOTE — Op Note (Signed)
Preoperative diagnosis: AUB-C  Postoperative diagnosis: same  Procedure: Diagnostic hysteroscopy, dilatation and curettage, attempted hydrothermal ablation  Surgeon: Antionette Char A  Anesthesia: Laryngeal mask airway, paracervical block  Estimated blood loss: minimal  Urine output: 200 ml  IV Fluids: per Anesthesiology  Complications: none  Specimen: PATHOLOGY  Operative Findings:  At hysteroscopy, there was blood in endometrial cavity.  Visualization was sub optimal.  Description of procedure:   The patient was taken to the operating room and placed on the operating table in the semi-lithotomy position in Cameron stirrups.  Examination under anesthesia was performed.  The patient was prepped and draped in the usual manner.  After a time-out had been completed, a speculum was placed in the vagina.  The anterior lip of the cervix was grasped with a single-toothed tenaculum.    10 cc of 1% lidocaine were injected at 4 and 7 o'clock to produce a paracervical block. The endocervical canal was dilated with Shawnie Pons dilators.  The HTA device was introduced into the uterine cavity to perform a diagnostic hysteroscopy.  The findings are noted above.    The hysteroscope was removed.  A small, Sims curette was used to perform an endometrial curettage.  The HTA deviced was reintroduced into the lower uterine segment.  Numerous attempts were made to reduce the excessive loss of fluid during the uterine integrity testing. An endoloop was placed around the cervix.  Single-tooth tenaculae were placed at 12 and 6 o'clock.  Further attempts to proceed with the ablation were aborted.  The endoloop was removed.  All the instruments were removed from the vagina.  Final instrument counts were correct.  The patient was taken to the PACU in stable condition.

## 2012-04-02 NOTE — Anesthesia Postprocedure Evaluation (Signed)
Patient has no ride and no one to stay with her tonight.  She had planned to use [facility] and then stay alone.  She had general anesthesia for D&C/hysteroscopy today and given her complex medical history we do not feel she should go home alone tonight.  Jasmine December, MD

## 2012-04-02 NOTE — Interval H&P Note (Signed)
History and Physical Interval Note:  04/02/2012 12:05 PM  Megan Chandler  has presented today for surgery, with the diagnosis of AUB  The various methods of treatment have been discussed with the patient and family. After consideration of risks, benefits and other options for treatment, the patient has consented to  Procedure(s) (LRB) with comments: DILATATION & CURETTAGE/HYSTEROSCOPY WITH HYDROTHERMAL ABLATION (N/A) as a surgical intervention .  The patient's history has been reviewed, patient examined, no change in status, stable for surgery.  I have reviewed the patient's chart and labs.  Questions were answered to the patient's satisfaction.     JACKSON-MOORE,Zuhayr Deeney A

## 2012-04-02 NOTE — Anesthesia Postprocedure Evaluation (Signed)
  Anesthesia Post-op Note  Patient: Megan Chandler  Procedure(s) Performed: Procedure(s) (LRB) with comments: HYSTEROSCOPY (N/A) - with Dilitation and curretage and attempted hydrothermal ablation  Patient is awake and responsive. Pain and nausea are reasonably well controlled. Vital signs are stable and clinically acceptable. Oxygen saturation is clinically acceptable. There are no apparent anesthetic complications at this time. Patient is ready for discharge.

## 2012-04-02 NOTE — Addendum Note (Signed)
Addendum  created 04/02/12 1617 by Dana Allan, MD   Modules edited:Notes Section

## 2012-04-02 NOTE — Transfer of Care (Signed)
Immediate Anesthesia Transfer of Care Note  Patient: Megan Chandler  Procedure(s) Performed: Procedure(s) (LRB) with comments: HYSTEROSCOPY (N/A) - with Dilitation and curretage and attempted hydrothermal ablation  Patient Location: PACU  Anesthesia Type: General  Level of Consciousness: awake, alert  and oriented  Airway & Oxygen Therapy: Patient Spontanous Breathing and Patient connected to nasal cannula oxygen  Post-op Assessment: Report given to PACU RN and Post -op Vital signs reviewed and stable  Post vital signs: Reviewed and stable  Complications: No apparent anesthesia complications

## 2012-04-02 NOTE — Anesthesia Preprocedure Evaluation (Addendum)
Anesthesia Evaluation  Patient identified by MRN, date of birth, ID band Patient awake    Reviewed: Allergy & Precautions, H&P , Patient's Chart, lab work & pertinent test results, reviewed documented beta blocker date and time   Airway Mallampati: II TM Distance: >3 FB Neck ROM: full    Dental No notable dental hx.    Pulmonary asthma (Uses inhaler every dat; 3x. No steroids recently. chest clear) , PE (currently anticoagulated for PE from May 15th) breath sounds clear to auscultation  Pulmonary exam normal       Cardiovascular Rhythm:regular Rate:Normal     Neuro/Psych    GI/Hepatic   Endo/Other    Renal/GU      Musculoskeletal   Abdominal   Peds  Hematology   Anesthesia Other Findings   Reproductive/Obstetrics                         Anesthesia Physical Anesthesia Plan  ASA: II  Anesthesia Plan: General   Post-op Pain Management:    Induction: Intravenous  Airway Management Planned: LMA  Additional Equipment:   Intra-op Plan:   Post-operative Plan:   Informed Consent: I have reviewed the patients History and Physical, chart, labs and discussed the procedure including the risks, benefits and alternatives for the proposed anesthesia with the patient or authorized representative who has indicated his/her understanding and acceptance.   Dental Advisory Given  Plan Discussed with: CRNA and Surgeon  Anesthesia Plan Comments: (  Discussed  general anesthesia, including possible nausea, instrumentation of airway, sore throat,pulmonary aspiration, etc. I asked if the were any outstanding questions, or  concerns before we proceeded. )        Anesthesia Quick Evaluation

## 2012-04-03 ENCOUNTER — Encounter (HOSPITAL_COMMUNITY): Payer: Self-pay | Admitting: Obstetrics and Gynecology

## 2012-04-03 LAB — PROTIME-INR: Prothrombin Time: 23.1 seconds — ABNORMAL HIGH (ref 11.6–15.2)

## 2012-04-03 MED ORDER — OXYCODONE-ACETAMINOPHEN 5-325 MG PO TABS
1.0000 | ORAL_TABLET | ORAL | Status: DC | PRN
  Administered 2012-04-03 (×2): 2 via ORAL
  Filled 2012-04-03 (×2): qty 2

## 2012-04-03 NOTE — Discharge Summary (Signed)
Physician Discharge Summary  Patient ID: Megan Chandler MRN: [id number] DOB/AGE: 1976-04-04 36 y.o.  Admit date: 04/02/2012 Discharge date: 04/03/2012  Admission Diagnoses: <principal problem not specified>  Discharge Diagnoses:  Active Problems:  Menometrorrhagia   Discharged Condition: stable  Hospital Course: On 04/02/2012, the patient underwent the following: Procedure(s): HYSTEROSCOPY.   The postoperative course was uneventful.  She was discharged to home on postoperative day 1 tolerating a regular diet.  Consults: None  Significant Diagnostic Studies: see above  Treatments: surgery: see above  Discharge Exam: Blood pressure 116/80, pulse 102, temperature 98.3 F (36.8 C), temperature source Oral, resp. rate 18, height 5\' 6"  (1.676 m), weight 81.647 kg (180 lb), SpO2 98.00%. General appearance: alert GI: soft, non-tender; bowel sounds normal; no masses,  no organomegaly Extremities: extremities normal, atraumatic, no cyanosis or edema  Disposition: 01-Home or Self Care  Discharge Orders    Future Appointments: Provider: Department: Dept Phone: Center:   04/13/2012 10:15 AM [facility] [phone] [facility]     Future Orders Please Complete By Expires   Diet - low sodium heart healthy      Activity as tolerated - No restrictions      May shower / Bathe      Comments:   No tub baths for 4 weeks   Driving Restrictions      Comments:   No driving while taking narcotics.   Sexual Activity Restrictions      Comments:   No intercourse for 6  weeks   Call MD for:  temperature >100.4      Call MD for:  persistant nausea and vomiting      Call MD for:  severe uncontrolled pain      Call MD for:  persistant dizziness or light-headedness      Call MD for:  extreme fatigue          Medication List     As of 04/03/2012 11:05 AM    TAKE these medications         albuterol 108 (90 BASE) MCG/ACT inhaler   Commonly known as: PROVENTIL  HFA;VENTOLIN HFA   Inhale 2 puffs into the lungs every 4 (four) hours as needed. Takes for shortness of breath      budesonide-formoterol 80-4.5 MCG/ACT inhaler   Commonly known as: SYMBICORT   Inhale 2 puffs into the lungs 2 (two) times daily.      cyclobenzaprine 5 MG tablet   Commonly known as: FLEXERIL   Take 5 mg by mouth 2 (two) times daily as needed. For muscle spasms      famotidine 20 MG tablet   Commonly known as: PEPCID   Take 1 tablet (20 mg total) by mouth 2 (two) times daily.      ferrous sulfate 325 (65 FE) MG tablet   Take 1 tablet (325 mg total) by mouth daily with breakfast.      gabapentin 100 MG capsule   Commonly known as: NEURONTIN   Take 100 mg by mouth 3 (three) times daily.      megestrol 40 MG tablet   Commonly known as: MEGACE   Take 120 mg by mouth 2 (two) times daily.      oxyCODONE-acetaminophen 5-325 MG per tablet   Commonly known as: PERCOCET/ROXICET   Take 2 tablets by mouth every 6 (six) hours as needed for pain.      SYNAREL 2 MG/ML Soln   Generic drug: Nafarelin Acetate  Place 4 mLs into the nose 2 (two) times daily.      warfarin 5 MG tablet   Commonly known as: COUMADIN   Take 2.5-5 mg by mouth daily. Pt takes 5 mg (1 tab) daily except 2.5 mg (0.5 tab) on Wednesday's           Follow-up Information    Follow up with Antionette Char A, MD. Schedule an appointment as soon as possible for a visit in 2 weeks.   Contact information:   [address] [place] [address] [phone]          Signed: [name] 04/03/2012, 11:05 AM

## 2012-04-06 ENCOUNTER — Other Ambulatory Visit: Payer: Self-pay | Admitting: Obstetrics & Gynecology

## 2012-04-06 DIAGNOSIS — D259 Leiomyoma of uterus, unspecified: Secondary | ICD-10-CM

## 2012-04-06 DIAGNOSIS — N920 Excessive and frequent menstruation with regular cycle: Secondary | ICD-10-CM

## 2012-04-08 ENCOUNTER — Inpatient Hospital Stay
Admission: RE | Admit: 2012-04-08 | Discharge: 2012-04-08 | Payer: Medicaid Other | Source: Ambulatory Visit | Attending: Obstetrics & Gynecology | Admitting: Obstetrics & Gynecology

## 2012-04-13 ENCOUNTER — Ambulatory Visit: Payer: Medicaid Other

## 2012-04-14 ENCOUNTER — Inpatient Hospital Stay: Admission: RE | Admit: 2012-04-14 | Payer: Medicaid Other | Source: Ambulatory Visit

## 2012-04-21 ENCOUNTER — Inpatient Hospital Stay: Admission: RE | Admit: 2012-04-21 | Payer: Medicaid Other | Source: Ambulatory Visit

## 2012-04-23 ENCOUNTER — Encounter: Payer: Self-pay | Admitting: Internal Medicine

## 2012-04-23 ENCOUNTER — Ambulatory Visit (INDEPENDENT_AMBULATORY_CARE_PROVIDER_SITE_OTHER): Payer: Medicaid Other | Admitting: Internal Medicine

## 2012-04-23 VITALS — BP 140/92 | HR 108 | Temp 97.8°F | Ht 66.0 in | Wt 186.8 lb

## 2012-04-23 DIAGNOSIS — R0602 Shortness of breath: Secondary | ICD-10-CM | POA: Insufficient documentation

## 2012-04-23 DIAGNOSIS — R0600 Dyspnea, unspecified: Secondary | ICD-10-CM

## 2012-04-23 DIAGNOSIS — R079 Chest pain, unspecified: Secondary | ICD-10-CM

## 2012-04-23 DIAGNOSIS — I2782 Chronic pulmonary embolism: Secondary | ICD-10-CM

## 2012-04-23 DIAGNOSIS — R0989 Other specified symptoms and signs involving the circulatory and respiratory systems: Secondary | ICD-10-CM

## 2012-04-23 NOTE — Assessment & Plan Note (Signed)
Sounds very atypical and anxiety related. However, I will get VQ scan and d-dimer (check urine pregnancy as well). Depending on this result, if cteph, then right heart cath v repeat echo High D-dimer will tell us she needs longer anticoagulation

## 2012-04-23 NOTE — Assessment & Plan Note (Signed)
Unclear cause ? Anxiety related. Has hx of asthma  GET PFTs and integrate with VQ, d-dimer results. Might a) need to consider dc asthma meds and re-establish true dx; b)  end up needing CPST

## 2012-04-23 NOTE — Patient Instructions (Addendum)
Do INR test, d-dimer and VQ scan; do urine pregnancy test for this so it is safe to do this test which involves doing xray Do PFT  ll call you with results of these tests to decide next step

## 2012-04-23 NOTE — Progress Notes (Addendum)
Subjective:    Patient ID: Megan Chandler, female    DOB: March 06, 1976, 36 y.o.   MRN: [id number]  HPI PCP is Genella Mech, MD  36 year old female Body mass index is 30.15 kg/(m^2).  reports that she quit smoking about 6 months ago. Known ashmatic since childhod - reportedly well controlled   IN Jan 2013 had some kind of surgery and post op had back pain. CT angio 07/26/11 ruled out PE but continued smoking and also was continued usage of her nuvaring. THen on 11/20/11 had acute symptoms. 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 Since then having chest pain and dyspnea despite coumadin anticoagulatin and INR > 2 (last check 04/03/12). Describes constant, burning,, right and central chest pain, stabbing quality, episodic waves without clear cut aggravating or relieving factors. In office she is clutching her chest in pain and demanding pain relief. There is also exertional dyspnea class 3 that is improved with rest. FU Ct angio July 2013 shows LLL ? Chronic PE evidence.  THere is no associatd cough, orthopnea, pnd, fever, sputum hemoptysis, edema  She has quit nuvaring and smoking since  May 2013 PE episode Denies syncope Denies prolonged immobilization Denies cancer Denies that asthma is active  Insists she is compliant with coumadin; last INR check sept 2013   Past Medical History  Diagnosis Date  . Urinary tract infection   . Chronic low back pain     buldging disc  . Endometriosis   . Bronchitis   . Headache     often but no migraines  . Arthritis   . History of bladder infections   . Bartholin's cyst     hx of  . Pulmonary embolism     11/2011  . Menometrorrhagia   . Adenomyosis   . Hypertension     but not on medication  . Asthma     daily inhaler use  . Anxiety   . Shortness of breath     due to asthma  . GERD (gastroesophageal reflux disease)   . Anemia   . History of blood transfusion June 2013     Family History  Problem Relation Age of Onset  .  Hypertension Mother   . Cancer Maternal Aunt   . Cancer Maternal Uncle   . Diabetes Maternal Grandmother   . Anesthesia problems Neg Hx   . Hypotension Neg Hx   . Malignant hyperthermia Neg Hx   . Pseudochol deficiency Neg Hx   . Stroke Other     aunt  . Sickle cell anemia Other      History   Social History  . Marital Status: Single    Spouse Name: N/A    Number of Children: N/A  . Years of Education: N/A   Occupational History  . disabled    Social History Main Topics  . Smoking status: Former Smoker    Types: Cigarettes, Cigars    Quit date: 09/28/2011  . Smokeless tobacco: Never Used   Comment: pt quit cigarettes in 2009, pt smoke 2 black and milds a day until 09-2011  . Alcohol Use: No  . Drug Use: No  . Sexually Active: Not Currently    Birth Control/ Protection: None     Nuvaring   Other Topics Concern  . Not on file   Social History Narrative  . No narrative on file     Allergies  Allergen Reactions  . Morphine And Related Anaphylaxis    Pt  can take dilaudid  . Aspirin Itching    Pt can tolerate ibuprofen  . Contrast Media (Iodinated Diagnostic Agents) Nausea And Vomiting  . Penicillins Itching  . Vicodin (Hydrocodone-Acetaminophen) Itching    Pt can take percocet     Outpatient Prescriptions Prior to Visit  Medication Sig Dispense Refill  . albuterol (PROVENTIL HFA;VENTOLIN HFA) 108 (90 BASE) MCG/ACT inhaler Inhale 2 puffs into the lungs every 4 (four) hours as needed. Takes for shortness of breath      . budesonide-formoterol (SYMBICORT) 80-4.5 MCG/ACT inhaler Inhale 2 puffs into the lungs 2 (two) times daily.  1 Inhaler  5  . cyclobenzaprine (FLEXERIL) 5 MG tablet Take 5 mg by mouth 2 (two) times daily as needed. For muscle spasms      . famotidine (PEPCID) 20 MG tablet Take 1 tablet (20 mg total) by mouth 2 (two) times daily.  60 tablet  3  . ferrous sulfate (FERROUSUL) 325 (65 FE) MG tablet Take 1 tablet (325 mg total) by mouth daily with  breakfast.  30 tablet  11  . gabapentin (NEURONTIN) 100 MG capsule Take 100 mg by mouth 3 (three) times daily.      Marland Kitchen oxyCODONE-acetaminophen (PERCOCET) 5-325 MG per tablet Take 2 tablets by mouth every 6 (six) hours as needed for pain.  30 tablet  0  . warfarin (COUMADIN) 5 MG tablet Take 2.5-5 mg by mouth daily. Pt takes 5 mg (1 tab) daily except 2.5 mg (0.5 tab) on Wednesday's      . Nafarelin Acetate (SYNAREL) 2 MG/ML SOLN Place 4 mLs into the nose 2 (two) times daily.       . megestrol (MEGACE) 40 MG tablet Take 120 mg by mouth 2 (two) times daily.            Review of Systems  Constitutional: Negative for fever and unexpected weight change.  HENT: Positive for sneezing. Negative for ear pain, nosebleeds, congestion, sore throat, rhinorrhea, trouble swallowing, dental problem, postnasal drip and sinus pressure.   Eyes: Negative for redness and itching.  Respiratory: Positive for cough and shortness of breath. Negative for chest tightness and wheezing.   Cardiovascular: Negative for palpitations and leg swelling.  Gastrointestinal: Negative for nausea and vomiting.  Genitourinary: Negative for dysuria.  Musculoskeletal: Negative for joint swelling.  Skin: Negative for rash.  Neurological: Positive for headaches.  Hematological: Does not bruise/bleed easily.  Psychiatric/Behavioral: Negative for dysphoric mood. The patient is nervous/anxious.        Objective:   Physical Exam  Vitals reviewed. Constitutional: She is oriented to person, place, and time. She appears well-developed and well-nourished. No distress.       Body mass index is 30.15 kg/(m^2).   HENT:  Head: Normocephalic and atraumatic.  Right Ear: External ear normal.  Left Ear: External ear normal.  Mouth/Throat: Oropharynx is clear and moist. No oropharyngeal exudate.  Eyes: Conjunctivae normal and EOM are normal. Pupils are equal, round, and reactive to light. Right eye exhibits no discharge. Left eye exhibits no  discharge. No scleral icterus.  Neck: Normal range of motion. Neck supple. No JVD present. No tracheal deviation present. No thyromegaly present.  Cardiovascular: Normal rate, regular rhythm, normal heart sounds and intact distal pulses.  Exam reveals no gallop and no friction rub.   No murmur heard. Pulmonary/Chest: Effort normal and breath sounds normal. No respiratory distress. She has no wheezes. She has no rales. She exhibits no tenderness.       Clutching chest  in pain  Abdominal: Soft. Bowel sounds are normal. She exhibits no distension and no mass. There is no tenderness. There is no rebound and no guarding.  Musculoskeletal: Normal range of motion. She exhibits no edema and no tenderness.  Lymphadenopathy:    She has no cervical adenopathy.  Neurological: She is alert and oriented to person, place, and time. She has normal reflexes. No cranial nerve deficit. She exhibits normal muscle tone. Coordination normal.  Skin: Skin is warm and dry. No rash noted. She is not diaphoretic. No erythema. No pallor.  Psychiatric: She has a normal mood and affect. Her behavior is normal. Judgment and thought content normal.       Very anxious          Assessment & Plan:

## 2012-04-27 ENCOUNTER — Encounter (HOSPITAL_COMMUNITY): Payer: Medicaid Other

## 2012-04-27 ENCOUNTER — Telehealth: Payer: Self-pay | Admitting: Internal Medicine

## 2012-04-27 NOTE — Telephone Encounter (Signed)
LMOAM for patient to return my call to R/S VQ Scan. Megan Chandler

## 2012-04-27 NOTE — Telephone Encounter (Signed)
Called patient and she stated that she did not have transportation this morning. Patient depends on Medicaid transportation to take her to appointments and they stated that they couldn't bring her this morning. VQ scan has been rescheduled for Friday 05/01/12 at 9:30 for CXR then VQ scan, at [facility] No prep and pt will arrive at 9:15 and check in at the Radiology Department. Pt is aware of this appointment, date, time and location. Nadyne J Cobb

## 2012-04-28 ENCOUNTER — Ambulatory Visit
Admission: RE | Admit: 2012-04-28 | Discharge: 2012-04-28 | Disposition: A | Discharge status: Home / Self Care | Payer: Medicaid Other | Source: Ambulatory Visit | Attending: Obstetrics & Gynecology | Admitting: Obstetrics & Gynecology

## 2012-04-28 DIAGNOSIS — D259 Leiomyoma of uterus, unspecified: Secondary | ICD-10-CM

## 2012-04-28 DIAGNOSIS — N920 Excessive and frequent menstruation with regular cycle: Secondary | ICD-10-CM

## 2012-05-01 ENCOUNTER — Encounter (HOSPITAL_COMMUNITY)
Admission: RE | Admit: 2012-05-01 | Discharge: 2012-05-01 | Disposition: A | Discharge status: Home / Self Care | Payer: Medicaid Other | Source: Ambulatory Visit | Attending: Internal Medicine | Admitting: Internal Medicine

## 2012-05-01 DIAGNOSIS — I2782 Chronic pulmonary embolism: Secondary | ICD-10-CM

## 2012-05-01 DIAGNOSIS — R0600 Dyspnea, unspecified: Secondary | ICD-10-CM

## 2012-05-01 DIAGNOSIS — Z86718 Personal history of other venous thrombosis and embolism: Secondary | ICD-10-CM | POA: Insufficient documentation

## 2012-05-01 DIAGNOSIS — R0609 Other forms of dyspnea: Secondary | ICD-10-CM | POA: Insufficient documentation

## 2012-05-01 DIAGNOSIS — R0789 Other chest pain: Secondary | ICD-10-CM | POA: Insufficient documentation

## 2012-05-01 DIAGNOSIS — R0989 Other specified symptoms and signs involving the circulatory and respiratory systems: Secondary | ICD-10-CM | POA: Insufficient documentation

## 2012-05-01 DIAGNOSIS — R0602 Shortness of breath: Secondary | ICD-10-CM | POA: Insufficient documentation

## 2012-05-01 DIAGNOSIS — R079 Chest pain, unspecified: Secondary | ICD-10-CM

## 2012-05-01 MED ORDER — TECHNETIUM TO 99M ALBUMIN AGGREGATED
5.6000 | Freq: Once | INTRAVENOUS | Status: AC | PRN
  Administered 2012-05-01: 6 via INTRAVENOUS

## 2012-05-01 MED ORDER — TECHNETIUM TC 99M DIETHYLENETRIAME-PENTAACETIC ACID: 47.5000 | Freq: Once | INTRAVENOUS | Status: DC | PRN

## 2012-05-04 ENCOUNTER — Telehealth: Payer: Self-pay | Admitting: Internal Medicine

## 2012-05-04 NOTE — Telephone Encounter (Signed)
lmomtcb x1 

## 2012-05-04 NOTE — Telephone Encounter (Signed)
Per 10.17.13 ov w/ MR:  Patient Instructions     Do INR test, d-dimer and VQ scan; do urine pregnancy test for this so it is safe to do this test which involves doing xray  Do PFT  ll call you with results of these tests to decide next step    Pt did not do labs, nor PFT > but had VQ and cxr done 10.25.13.  Pt requesting results.  LMOM TCB x1.

## 2012-05-04 NOTE — Telephone Encounter (Signed)
vQ scan 04/28/12 shows that in the left lung lower part the clot is yet to clear though it cleared in other parts of the left lung.   She needs to do repeat INR, urine pregnancy test  and d-dimer and PFT and come in to discuss with me about implications of these test results  The only optional test is urine pregnanacy - I ordered it so she is radiation safe for the VQ scan. Too bad she did not do it

## 2012-05-05 NOTE — Telephone Encounter (Signed)
[id number] x 1 

## 2012-05-05 NOTE — Telephone Encounter (Signed)
Pt is aware of MR recs and plan. She will come in next week for bloodwork. PFT has been scheduled for 06/03/12 @ 3pm here in our office and will see MR the same day at 4pm.

## 2012-06-03 ENCOUNTER — Ambulatory Visit: Payer: Medicaid Other | Admitting: Internal Medicine

## 2012-06-25 ENCOUNTER — Ambulatory Visit: Payer: Self-pay | Admitting: Internal Medicine

## 2012-07-20 ENCOUNTER — Ambulatory Visit: Payer: Self-pay | Admitting: Internal Medicine

## 2012-08-14 ENCOUNTER — Encounter: Payer: Self-pay | Admitting: Internal Medicine

## 2012-08-14 ENCOUNTER — Ambulatory Visit (INDEPENDENT_AMBULATORY_CARE_PROVIDER_SITE_OTHER): Payer: Medicaid Other | Admitting: Internal Medicine

## 2012-08-14 ENCOUNTER — Other Ambulatory Visit: Payer: Medicaid Other

## 2012-08-14 VITALS — BP 122/80 | HR 114 | Temp 98.6°F | Ht 66.0 in | Wt 171.0 lb

## 2012-08-14 DIAGNOSIS — Z01811 Encounter for preprocedural respiratory examination: Secondary | ICD-10-CM | POA: Insufficient documentation

## 2012-08-14 DIAGNOSIS — I2699 Other pulmonary embolism without acute cor pulmonale: Secondary | ICD-10-CM

## 2012-08-14 DIAGNOSIS — J45909 Unspecified asthma, uncomplicated: Secondary | ICD-10-CM

## 2012-08-14 DIAGNOSIS — Z86711 Personal history of pulmonary embolism: Secondary | ICD-10-CM

## 2012-08-14 DIAGNOSIS — R0602 Shortness of breath: Secondary | ICD-10-CM

## 2012-08-14 LAB — PULMONARY FUNCTION TEST

## 2012-08-14 MED ORDER — BUDESONIDE-FORMOTEROL FUMARATE 80-4.5 MCG/ACT IN AERO: 2.0000 | INHALATION_SPRAY | Freq: Two times a day (BID) | RESPIRATORY_TRACT | Status: DC

## 2012-08-14 NOTE — Progress Notes (Signed)
Subjective:    Patient ID: Megan Chandler, female    DOB: 07-30-1975, 37 y.o.   MRN: [id number]  HPI PCP is Genella Mech, MD  # Overweight  - Body mass index is 27.60 kg/(m^2).  #ASthnma  - Since childhood. Unclear history. Reportedly intermittently on Symbicort  #Ex-smoker  - Quit smoking May 2013 at time of PE diagnosis  #Pulmonary EMbolism  -  IN Jan 2013 had some kind of surgery and post op had back pain. CT angio 07/26/11 ruled out PE - Diagnosed  Large Left PE 11/20/11 with acute symptoms in setting of  smoking and also was continued usage of her nuvaring. RXl DC Nuvaring, quit smoking, coumadin - vQ scan 04/28/12 shows that in the left lung lower part the clot is yet to clear though it cleared in other parts of the left lung.  - Took coumadin 11/20/11 through Dec 2013; self stopped coumadin  Because "I do not like meds" (last check was Sept 2013 with INR >2 and then never followed up)  #Anxiety  - petrified on heparin or lovenox inpatient injections   Office visit 08/14/2012 Megan Chandler presents for followup. I was supposed to see her back in October 2013 after her VQ scan mentioned above what she failed to show up. Of note she had had pulmonary embolism in May 2013 in the setting of smoking and nuvaring use. She took Coumadin from May to September with monitoring that showed proper compliance and therapeutic INR. In October she had a VQ scan that showed improvement in her clot burden but with some residual and a left lower lobe. After this she stopped following up although she says she continued with Coumadin December 2013 and then just got fed up with it and stopped it. At this point she denies any active complaints although mild shortness of breath with exertion persists [name] is a poor historian and history fluctuates due to her anxiety and her poor history historical quality]. She also says her that her asthma is stable and well controlled although she has run out of  her Symbicort and albuterol. Of note, the quality of asthma diagnosis itself is very questionable and needs further workup in the future  She now needs a total abdominal hysterectomy and is been referred by Dr. Antionette Char for pulmonary clearance    Walking desaturation test on 08/14/2012 185 feet x 3 laps:  did NOT* desaturate. Rest pulse ox was 98%, final pulse ox was 98%. HR response *?/min at rest to 123/min at peak exertion.    Pulmonary function test shows FEV1 of 2.5 L 83% with a bronchodilator response of 11%. FEV1 FEC ratio of 82 and normal. Total lung capacity of 4.3 L/79%. Diffusion capacity lower a 14/55%. Other than the low diffusion this is a normal test. It is reassuring that she did not desaturate with exertion and this low diffusion could be a technical issue   Review of Systems  Constitutional: Negative for fever and unexpected weight change.  HENT: Negative for ear pain, nosebleeds, congestion, sore throat, rhinorrhea, sneezing, trouble swallowing, dental problem, postnasal drip and sinus pressure.   Eyes: Negative for redness and itching.  Respiratory: Negative for cough, chest tightness, shortness of breath and wheezing.   Cardiovascular: Negative for palpitations and leg swelling.  Gastrointestinal: Negative for nausea and vomiting.  Genitourinary: Negative for dysuria.  Musculoskeletal: Negative for joint swelling.  Skin: Negative for rash.  Neurological: Negative for headaches.  Hematological: Does not bruise/bleed easily.  Psychiatric/Behavioral: Negative for dysphoric mood. The patient is not nervous/anxious.        Objective:   Physical Exam Vitals reviewed. Constitutional: She is oriented to person, place, and time. She appears well-developed and well-nourished. No distress.  Body mass index is 27.60 kg/(m^2). Marland Kitchen   HENT:  Head: Normocephalic and atraumatic.  Right Ear: External ear normal.  Left Ear: External ear normal.  Mouth/Throat:  Oropharynx is clear and moist. No oropharyngeal exudate.  Eyes: Conjunctivae normal and EOM are normal. Pupils are equal, round, and reactive to light. Right eye exhibits no discharge. Left eye exhibits no discharge. No scleral icterus.  Neck: Normal range of motion. Neck supple. No JVD present. No tracheal deviation present. No thyromegaly present.  Cardiovascular: Normal rate, regular rhythm, normal heart sounds and intact distal pulses.  Exam reveals no gallop and no friction rub.   No murmur heard. Pulmonary/Chest: Effort normal and breath sounds normal. No respiratory distress. She has no wheezes. She has no rales. She exhibits no tenderness.       Clutching chest in pain  Abdominal: Soft. Bowel sounds are normal. She exhibits no distension and no mass. There is no tenderness. There is no rebound and no guarding.  Musculoskeletal: Normal range of motion. She exhibits no edema and no tenderness.  Lymphadenopathy:    She has no cervical adenopathy.  Neurological: She is alert and oriented to person, place, and time. She has normal reflexes. No cranial nerve deficit. She exhibits normal muscle tone. Coordination normal.  Skin: Skin is warm and dry. No rash noted. She is not diaphoretic. No erythema. No pallor.  Psychiatric: She has a normal mood and affect. Her behavior is normal. Judgment and thought content normal.          Assessment & Plan:

## 2012-08-14 NOTE — Patient Instructions (Addendum)
#  asthma  - Appears controlled - Take Symbicort 80/4.5, 2 puff 2 times a day scheduled and albuterol as needed - Take Symbicort without fail because we do not want any asthma attack during surgery  #Blood clot in lung - Do d-dimer blood test ; based on this I might recommend daily aspirin therapy  #Preoperative clearance prior to hysterectomy - A lung standpoint it is okay to have this hysterectomy surgery but keep in mind that there is an increased risk for blood clotting and asthma attack after the surgery - That is really important that you continue Symbicort until you  see me again after the surgery  - After the surgery I lasted doctors to give you medications to prevent blood clots: Because you are scared of injection needles I will tell them to give you xarelto 10 mg once a day which is a preventative dose  #Followup - 2 months from now

## 2012-08-14 NOTE — Assessment & Plan Note (Signed)
Clinically appears well controlled on no therapy. However the upcoming hysterectomy oh place her on Symbicort. After her hysterectomy I will plan to stop her Symbicort and subjected to methacholine challenge test to make sure that she definitely has asthma because I'm not so sure if she has asthma or vocal cord dysfunction

## 2012-08-14 NOTE — Progress Notes (Signed)
PFT done today. 

## 2012-08-14 NOTE — Assessment & Plan Note (Signed)
She completed Coumadin therapy May 2013 through December 2013 which is over 6 months of therapy of which may through September which is 4 months of therapy was definite compliance and objective evidence of therapeutic INR. Risk factors for pulmonaryy embolism was related to her weight Royetta Car is now improved], smoking [she is quit] and nuvaring ] she has stopped this]. Please restart her son are resolved and therefore I do not see any more need for Coumadin therapy especially with October 2013 VQ scan showing significant improvement in clot burden. Nevertheless I will check a d-dimer test today and if it is elevated we'll place her on baby aspirin daily for life so as to prevent a recurrent blood clot. Note: If she is a recurrent PE she's is to be on Coumadin for life  > 50% of this > 25 min visit spent in face to face counseling (15 min visit converted to 25 min)

## 2012-08-14 NOTE — Assessment & Plan Note (Signed)
Low Risk for pulmonary complications from hysterectomy. Main risk is recurrence for pulmonary embolism in the postoperative setting and asthma exacerbation. I will strongly encourage early mobilization and DVT prophylaxis 24 hours after surgery. She is extremely petrified of needles and does not want subcutaneous heparin or Lovenox therefore we'll recommend 10 mg of xarelto which is a prophylactic dose. He needs to be counseled on the fact that there is no reversal for bleeding from xarelto although the risk will be low. Continued Symbicort to prevent as asthma exacerbation  > 50% of this > 25 min visit spent in face to face counseling (15 min visit converted to 25 min)

## 2012-08-15 LAB — D-DIMER, QUANTITATIVE: D-Dimer, Quant: 0.39 ug/mL-FEU (ref 0.00–0.48)

## 2012-08-16 ENCOUNTER — Telehealth: Payer: Self-pay | Admitting: Internal Medicine

## 2012-08-16 NOTE — Telephone Encounter (Signed)
That her know that d-dimer is normal. No more Coumadin needed. I would recommend baby aspirin 81 mg once daily after her hysterectomy. If She is reluctant to do it please have her call me to discuss after her hysterectomy

## 2012-08-17 NOTE — Telephone Encounter (Signed)
Pt is advised.Megan Chandler, CMA  

## 2012-08-17 NOTE — Telephone Encounter (Signed)
LMTCbx1. Jennifer Castillo, CMA   

## 2012-08-27 ENCOUNTER — Telehealth: Payer: Self-pay | Admitting: Internal Medicine

## 2012-08-27 NOTE — Telephone Encounter (Signed)
I thought I cleared her at the last visit saying she is low risk. My instructions for DVT and pulmonary embolism prophylaxis are clearly laid out in my note that I believe I sent to Dr. Antionette Char. I'm not sure if she got it.. Please fax them a copy of my office note. Ensure they get it. I am going to send it to Epic as well   Dr. Kalman Shan, M.D., [facility].C.P Pulmonary and Critical Care Medicine Staff Physician [facility] Pager: [phone], If no answer or between  15:00h - 7:00h: call [phone]  08/27/2012 3:22 PM

## 2012-08-27 NOTE — Telephone Encounter (Signed)
I have printed the last ov note and faxed to Dr. Tamela Oddi Received confirmation that the fax went through Pt aware and states nothing further needed

## 2012-08-27 NOTE — Telephone Encounter (Signed)
MR, pt was last seen on 08/14/12. Dr. Antionette Char needs pulmonary clearance for pt to have a hysterectomy.  Pls advise if this is ok.  Thank you.  lmomtcb to inform pt request has been sent to MR to advise.

## 2012-08-27 NOTE — Telephone Encounter (Signed)
Pt returned call. I advised her that the nurse has routed this to MR's attention and they will call her back when this has been completed. Pt understands this and will wait for call back. Megan Chandler

## 2012-08-27 NOTE — Telephone Encounter (Signed)
Pt called back & states this letter needs to be faxed to [phone].   Antionette Fairy

## 2012-08-28 ENCOUNTER — Telehealth: Payer: Self-pay | Admitting: Internal Medicine

## 2012-08-28 NOTE — Telephone Encounter (Signed)
lmomtcb x1 for pt 

## 2012-08-31 NOTE — Telephone Encounter (Signed)
Pt returned call. Megan Chandler  

## 2012-08-31 NOTE — Telephone Encounter (Addendum)
Pt called and states that Gyn is stating that she is wanting to give dose of blood thinners right before surgery d/t patient being high risk at getting blood clots. Pt is feeling very uneasy about this and would like Dr Jane Canary advise.  Pt was recently d/c from blood thinning therapy by MR and does not feel comfortable starting back.   Aware that MR in hospital and a page will be sent.   Please advise MR what to tell patient. Thanks.

## 2012-08-31 NOTE — Telephone Encounter (Signed)
LMTCBx1.Clance Baquero, CMA  

## 2012-08-31 NOTE — Telephone Encounter (Signed)
LMTCBx2. Monaca Wadas, CMA  

## 2012-08-31 NOTE — Telephone Encounter (Signed)
She needs blood clot prevention AFTER (not before) surgery. I have put this in my note and we sent anothger phone note to her GYN. Please get hold of GYN MD, Dr Antionette Char for me (can give my cell). Please tell patient I wil commuicate to GYN MD and that patient is to follow their advice

## 2012-09-01 NOTE — Telephone Encounter (Signed)
Patient returning call.

## 2012-09-01 NOTE — Telephone Encounter (Signed)
Called, spoke with pt.  I informed her of below per MR.  She verbalized understanding of this and is aware We will call Dr. Ilene Qua Moore's office to have her call MR.    Also, pt is requesting results of blood work done that was ordered by MR.  She would like these results faxed to GYN once MR advises on results.  MR, pls advise.  Thank you.  I called Dr. Marcia Brash office.  I spoke with Angie.  I gave Angie MR's cell # and asked she pls have Dr. Delora Fuel call him directly regarding pt.  Angie states she would relay msg to Dr. Tamela Oddi but it would be tomorrow before she could call back because dr is in surgery all day today.    Will route to MR to advise on labs and to be aware Dr. Tamela Oddi will be calling him.

## 2012-09-01 NOTE — Telephone Encounter (Signed)
LMTCB x 1 

## 2012-09-01 NOTE — Telephone Encounter (Signed)
LMOM TCB x1 to inform patient that her d dimer was normal and that her lab results are being printed and faxed to Dr Antionette Char (done, to [phone]); we are still waiting for her to call MR.

## 2012-09-01 NOTE — Telephone Encounter (Signed)
Lmtcbx2. Jennifer Castillo, CMA  

## 2012-09-01 NOTE — Telephone Encounter (Signed)
Called spoke with patient Informed her of her normal D Dimer result Lab results have been faxed to Dr Antionette Char Dr Tamela Oddi will call MR Pt okay with this and verbalized her understanding Denied any further questions/concerns Will sign and route to MR for follow up

## 2012-09-01 NOTE — Telephone Encounter (Signed)
Normal d dimer result Feb 2014. They can see the results in epic if they use epic, if not fax it toDr Antionette Char

## 2012-09-04 ENCOUNTER — Telehealth: Payer: Self-pay | Admitting: Internal Medicine

## 2012-09-04 NOTE — Telephone Encounter (Signed)
Spoke with Victorino Dike; states this Doctor should have MR's number; I called and made sure the MD had MR's cell number to call and will forward this message to MR as well.

## 2012-09-08 NOTE — Telephone Encounter (Signed)
I updated Dr. Tamela Oddi a few days ago

## 2012-09-16 ENCOUNTER — Telehealth: Payer: Self-pay | Admitting: Internal Medicine

## 2012-09-16 NOTE — Telephone Encounter (Signed)
Per MR on 09-08-12:  Kalman Shan, MD at 09/08/2012 5:53 PM   Status: Signed            I updated Dr. Tamela Oddi a few days ago   Pt is aware. Carron Curie, CMA

## 2012-09-16 NOTE — Telephone Encounter (Signed)
ATC patient, no answer LMOMTCB 

## 2012-09-17 ENCOUNTER — Encounter: Payer: Self-pay | Admitting: Internal Medicine

## 2012-09-21 ENCOUNTER — Encounter: Payer: Self-pay | Admitting: Internal Medicine

## 2012-09-23 ENCOUNTER — Emergency Department (HOSPITAL_COMMUNITY): Payer: Medicaid Other

## 2012-09-23 ENCOUNTER — Encounter (HOSPITAL_COMMUNITY): Payer: Self-pay | Admitting: Emergency Medicine

## 2012-09-23 ENCOUNTER — Emergency Department (HOSPITAL_COMMUNITY)
Admission: EM | Admit: 2012-09-23 | Discharge: 2012-09-23 | Disposition: A | Discharge status: Home / Self Care | Payer: Medicaid Other | Attending: Emergency Medicine | Admitting: Emergency Medicine

## 2012-09-23 DIAGNOSIS — Y9389 Activity, other specified: Secondary | ICD-10-CM | POA: Insufficient documentation

## 2012-09-23 DIAGNOSIS — Z87440 Personal history of urinary (tract) infections: Secondary | ICD-10-CM | POA: Insufficient documentation

## 2012-09-23 DIAGNOSIS — Z8709 Personal history of other diseases of the respiratory system: Secondary | ICD-10-CM | POA: Insufficient documentation

## 2012-09-23 DIAGNOSIS — M25569 Pain in unspecified knee: Secondary | ICD-10-CM | POA: Insufficient documentation

## 2012-09-23 DIAGNOSIS — Z8679 Personal history of other diseases of the circulatory system: Secondary | ICD-10-CM | POA: Insufficient documentation

## 2012-09-23 DIAGNOSIS — F411 Generalized anxiety disorder: Secondary | ICD-10-CM | POA: Insufficient documentation

## 2012-09-23 DIAGNOSIS — I10 Essential (primary) hypertension: Secondary | ICD-10-CM | POA: Insufficient documentation

## 2012-09-23 DIAGNOSIS — Z86718 Personal history of other venous thrombosis and embolism: Secondary | ICD-10-CM | POA: Insufficient documentation

## 2012-09-23 DIAGNOSIS — Y929 Unspecified place or not applicable: Secondary | ICD-10-CM | POA: Insufficient documentation

## 2012-09-23 DIAGNOSIS — Z3202 Encounter for pregnancy test, result negative: Secondary | ICD-10-CM | POA: Insufficient documentation

## 2012-09-23 DIAGNOSIS — G8929 Other chronic pain: Secondary | ICD-10-CM | POA: Insufficient documentation

## 2012-09-23 DIAGNOSIS — M545 Low back pain, unspecified: Secondary | ICD-10-CM | POA: Insufficient documentation

## 2012-09-23 DIAGNOSIS — S8392XA Sprain of unspecified site of left knee, initial encounter: Secondary | ICD-10-CM

## 2012-09-23 DIAGNOSIS — R55 Syncope and collapse: Secondary | ICD-10-CM

## 2012-09-23 DIAGNOSIS — D649 Anemia, unspecified: Secondary | ICD-10-CM | POA: Insufficient documentation

## 2012-09-23 DIAGNOSIS — Z79899 Other long term (current) drug therapy: Secondary | ICD-10-CM | POA: Insufficient documentation

## 2012-09-23 DIAGNOSIS — K219 Gastro-esophageal reflux disease without esophagitis: Secondary | ICD-10-CM | POA: Insufficient documentation

## 2012-09-23 DIAGNOSIS — W07XXXA Fall from chair, initial encounter: Secondary | ICD-10-CM | POA: Insufficient documentation

## 2012-09-23 DIAGNOSIS — M129 Arthropathy, unspecified: Secondary | ICD-10-CM | POA: Insufficient documentation

## 2012-09-23 DIAGNOSIS — IMO0002 Reserved for concepts with insufficient information to code with codable children: Secondary | ICD-10-CM | POA: Insufficient documentation

## 2012-09-23 DIAGNOSIS — N809 Endometriosis, unspecified: Secondary | ICD-10-CM | POA: Insufficient documentation

## 2012-09-23 DIAGNOSIS — Z87891 Personal history of nicotine dependence: Secondary | ICD-10-CM | POA: Insufficient documentation

## 2012-09-23 DIAGNOSIS — Z8742 Personal history of other diseases of the female genital tract: Secondary | ICD-10-CM | POA: Insufficient documentation

## 2012-09-23 DIAGNOSIS — J45909 Unspecified asthma, uncomplicated: Secondary | ICD-10-CM | POA: Insufficient documentation

## 2012-09-23 LAB — BASIC METABOLIC PANEL
GFR calc non Af Amer: >90 mL/min (ref >90)
Glucose, Bld: 80 mg/dL (ref 70–99)
Potassium: 4.3 mEq/L (ref 3.5–5.1)
Sodium: 139 mEq/L (ref 135–145)

## 2012-09-23 LAB — CBC
Hemoglobin: 11.1 g/dL — ABNORMAL LOW (ref 12.0–15.0)
MCHC: 31.2 g/dL (ref 30.0–36.0)
RBC: 4.59 MIL/uL (ref 3.87–5.11)
WBC: 11.8 10*3/uL — ABNORMAL HIGH (ref 4.0–10.5)

## 2012-09-23 LAB — GLUCOSE, CAPILLARY: Glucose-Capillary: 94 mg/dL (ref 70–99)

## 2012-09-23 MED ORDER — HYDROCODONE-ACETAMINOPHEN 5-325 MG PO TABS
1.0000 | ORAL_TABLET | Freq: Once | ORAL | Status: AC | PRN
  Administered 2012-09-23: 1 via ORAL
  Filled 2012-09-23: qty 1

## 2012-09-23 MED ORDER — TRAMADOL HCL 50 MG PO TABS: 50.0000 mg | ORAL_TABLET | Freq: Four times a day (QID) | ORAL | Status: DC | PRN

## 2012-09-23 NOTE — ED Notes (Signed)
States that 2 days ago she states that her cousin found her on the floor shaking all over. States that she has pain right side pain from where she fell off the bar stool she was sitting on that same day.

## 2012-09-23 NOTE — ED Notes (Signed)
Per EMS: Pt states she has "felt weird" for two days.  Stopped taking her pain medication 2 days ago.

## 2012-09-23 NOTE — ED Notes (Signed)
Pt to xray

## 2012-09-23 NOTE — ED Notes (Signed)
md at bedside

## 2012-09-23 NOTE — ED Notes (Signed)
Pt escorted to discharge window. Pt verbalized understanding discharge instructions. In no acute distress.  

## 2012-09-23 NOTE — ED Provider Notes (Signed)
History    CSN: [id number] Arrival date & time 09/23/12  1342 First MD Initiated Contact with Patient 09/23/12 1504    Chief Complaint  Patient presents with  . Weakness  . Anxiety    HPI Pt was sitting in a bar stool a couple of days ago.  She was not drinking alcohol but she started to feel funny.  The next thing she knows she was lying on the floor.  Pt did not feel well enough yesterday to get evaluated.  Today she is still hurting.  She hurts all over but primarily in her left leg.  No shortness of breath.  Some chest pain.  No vomiting or diarrhea.  No dysuria.    She does have history of back problems and the pain has gone down to her leg in the past.  She is able to walk but is having pain in her left knee and leg.  It increases with walking.  She did run out of her pain medications that she takes for her back.  Pt is scheduled for surgery for her endometriosis.  She has history of PE but has not been having trouble with chest pain or shortness of breath.    Past Medical History  Diagnosis Date  . Urinary tract infection   . Chronic low back pain     buldging disc  . Endometriosis   . Bronchitis   . Headache     often but no migraines  . Arthritis   . History of bladder infections   . Bartholin's cyst     hx of  . Pulmonary embolism     11/2011  . Menometrorrhagia   . Adenomyosis   . Hypertension     but not on medication  . Asthma     daily inhaler use  . Anxiety   . Shortness of breath     due to asthma  . GERD (gastroesophageal reflux disease)   . Anemia   . History of blood transfusion June 2013    Past Surgical History  Procedure Laterality Date  . Dilation and curettage of uterus    . Cholecystectomy, laparoscopic  1998  . Umbilical hernia repair      as a child  . Cesarean section  1996  . Laparoscopy  at age 1  . Cervical spine surgery  2009  . Back surgery      07/2011  . Lumbar spine surgery    . Laparoscopic tubal ligation  02/12/2012     Procedure: LAPAROSCOPIC TUBAL LIGATION;  Surgeon: Antionette Char, MD;  Location: [facility];  Service: Gynecology;  Laterality: Bilateral;  . Tubal ligation    . Hysteroscopy  04/02/2012    Procedure: HYSTEROSCOPY;  Surgeon: Antionette Char, MD;  Location: [facility];  Service: Gynecology;  Laterality: N/A;  with Dilitation and curretage and attempted hydrothermal ablation    Family History  Problem Relation Age of Onset  . Hypertension Mother   . Cancer Maternal Aunt   . Cancer Maternal Uncle   . Diabetes Maternal Grandmother   . Anesthesia problems Neg Hx   . Hypotension Neg Hx   . Malignant hyperthermia Neg Hx   . Pseudochol deficiency Neg Hx   . Stroke Other     aunt  . Sickle cell anemia Other     History  Substance Use Topics  . Smoking status: Former Smoker -- 0.50 packs/day    Types: Cigarettes, Cigars    Start date: 07/09/1995  Quit date: 11/20/2011  . Smokeless tobacco: Never Used     Comment: pt quit cigarettes in 2009, pt smoke 2 black and milds a day until 09-2011  . Alcohol Use: 0.0 oz/week     Comment: Occasionally, "maybe 1-2 times/month"    OB History   Grav Para Term Preterm Abortions TAB SAB Ect Mult Living   2 1 1  0 1 0 1 0 0 1      Review of Systems  Constitutional: Negative for fever.  Respiratory: Negative for cough.   Genitourinary: Negative for dysuria.  Neurological: Positive for syncope. Negative for dizziness, tremors and speech difficulty.  Psychiatric/Behavioral: Negative for confusion.  All other systems reviewed and are negative.    Allergies  Morphine and related; Aspirin; Contrast media; Penicillins; and Vicodin  Home Medications   Current Outpatient Rx  Name  Route  Sig  Dispense  Refill  . albuterol (PROVENTIL HFA;VENTOLIN HFA) 108 (90 BASE) MCG/ACT inhaler   Inhalation   Inhale 2 puffs into the lungs every 4 (four) hours as needed for shortness of breath.          . Aspirin-Salicylamide-Caffeine (BC HEADACHE POWDER  PO)   Oral   Take 2 packets by mouth daily as needed (for pain.).         Marland Kitchen budesonide-formoterol (SYMBICORT) 80-4.5 MCG/ACT inhaler   Inhalation   Inhale 2 puffs into the lungs 2 (two) times daily.   1 Inhaler   5   . famotidine (PEPCID) 20 MG tablet   Oral   Take 20 mg by mouth 2 (two) times daily as needed for heartburn.         . ferrous sulfate 325 (65 FE) MG tablet   Oral   Take 325 mg by mouth daily.         Marland Kitchen HYDROcodone-acetaminophen (NORCO/VICODIN) 5-325 MG per tablet   Oral   Take 1 tablet by mouth every 4 (four) hours as needed for pain.         . medroxyPROGESTERone (PROVERA) 10 MG tablet   Oral   Take 20 mg by mouth daily.         . traMADol (ULTRAM) 50 MG tablet   Oral   Take 1 tablet (50 mg total) by mouth every 6 (six) hours as needed for pain.   15 tablet   0     BP 126/76  Pulse 78  Temp(Src) 98.9 F (37.2 C) (Oral)  Resp 22  SpO2 100%  Physical Exam  Nursing note and vitals reviewed. Constitutional: She appears well-developed and well-nourished. No distress.  HENT:  Head: Normocephalic and atraumatic.  Right Ear: External ear normal.  Left Ear: External ear normal.  Eyes: Conjunctivae are normal. Right eye exhibits no discharge. Left eye exhibits no discharge. No scleral icterus.  Neck: Neck supple. No tracheal deviation present.  Cardiovascular: Normal rate, regular rhythm and intact distal pulses.   Pulmonary/Chest: Effort normal and breath sounds normal. No stridor. No respiratory distress. She has no wheezes. She has no rales.  Abdominal: Soft. Bowel sounds are normal. She exhibits no distension. There is no tenderness. There is no rebound and no guarding.  Musculoskeletal: She exhibits tenderness. She exhibits no edema.       Left knee: She exhibits normal range of motion, no swelling, no effusion, no ecchymosis and no deformity. Tenderness found.       Right lower leg: She exhibits tenderness and bony tenderness. She exhibits  no swelling, no edema  and no deformity.  Neurological: She is alert. She has normal strength. No sensory deficit. Cranial nerve deficit:  no gross defecits noted. She exhibits normal muscle tone. She displays no seizure activity. Coordination normal.  Skin: Skin is warm and dry. No rash noted.  Psychiatric: She has a normal mood and affect.    ED Course  Procedures (including critical care time)  Rate: 83  Rhythm: normal sinus rhythm  QRS Axis: normal  Intervals: normal  ST/T Wave abnormalities: normal  Conduction Disutrbances:none  Narrative Interpretation:   Old EKG Reviewed: none available  Labs Reviewed  CBC - Abnormal; Notable for the following:    WBC 11.8 (*)    Hemoglobin 11.1 (*)    HCT 35.6 (*)    MCV 77.6 (*)    MCH 24.2 (*)    RDW 18.0 (*)    Platelets 553 (*)    All other components within normal limits  BASIC METABOLIC PANEL - Abnormal; Notable for the following:    BUN 5 (*)    All other components within normal limits  GLUCOSE, CAPILLARY  PREGNANCY, URINE   Dg Tibia/fibula Left  09/23/2012  *RADIOLOGY REPORT*  Clinical Data: Fall with left lower leg pain.  LEFT TIBIA AND FIBULA - 2 VIEW  Comparison: None  Findings: No evidence of acute fracture, subluxation or dislocation identified.  No radio-opaque foreign bodies are present.  No focal bony lesions are noted.  The joint spaces are unremarkable.  IMPRESSION: Unremarkable exam   Original Report Authenticated By: Harmon Pier, M.D.    Dg Knee Complete 4 Views Left  09/23/2012  *RADIOLOGY REPORT*  Clinical Data: Fall with left knee pain.  LEFT KNEE - COMPLETE 4+ VIEW  Comparison: None  Findings: No evidence of acute fracture, subluxation or dislocation identified.  No joint effusion noted.  No radio-opaque foreign bodies are present.  No focal bony lesions are noted.  The joint spaces are unremarkable.  IMPRESSION: Unremarkable left knee.   Original Report Authenticated By: Harmon Pier, M.D.    Dg Knee Complete 4  Views Right  09/23/2012  *RADIOLOGY REPORT*  Clinical Data: Fall with right knee pain.  RIGHT KNEE - COMPLETE 4+ VIEW  Comparison: None  Findings: A small knee effusion is present.  Mild irregularity of the lateral aspect of the lateral tibial plateau is noted and a nondisplaced fracture is not excluded. No other fracture, subluxation or dislocation identified. No focal bony lesions are noted.  IMPRESSION: Mild irregularity of the lateral aspect of the lateral tibial plateau - fracture not excluded.  Small knee effusion.   Original Report Authenticated By: Harmon Pier, M.D.      1. Syncope   2. Knee sprain and strain, left, initial encounter       MDM  Syncope No abnormalities noted to account for her syncope.  No anemia.  No dehydration.  Pt without signs of DVT.  No tachycardia.  Doubt DVT, PE.  Lower extrem pain, Pt's xray results discussed with patient.  Pt is not having pain in her right knee.  I inadvertently ordered xrays on right side when it was her left leg that was injured.  No sign of fracture.  Externally she has no sign of injury or swelling.  Pt has an orthopedic doctor appointment later this week.   At this time there does not appear to be any evidence of an acute emergency medical condition and the patient appears stable for discharge with appropriate outpatient follow up.  Celene Kras, MD 09/23/12 [phone]

## 2012-09-28 ENCOUNTER — Encounter (HOSPITAL_COMMUNITY): Payer: Self-pay | Admitting: Pharmacy Technician

## 2012-10-01 ENCOUNTER — Encounter (HOSPITAL_COMMUNITY): Payer: Self-pay

## 2012-10-01 ENCOUNTER — Encounter (HOSPITAL_COMMUNITY)
Admission: RE | Admit: 2012-10-01 | Discharge: 2012-10-01 | Disposition: A | Discharge status: Home / Self Care | Payer: Medicaid Other | Source: Ambulatory Visit | Attending: Obstetrics & Gynecology | Admitting: Obstetrics & Gynecology

## 2012-10-01 LAB — SURGICAL PCR SCREEN
MRSA, PCR: NEGATIVE
Staphylococcus aureus: NEGATIVE

## 2012-10-01 LAB — CBC
Hemoglobin: 12.6 g/dL (ref 12.0–15.0)
MCHC: 31.7 g/dL (ref 30.0–36.0)
RDW: 18 % — ABNORMAL HIGH (ref 11.5–15.5)

## 2012-10-01 LAB — BASIC METABOLIC PANEL
BUN: 5 mg/dL — ABNORMAL LOW (ref 6–23)
Calcium: 10.1 mg/dL (ref 8.4–10.5)
Creatinine, Ser: 0.76 mg/dL (ref 0.50–1.10)
Glucose, Bld: 92 mg/dL (ref 70–99)
Potassium: 3.9 mEq/L (ref 3.5–5.1)
Sodium: 137 mEq/L (ref 135–145)

## 2012-10-01 NOTE — Progress Notes (Signed)
Medicaid form for hysterectomy front of chart for MD to complete

## 2012-10-01 NOTE — Patient Instructions (Addendum)
Megan Chandler  10/01/2012   Your procedure is scheduled on:  10/09/12 FRIDAY  Report to [facility] at  0530     AM.  Call this number if you have problems the morning of surgery: [phone]       Remember: [name] WITH YOU TO HOSPITAL  Do not eat food  Or drink :After Midnight. Thursday NIGHT   Take these medicines the morning of surgery with A SIP OF WATER: Pepcid,  Symbicort          Albuterol if needed  .  Contacts, dentures or partial plates can not be worn to surgery  Leave suitcase in the car. After surgery it may be brought to your room.  For patients admitted to the hospital, checkout time is 11:00 AM day of  discharge.             SPECIAL INSTRUCTIONS- SEE [facility] PREPARING FOR SURGERY INSTRUCTION SHEET-     DO NOT WEAR JEWELRY, LOTIONS, POWDERS, OR PERFUMES.  WOMEN-- DO NOT SHAVE LEGS OR UNDERARMS FOR 12 HOURS BEFORE SHOWERS. MEN MAY SHAVE FACE.  Patients discharged the day of surgery will not be allowed to drive home. IF going home the day of surgery, you must have a driver and someone to stay with you for the first 24 hours  Name and phone number of your driver:   mother                                                                     Please read over the following fact sheets that you were given: MRSA Information, Incentive Spirometry Sheet, Blood Transfusion Sheet  Information                                                                                   [name]  PST [phone]  [phone]                 FAILURE TO FOLLOW THESE INSTRUCTIONS MAY RESULT IN  CANCELLATION   OF YOUR SURGERY                                                  Patient Signature _____________________________

## 2012-10-01 NOTE — Progress Notes (Signed)
Urine pregnancy 3/19 EPIC, with CBC, BMET, chest x ray, with pulmonary perfusion study 10/13 EPIC,  EKG 3/14 EPIC, clearance telephone note Dr Michiel Sites 08/27/12 EPIC

## 2012-10-07 NOTE — H&P (Signed)
Subjective:  Megan Chandler is a 37 y.o. female.  She has a several year h/o  irregular bleeding.  Onset of symptoms was gradual starting several years ago with unchanged course since that time. Bleeding is characterized as moderate.   Evaluation to date: D & C: negative, hysteroscopy: negative, laparoscopy: negative and pelvic ultrasound: positive for possible adenomyosis. Treatment to date: depo lupron therapy, prior endometrial implant fulguration and nuva ring  Pertinent Gyn History:  Menses irregular Bleeding: see above Contraception: tubal ligation  Last pap: normal Date: 3/14  Patient Active Problem List   Diagnosis Date Noted  . Preoperative respiratory examination 08/14/2012  . Chest pain 04/23/2012  . Dyspnea 04/23/2012  . 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 Anxiety 11/20/2011  . Anemia 11/20/2011  . Endometriosis 11/20/2011  . Asthma 11/20/2011  . Active smoker 11/20/2011  . Menometrorrhagia 02/15/2011   Past Medical History  Diagnosis Date  . Urinary tract infection   . Chronic low back pain     buldging disc  . Endometriosis   . Bronchitis   . Headache     often but no migraines  . Arthritis   . History of bladder infections   . Bartholin's cyst     hx of  . Pulmonary embolism     11/2011/ states no since  anticoagulation 1/14  . Menometrorrhagia   . Adenomyosis   . Hypertension     but not on medication  . Asthma     daily inhaler use  . Anxiety   . Shortness of breath     due to asthma  . GERD (gastroesophageal reflux disease)   . Anemia   . History of blood transfusion June 2013    Past Surgical History  Procedure Laterality Date  . Dilation and curettage of uterus    . Cholecystectomy, laparoscopic  1998  . Umbilical hernia repair      as a child  . Cesarean section  1996  . Laparoscopy  at age 3  . Cervical spine surgery  2009  . Back surgery      07/2011  . Lumbar spine surgery    . Laparoscopic tubal ligation   02/12/2012    Procedure: LAPAROSCOPIC TUBAL LIGATION;  Surgeon: Antionette Char, MD;  Location: [facility];  Service: Gynecology;  Laterality: Bilateral;  . Tubal ligation    . Hysteroscopy  04/02/2012    Procedure: HYSTEROSCOPY;  Surgeon: Antionette Char, MD;  Location: [facility];  Service: Gynecology;  Laterality: N/A;  with Dilitation and curretage and attempted hydrothermal ablation    No prescriptions prior to admission   Allergies  Allergen Reactions  . Morphine And Related Anaphylaxis    Pt can take dilaudid  . Aspirin Itching    Pt can tolerate ibuprofen  . Contrast Media (Iodinated Diagnostic Agents) Nausea And Vomiting  . Penicillins Itching  . Tramadol Itching  . Vicodin (Hydrocodone-Acetaminophen) Itching    Pt can take percocet    History  Substance Use Topics  . Smoking status: Former Smoker -- 0.50 packs/day    Types: Cigarettes, Cigars    Start date: 07/09/1995    Quit date: 11/20/2011  . Smokeless tobacco: Never Used     Comment: pt quit cigarettes in 2009, pt smoke 2 black and milds a day until 09-2011  . Alcohol Use: 0.0 oz/week     Comment: Occasionally, "maybe 1-2 times/month"    Family History  Problem Relation Age of Onset  . Hypertension Mother   .  Cancer Maternal Aunt   . Cancer Maternal Uncle   . Diabetes Maternal Grandmother   . Anesthesia problems Neg Hx   . Hypotension Neg Hx   . Malignant hyperthermia Neg Hx   . Pseudochol deficiency Neg Hx   . Stroke Other     aunt  . Sickle cell anemia Other      Review of Systems Pertinent items are noted in HPI.    Objective:   Vital signs in last 24 hours:    General:   alert  Skin:   normal  HEENT:  PERRLA  Lungs:   clear to auscultation bilaterally  Heart:   regular rate and rhythm, S1, S2 normal, no murmur, click, rub or gallop  Breasts:   normal without suspicious masses, skin or nipple changes or axillary nodes  Abdomen:  soft, non-tender; bowel sounds normal; no masses,  no organomegaly   Pelvis:  External genitalia: normal general appearance Vaginal: normal mucosa without prolapse or lesions Cervix: normal appearance Adnexa: non palpable Uterus: anteverted, slightly enlarged--approximately 10 wks      Assessment/Plan: AUB--A  Pre-operative clearance per Pulmonology In light of h/o a PE-->Xarelto for thromboembolic prophylaxis postoperatively Continue inhaled steroid for asthma  I had a lengthy discussion with the patient regarding her bleeding and consideration for  hysterectomy.  Procedure, risks, reasons, benefits and complications (including injury to bowel, bladder, major blood vessel, ureter, bleeding, possibility of transfusion, infection, or fistula formation) were reviewed in detail. Consent was signed and preop testing was ordered.  Instructions were reviewed, including NPO after midnight.

## 2012-10-08 NOTE — Anesthesia Preprocedure Evaluation (Addendum)
Anesthesia Evaluation    Airway Mallampati: III TM Distance: >3 FB Neck ROM: Full    Dental no notable dental hx. (+) Chipped and Teeth Intact,    Pulmonary shortness of breath, asthma , pneumonia -, resolved, former smoker,  Hx/o PTE breath sounds clear to auscultation  Pulmonary exam normal       Cardiovascular hypertension, DVT Rhythm:Regular Rate:Normal     Neuro/Psych  Headaches, Anxiety    GI/Hepatic Neg liver ROS, GERD-  Medicated and Controlled,  Endo/Other  negative endocrine ROS  Renal/GU negative Renal ROS  negative genitourinary   Musculoskeletal negative musculoskeletal ROS (+)   Abdominal   Peds  Hematology Anticoagulated on Coumadin for recent Hx/o PTE.   Anesthesia Other Findings Pierced Tongue  Reproductive/Obstetrics Endometriosis  Menometorrhagia                            Anesthesia Physical Anesthesia Plan  ASA: III  Anesthesia Plan: General   Post-op Pain Management:    Induction: Intravenous  Airway Management Planned: Oral ETT  Additional Equipment:   Intra-op Plan:   Post-operative Plan: Extubation in OR  Informed Consent: I have reviewed the patients History and Physical, chart, labs and discussed the procedure including the risks, benefits and alternatives for the proposed anesthesia with the patient or authorized representative who has indicated his/her understanding and acceptance.   Dental advisory given  Plan Discussed with: CRNA  Anesthesia Plan Comments:         Anesthesia Quick Evaluation

## 2012-10-09 ENCOUNTER — Ambulatory Visit (HOSPITAL_COMMUNITY)
Admission: RE | Admit: 2012-10-09 | Discharge: 2012-10-11 | Disposition: A | Discharge status: Home / Self Care | Payer: Medicaid Other | Source: Ambulatory Visit | Attending: Obstetrics & Gynecology | Admitting: Obstetrics & Gynecology

## 2012-10-09 ENCOUNTER — Encounter (HOSPITAL_COMMUNITY): Payer: Self-pay | Admitting: *Deleted

## 2012-10-09 ENCOUNTER — Encounter (HOSPITAL_COMMUNITY)
Admission: RE | Disposition: A | Discharge status: Home / Self Care | Payer: Self-pay | Source: Ambulatory Visit | Attending: Obstetrics & Gynecology

## 2012-10-09 ENCOUNTER — Ambulatory Visit (HOSPITAL_COMMUNITY): Payer: Medicaid Other | Admitting: Anesthesiology

## 2012-10-09 ENCOUNTER — Encounter (HOSPITAL_COMMUNITY): Payer: Self-pay | Admitting: Anesthesiology

## 2012-10-09 DIAGNOSIS — I10 Essential (primary) hypertension: Secondary | ICD-10-CM | POA: Insufficient documentation

## 2012-10-09 DIAGNOSIS — Z01812 Encounter for preprocedural laboratory examination: Secondary | ICD-10-CM | POA: Insufficient documentation

## 2012-10-09 DIAGNOSIS — Z86711 Personal history of pulmonary embolism: Secondary | ICD-10-CM | POA: Insufficient documentation

## 2012-10-09 DIAGNOSIS — N938 Other specified abnormal uterine and vaginal bleeding: Secondary | ICD-10-CM | POA: Insufficient documentation

## 2012-10-09 DIAGNOSIS — N8000 Endometriosis of the uterus, unspecified: Principal | ICD-10-CM | POA: Insufficient documentation

## 2012-10-09 DIAGNOSIS — N949 Unspecified condition associated with female genital organs and menstrual cycle: Secondary | ICD-10-CM | POA: Insufficient documentation

## 2012-10-09 DIAGNOSIS — D259 Leiomyoma of uterus, unspecified: Secondary | ICD-10-CM

## 2012-10-09 DIAGNOSIS — K219 Gastro-esophageal reflux disease without esophagitis: Secondary | ICD-10-CM | POA: Insufficient documentation

## 2012-10-09 DIAGNOSIS — N926 Irregular menstruation, unspecified: Secondary | ICD-10-CM

## 2012-10-09 DIAGNOSIS — J45909 Unspecified asthma, uncomplicated: Secondary | ICD-10-CM | POA: Insufficient documentation

## 2012-10-09 DIAGNOSIS — F411 Generalized anxiety disorder: Secondary | ICD-10-CM | POA: Insufficient documentation

## 2012-10-09 HISTORY — PX: BILATERAL SALPINGECTOMY: SHX5743

## 2012-10-09 HISTORY — PX: ROBOTIC ASSISTED LAP VAGINAL HYSTERECTOMY: SHX2362

## 2012-10-09 LAB — TYPE AND SCREEN: ABO/RH(D): O POS

## 2012-10-09 LAB — PREGNANCY, URINE: Preg Test, Ur: NEGATIVE

## 2012-10-09 SURGERY — ROBOTIC ASSISTED LAPAROSCOPIC VAGINAL HYSTERECTOMY
Anesthesia: General | Wound class: Clean Contaminated

## 2012-10-09 MED ORDER — ONDANSETRON HCL 4 MG/2ML IJ SOLN: 4.0000 mg | Freq: Four times a day (QID) | INTRAMUSCULAR | Status: DC | PRN

## 2012-10-09 MED ORDER — LIDOCAINE HCL (CARDIAC) 20 MG/ML IV SOLN
INTRAVENOUS | Status: DC | PRN
  Administered 2012-10-09: 100 mg via INTRAVENOUS

## 2012-10-09 MED ORDER — LACTATED RINGERS IV SOLN
INTRAVENOUS | Status: DC | PRN
  Administered 2012-10-09 (×2): via INTRAVENOUS

## 2012-10-09 MED ORDER — KETOROLAC TROMETHAMINE 30 MG/ML IJ SOLN
30.0000 mg | Freq: Four times a day (QID) | INTRAMUSCULAR | Status: DC
  Filled 2012-10-09 (×9): qty 1

## 2012-10-09 MED ORDER — KETOROLAC TROMETHAMINE 30 MG/ML IJ SOLN: 30.0000 mg | Freq: Once | INTRAMUSCULAR | Status: DC

## 2012-10-09 MED ORDER — DEXTROSE-NACL 5-0.45 % IV SOLN
INTRAVENOUS | Status: DC
Start: 2012-10-09 — End: 2012-10-11
  Administered 2012-10-09 – 2012-10-11 (×3): via INTRAVENOUS

## 2012-10-09 MED ORDER — METRONIDAZOLE IN NACL 5-0.79 MG/ML-% IV SOLN
INTRAVENOUS | Status: AC
  Filled 2012-10-09: qty 100

## 2012-10-09 MED ORDER — MAGNESIUM HYDROXIDE 400 MG/5ML PO SUSP
30.0000 mL | Freq: Two times a day (BID) | ORAL | Status: AC
  Administered 2012-10-09 – 2012-10-10 (×3): 30 mL via ORAL
  Filled 2012-10-09 (×4): qty 30

## 2012-10-09 MED ORDER — PROMETHAZINE HCL 25 MG/ML IJ SOLN
INTRAMUSCULAR | Status: DC | PRN
  Administered 2012-10-09: 5 mg via INTRAVENOUS

## 2012-10-09 MED ORDER — MEPERIDINE HCL 25 MG/ML IJ SOLN
INTRAMUSCULAR | Status: DC | PRN
  Administered 2012-10-09: 25 mg via INTRAVENOUS

## 2012-10-09 MED ORDER — BUPIVACAINE HCL 0.25 % IJ SOLN
INTRAMUSCULAR | Status: DC | PRN
  Administered 2012-10-09: 7 mL

## 2012-10-09 MED ORDER — LACTATED RINGERS IR SOLN
Status: DC | PRN
  Administered 2012-10-09: 1000 mL

## 2012-10-09 MED ORDER — PROPOFOL 10 MG/ML IV BOLUS
INTRAVENOUS | Status: DC | PRN
  Administered 2012-10-09: 150 mg via INTRAVENOUS

## 2012-10-09 MED ORDER — MENTHOL 3 MG MT LOZG
1.0000 | LOZENGE | OROMUCOSAL | Status: DC | PRN
  Filled 2012-10-09: qty 9

## 2012-10-09 MED ORDER — HYDROMORPHONE HCL PF 1 MG/ML IJ SOLN
INTRAMUSCULAR | Status: DC | PRN
  Administered 2012-10-09 (×5): .4 mg via INTRAVENOUS

## 2012-10-09 MED ORDER — BUPIVACAINE HCL (PF) 0.25 % IJ SOLN
INTRAMUSCULAR | Status: AC
  Filled 2012-10-09: qty 30

## 2012-10-09 MED ORDER — PROMETHAZINE HCL 25 MG/ML IJ SOLN
INTRAMUSCULAR | Status: AC
  Filled 2012-10-09: qty 1

## 2012-10-09 MED ORDER — PANTOPRAZOLE SODIUM 40 MG PO TBEC
40.0000 mg | DELAYED_RELEASE_TABLET | Freq: Every day | ORAL | Status: DC
  Administered 2012-10-09 – 2012-10-11 (×3): 40 mg via ORAL
  Filled 2012-10-09 (×3): qty 1

## 2012-10-09 MED ORDER — DEXAMETHASONE SODIUM PHOSPHATE 10 MG/ML IJ SOLN
INTRAMUSCULAR | Status: DC | PRN
  Administered 2012-10-09: 8 mg via INTRAVENOUS

## 2012-10-09 MED ORDER — CISATRACURIUM BESYLATE (PF) 10 MG/5ML IV SOLN
INTRAVENOUS | Status: DC | PRN
  Administered 2012-10-09: 10 mg via INTRAVENOUS
  Administered 2012-10-09: 2 mg via INTRAVENOUS

## 2012-10-09 MED ORDER — MEPERIDINE HCL 50 MG/ML IJ SOLN
INTRAMUSCULAR | Status: AC
  Filled 2012-10-09: qty 1

## 2012-10-09 MED ORDER — STERILE WATER FOR IRRIGATION IR SOLN
Status: DC | PRN
  Administered 2012-10-09: 1500 mL

## 2012-10-09 MED ORDER — ZOLPIDEM TARTRATE 5 MG PO TABS
5.0000 mg | ORAL_TABLET | Freq: Every evening | ORAL | Status: DC | PRN
  Administered 2012-10-09 – 2012-10-10 (×2): 5 mg via ORAL
  Filled 2012-10-09 (×2): qty 1

## 2012-10-09 MED ORDER — BUTORPHANOL TARTRATE 1 MG/ML IJ SOLN: 1.0000 mg | INTRAMUSCULAR | Status: DC | PRN

## 2012-10-09 MED ORDER — ONDANSETRON HCL 4 MG/2ML IJ SOLN
INTRAMUSCULAR | Status: DC | PRN
  Administered 2012-10-09: 4 mg via INTRAVENOUS

## 2012-10-09 MED ORDER — LACTATED RINGERS IV SOLN: INTRAVENOUS | Status: DC

## 2012-10-09 MED ORDER — IBUPROFEN 800 MG PO TABS: 800.0000 mg | ORAL_TABLET | Freq: Three times a day (TID) | ORAL | Status: DC | PRN

## 2012-10-09 MED ORDER — DIPHENHYDRAMINE HCL 50 MG/ML IJ SOLN
INTRAMUSCULAR | Status: AC
  Filled 2012-10-09: qty 1

## 2012-10-09 MED ORDER — SUCCINYLCHOLINE CHLORIDE 20 MG/ML IJ SOLN
INTRAMUSCULAR | Status: DC | PRN
  Administered 2012-10-09: 80 mg via INTRAVENOUS

## 2012-10-09 MED ORDER — FENTANYL CITRATE 0.05 MG/ML IJ SOLN
INTRAMUSCULAR | Status: AC
  Filled 2012-10-09: qty 2

## 2012-10-09 MED ORDER — DIPHENHYDRAMINE HCL 50 MG/ML IJ SOLN
25.0000 mg | Freq: Once | INTRAMUSCULAR | Status: AC
  Administered 2012-10-09: 25 mg via INTRAVENOUS

## 2012-10-09 MED ORDER — NEOSTIGMINE METHYLSULFATE 1 MG/ML IJ SOLN
INTRAMUSCULAR | Status: DC | PRN
  Administered 2012-10-09: 4 mg via INTRAVENOUS

## 2012-10-09 MED ORDER — ACETAMINOPHEN 10 MG/ML IV SOLN
INTRAVENOUS | Status: AC
  Filled 2012-10-09: qty 100

## 2012-10-09 MED ORDER — CEFAZOLIN SODIUM-DEXTROSE 2-3 GM-% IV SOLR
2.0000 g | Freq: Once | INTRAVENOUS | Status: AC
  Administered 2012-10-09: 2 g via INTRAVENOUS

## 2012-10-09 MED ORDER — GLYCOPYRROLATE 0.2 MG/ML IJ SOLN
INTRAMUSCULAR | Status: DC | PRN
  Administered 2012-10-09: .6 mg via INTRAVENOUS

## 2012-10-09 MED ORDER — PROMETHAZINE HCL 25 MG/ML IJ SOLN: 6.2500 mg | INTRAMUSCULAR | Status: DC | PRN

## 2012-10-09 MED ORDER — SUFENTANIL CITRATE 50 MCG/ML IV SOLN
INTRAVENOUS | Status: DC | PRN
  Administered 2012-10-09 (×5): 10 ug via INTRAVENOUS

## 2012-10-09 MED ORDER — ACETAMINOPHEN 10 MG/ML IV SOLN
INTRAVENOUS | Status: DC | PRN
  Administered 2012-10-09: 1000 mg via INTRAVENOUS

## 2012-10-09 MED ORDER — METRONIDAZOLE IN NACL 5-0.79 MG/ML-% IV SOLN
500.0000 mg | Freq: Once | INTRAVENOUS | Status: AC
  Administered 2012-10-09: 500 mg via INTRAVENOUS

## 2012-10-09 MED ORDER — CEFAZOLIN SODIUM-DEXTROSE 2-3 GM-% IV SOLR
INTRAVENOUS | Status: AC
  Filled 2012-10-09: qty 50

## 2012-10-09 MED ORDER — MIDAZOLAM HCL 5 MG/5ML IJ SOLN
INTRAMUSCULAR | Status: DC | PRN
  Administered 2012-10-09 (×2): 1 mg via INTRAVENOUS

## 2012-10-09 MED ORDER — FENTANYL CITRATE 0.05 MG/ML IJ SOLN
25.0000 ug | INTRAMUSCULAR | Status: DC | PRN
  Administered 2012-10-09: 50 ug via INTRAVENOUS

## 2012-10-09 MED ORDER — KETOROLAC TROMETHAMINE 30 MG/ML IJ SOLN
30.0000 mg | Freq: Four times a day (QID) | INTRAMUSCULAR | Status: DC
  Administered 2012-10-09 – 2012-10-11 (×9): 30 mg via INTRAVENOUS
  Filled 2012-10-09 (×14): qty 1

## 2012-10-09 MED ORDER — ONDANSETRON HCL 4 MG PO TABS: 4.0000 mg | ORAL_TABLET | Freq: Four times a day (QID) | ORAL | Status: DC | PRN

## 2012-10-09 MED ORDER — OXYCODONE-ACETAMINOPHEN 5-325 MG PO TABS
1.0000 | ORAL_TABLET | ORAL | Status: DC | PRN
  Administered 2012-10-09 – 2012-10-11 (×5): 2 via ORAL
  Filled 2012-10-09 (×6): qty 2

## 2012-10-09 SURGICAL SUPPLY — 44 items
ADH SKN CLS APL DERMABOND .7 (GAUZE/BANDAGES/DRESSINGS) ×4
CHLORAPREP W/TINT 26ML (MISCELLANEOUS) ×3 IMPLANT
CLOTH BEACON ORANGE TIMEOUT ST (SAFETY) ×3 IMPLANT
CORDS BIPOLAR (ELECTRODE) ×3 IMPLANT
COVER SURGICAL LIGHT HANDLE (MISCELLANEOUS) ×3 IMPLANT
COVER TIP SHEARS 8 DVNC (MISCELLANEOUS) ×2 IMPLANT
COVER TIP SHEARS 8MM DA VINCI (MISCELLANEOUS) ×1
DECANTER SPIKE VIAL GLASS SM (MISCELLANEOUS) ×2 IMPLANT
DERMABOND ADVANCED (GAUZE/BANDAGES/DRESSINGS) ×2
DERMABOND ADVANCED .7 DNX12 (GAUZE/BANDAGES/DRESSINGS) ×2 IMPLANT
DRAPE SURG IRRIG POUCH 19X23 (DRAPES) ×3 IMPLANT
DRSG TEGADERM 6X8 (GAUZE/BANDAGES/DRESSINGS) ×6 IMPLANT
ELECT REM PT RETURN 9FT ADLT (ELECTROSURGICAL) ×3
ELECTRODE REM PT RTRN 9FT ADLT (ELECTROSURGICAL) ×2 IMPLANT
FILTER SMOKE EVAC LAPAROSHD (FILTER) IMPLANT
GAUZE VASELINE 3X9 (GAUZE/BANDAGES/DRESSINGS) IMPLANT
GLOVE BIO SURGEON STRL SZ 6.5 (GLOVE) ×9 IMPLANT
GLOVE BIO SURGEON STRL SZ8 (GLOVE) ×3 IMPLANT
GOWN PREVENTION PLUS LG XLONG (DISPOSABLE) ×3 IMPLANT
GOWN PREVENTION PLUS XXLARGE (GOWN DISPOSABLE) ×3 IMPLANT
KIT ACCESSORY DA VINCI DISP (KITS) ×1
KIT ACCESSORY DVNC DISP (KITS) ×2 IMPLANT
MANIPULATOR UTERINE 4.5 ZUMI (MISCELLANEOUS) IMPLANT
OCCLUDER COLPOPNEUMO (BALLOONS) ×3 IMPLANT
PACK LAPAROSCOPY W LONG (CUSTOM PROCEDURE TRAY) ×3 IMPLANT
PENCIL BUTTON HOLSTER BLD 10FT (ELECTRODE) ×1 IMPLANT
SET TUBE IRRIG SUCTION NO TIP (IRRIGATION / IRRIGATOR) ×3 IMPLANT
SOLUTION ELECTROLUBE (MISCELLANEOUS) ×3 IMPLANT
SPONGE LAP 18X18 X RAY DECT (DISPOSABLE) IMPLANT
SUT MNCRL AB 4-0 PS2 18 (SUTURE) ×5 IMPLANT
SUT VIC AB 0 CT1 27 (SUTURE) ×3
SUT VIC AB 0 CT1 27XBRD ANTBC (SUTURE) ×6 IMPLANT
SUT VICRYL 0 UR6 27IN ABS (SUTURE) ×6 IMPLANT
SYR 50ML LL SCALE MARK (SYRINGE) ×1 IMPLANT
SYR BULB IRRIGATION 50ML (SYRINGE) ×3 IMPLANT
SYR CONTROL 10ML LL (SYRINGE) ×6 IMPLANT
TOWEL OR 17X26 10 PK STRL BLUE (TOWEL DISPOSABLE) ×3 IMPLANT
TOWEL OR NON WOVEN STRL DISP B (DISPOSABLE) ×3 IMPLANT
TRAY FOLEY CATH 14FRSI W/METER (CATHETERS) ×3 IMPLANT
TROCAR 12M 150ML BLUNT (TROCAR) ×3 IMPLANT
TROCAR BLADELESS OPT 5 100 (ENDOMECHANICALS) ×3 IMPLANT
TROCAR BLADELESS OPT 5 75 (ENDOMECHANICALS) ×3 IMPLANT
TROCAR XCEL 12X100 BLDLESS (ENDOMECHANICALS) ×4 IMPLANT
WATER STERILE IRR 1500ML POUR (IV SOLUTION) ×6 IMPLANT

## 2012-10-09 NOTE — Anesthesia Procedure Notes (Signed)
Procedure Name: Intubation Date/Time: 10/09/2012 7:43 AM Performed by: Leroy Libman L Patient Re-evaluated:Patient Re-evaluated prior to inductionOxygen Delivery Method: Circle system utilized Preoxygenation: Pre-oxygenation with 100% oxygen Intubation Type: IV induction Ventilation: Mask ventilation without difficulty and Oral airway inserted - appropriate to patient size Laryngoscope Size: Hyacinth Meeker and 2 Grade View: Grade II Tube type: Oral Tube size: 7.5 mm Number of attempts: 1 Airway Equipment and Method: Stylet Placement Confirmation: ETT inserted through vocal cords under direct vision,  positive ETCO2 and breath sounds checked- equal and bilateral Secured at: 21 cm Tube secured with: Tape Dental Injury: Teeth and Oropharynx as per pre-operative assessment

## 2012-10-09 NOTE — Op Note (Addendum)
Pre-operative Diagnosis: abnormal uterine bleeding  Post-operative Diagnosis: abnormal uterine bleeding  Operation: Robotic-assisted hysterectomy with bilateral salpingectomy  Surgeon: [name]  Assistant: Coral Ceo, MD  Anesthesia: GET  Urine Output: Per anesthesiology  Findings: There were adhesions involving the omentum to the parietoperitoneum of the anterior abdominal wall inferior to the umbilicus. The uterus appears globular. Mid isthmic portions of the fallopian tubes are attenuated consistent with her prior tubal ligation. There is some scarring of the anterior cul-de-sac-- the patient has a history of a prior cesarean delivery. The ovaries are normal.  Estimated Blood Loss:  Minimal                 Total IV Fluids:  Per anesthesiology         Specimens: PATHOLOGY               Complications:  None; patient tolerated the procedure well.         Disposition: PACU - hemodynamically stable.         Condition: Stable    Procedure Details  The patient was seen in the Holding Room. The risks, benefits, complications, treatment options, and expected outcomes were discussed with the patient.  The patient concurred with the proposed plan, giving informed consent.  The site of surgery properly noted/marked. The patient was identified as Megan Chandler and the procedure verified as a Robotic-assisted hysterectomy with bilateral salpingectomy. A Time Out was held and the above information confirmed.  After induction of anesthesia, the patient was draped and prepped in the usual sterile manner. Pt was placed in supine position after anesthesia and draped and prepped in the usual sterile manner. The abdominal drape was placed after the CholoraPrep had been allowed to dry for 3 minutes.  Her arms were tucked to her side with all appropriate precautions.  The shoulder blocks were placed in the usual fashion.  The patient was placed in the semi-lithotomy position in  [place] stirrups.  The perineum was prepped with Betadine.  Foley catheter was placed.  A sterile speculum was placed in the vagina.  The cervix was grasped with a single-tooth tenaculum and dilated with Shawnie Pons dilators.  The ZUMI uterine manipulator with a medium colpotomizer ring was placed without difficulty.  A pneum occluder balloon was placed over the manipulator.  A second time-out was performed.  OG tube placement was confirmed and to suction.  Approximately 2 cm below the costal margin, in the midclavicular line the skin was anesthestized with 0.25% Marcaine.  A 5 mm incision was made and using a 5 mm Optiview, a 5 mm trocar was placed under direct vision.  The patient's abdomen was insufflated with CO2 gas.  At this point and all points during the procedure, the patient's intra-abdominal pressure did not exceed 15 mmHg.  A 10-12 camera port was place 24 cm above the pubic symphysis.  Bilateral 8 mm ports were place 10 cm and 15 degrees inferior.  All ports were placed under direct visualization.  The above noted all mental motions are divided using monopolar cautery and an Endo Shears. The 5 mm port was removed.  The incision was extended to accommodate a 10 mm trocar.  A 10 mm trocar and sleeve were advanced under direct visualization.   The robot was docked in the usual fashion.  The round ligament on the right was transected with monopolar cautery.  The anterior and posterior leaves of the broad ligament were opened.  The ureter was identified.  A window was made between the infundibulopelvic ligament and the ureter.  The right utero-ovarian ligament and proximal fallopian tube were was coagulated with bipolar cautery and transected.  The uterine vessels were skeletonized to the level of the Koh ring. The uterine vessels were coagulated with bipolar cautery and transected.   A C-loop was created in the usual fashion.  A similar procedure was performed on the patient's left side. The round ligament on the  left was transected with monopolar cautery.  The anterior and posterior leaves of the broad ligament were opened.  The ureter was identified.  A window was made between the infundibulopelvic ligament and the ureter.  The left utero-ovarian ligament and proximal fallopian tube were was coagulated with bipolar cautery and transected.  The uterine vessels were skeletonized to the level of the Koh ring. The uterine vessels were coagulated with bipolar cautery and transected.  A C-loop was created in the usual fashion.   The bladder flap was completed.  At this time, the pneum occluder balloon was insufflated and a colpotomy was performed.  The uterus and cervix were delivered through the vagina.  The left fallopian tube was grasped. The mesosalpinx was coagulated with bipolar cautery and transected. The fallopian tube was removed from the abdomen. The right fallopian tube was manipulated in a similar fashion. All pedicles were inspected under low intraabdominal pressures and noted to be hemostatic.  The vagina cuff was closed using 0-Vicryl on a CT-1 needle in a running fashion.  The abdomen and pelvis were copiously irrigated.  The needle was removed without difficulty.  The instruments were then removed under direct visualization and the robotic ports removed.  The robot was undocked.  Deep, subcutaneous, figure-of-eight 0-Vicryl sutures on a UR-6 needle were placed in the 10-12 mm supraumbilical and infracostal incisions.  All skin incisions were closed in a subcuticular fashion using 3-0 Monocryl.  Steri-strips and Benzoin were applied.  The vagina was swabbed with minimal bleeding noted.   All instrument and needle counts were correct x  2.

## 2012-10-09 NOTE — Progress Notes (Signed)
Patient Megan Chandler has not voided contacted Dr Tamela Oddi ordered for a foley catheter to be inserted. Lurena Joiner, RN

## 2012-10-09 NOTE — Transfer of Care (Signed)
Immediate Anesthesia Transfer of Care Note  Patient: Megan Chandler  Procedure(s) Performed: Procedure(s): ROBOTIC ASSISTED LAPAROSCOPIC VAGINAL HYSTERECTOMY (N/A) BILATERAL SALPINGECTOMY (Bilateral)  Patient Location: PACU  Anesthesia Type:General  Level of Consciousness: awake, alert  and oriented  Airway & Oxygen Therapy: Patient Spontanous Breathing and Patient connected to nasal cannula oxygen  Post-op Assessment: Report given to PACU RN and Post -op Vital signs reviewed and stable  Post vital signs: Reviewed and stable  Complications: No apparent anesthesia complications

## 2012-10-09 NOTE — Anesthesia Postprocedure Evaluation (Signed)
Anesthesia Post Note  Patient: Megan Chandler  Procedure(s) Performed: Procedure(s) (LRB): ROBOTIC ASSISTED LAPAROSCOPIC VAGINAL HYSTERECTOMY (N/A) BILATERAL SALPINGECTOMY (Bilateral)  Anesthesia type: General  Patient location: PACU  Post pain: Pain level controlled  Post assessment: Post-op Vital signs reviewed  Last Vitals:  Filed Vitals:   10/09/12 1200  BP: 115/64  Pulse: 58  Temp: 36.7 C  Resp: 18    Post vital signs: Reviewed  Level of consciousness: sedated  Complications: No apparent anesthesia complications

## 2012-10-09 NOTE — Interval H&P Note (Signed)
History and Physical Interval Note:  10/09/2012 7:10 AM  Megan Chandler  has presented today for surgery, with the diagnosis of Abnormal uterine bleeding/ pelvic pain  The various methods of treatment have been discussed with the patient and family. After consideration of risks, benefits and other options for treatment, the patient has consented to  Procedure(s): ROBOTIC ASSISTED LAPAROSCOPIC VAGINAL HYSTERECTOMY (N/A) BILATERAL SALPINGECTOMY (Bilateral) as a surgical intervention .  The patient's history has been reviewed, patient examined, no change in status, stable for surgery.  I have reviewed the patient's chart and labs.  Questions were answered to the patient's satisfaction.     JACKSON-MOORE,[name]

## 2012-10-09 NOTE — Preoperative (Signed)
Beta Blockers   Reason not to administer Beta Blockers:Not Applicable 

## 2012-10-10 LAB — COMPREHENSIVE METABOLIC PANEL
Albumin: 3.1 g/dL — ABNORMAL LOW (ref 3.5–5.2)
Alkaline Phosphatase: 76 U/L (ref 39–117)
BUN: 6 mg/dL (ref 6–23)
CO2: 24 mEq/L (ref 19–32)
Chloride: 104 mEq/L (ref 96–112)
Creatinine, Ser: 0.68 mg/dL (ref 0.50–1.10)
GFR calc non Af Amer: >90 mL/min (ref >90)
Potassium: 3.2 mEq/L — ABNORMAL LOW (ref 3.5–5.1)
Total Bilirubin: 0.2 mg/dL — ABNORMAL LOW (ref 0.3–1.2)

## 2012-10-10 LAB — CBC
HCT: 28.4 % — ABNORMAL LOW (ref 36.0–46.0)
Hemoglobin: 9 g/dL — ABNORMAL LOW (ref 12.0–15.0)
MCV: 78.2 fL (ref 78.0–100.0)
RBC: 3.63 MIL/uL — ABNORMAL LOW (ref 3.87–5.11)
RDW: 17.9 % — ABNORMAL HIGH (ref 11.5–15.5)
WBC: 13.1 10*3/uL — ABNORMAL HIGH (ref 4.0–10.5)

## 2012-10-10 MED ORDER — BUTORPHANOL TARTRATE 2 MG/ML IJ SOLN
1.0000 mg | INTRAMUSCULAR | Status: DC | PRN
  Administered 2012-10-10 (×3): 2 mg via INTRAVENOUS
  Filled 2012-10-10 (×3): qty 1

## 2012-10-10 NOTE — Progress Notes (Signed)
Patient complaint of not being able to sleep has been up since surgery and wants to rest. Gave patient 2 percocet and ambien and patient has not been to sleep as of yet. Fraya Ueda RN

## 2012-10-10 NOTE — Progress Notes (Signed)
1 Day Post-Op Procedure(s) (LRB): ROBOTIC ASSISTED LAPAROSCOPIC VAGINAL HYSTERECTOMY (N/A) BILATERAL SALPINGECTOMY (Bilateral)  Subjective: Patient reports incisional pain.    Objective: I have reviewed patient's vital signs, intake and output and medications.  General: alert and no distress GI: normal findings: soft, non-tender Extremities: extremities normal, atraumatic, no cyanosis or edema Vaginal Bleeding: none  Assessment: s/p Procedure(s): ROBOTIC ASSISTED LAPAROSCOPIC VAGINAL HYSTERECTOMY (N/A) BILATERAL SALPINGECTOMY (Bilateral): stable and not tolerating diet  Plan: Advance diet Encourage ambulation  LOS: 1 day    Mega Kinkade A 10/10/2012, 5:00 PM

## 2012-10-11 MED ORDER — OXYCODONE-ACETAMINOPHEN 5-325 MG PO TABS: 1.0000 | ORAL_TABLET | ORAL | Status: DC | PRN

## 2012-10-11 MED ORDER — IBUPROFEN 800 MG PO TABS: 800.0000 mg | ORAL_TABLET | Freq: Three times a day (TID) | ORAL | Status: DC | PRN

## 2012-10-11 NOTE — Progress Notes (Signed)
2 Days Post-Op Procedure(s) (LRB): ROBOTIC ASSISTED LAPAROSCOPIC VAGINAL HYSTERECTOMY (N/A) BILATERAL SALPINGECTOMY (Bilateral)  Subjective: Patient reports tolerating PO, + flatus and no problems voiding.    Objective: I have reviewed patient's vital signs, intake and output, medications and labs.  Abdomen:  Soft, nontender.  Incision clean, dry and intact.  Assessment: s/p Procedure(s): ROBOTIC ASSISTED LAPAROSCOPIC VAGINAL HYSTERECTOMY (N/A) BILATERAL SALPINGECTOMY (Bilateral): stable, progressing well and tolerating diet  Plan: Advance diet Discharge home  LOS: 2 days    Elisabeth Strom A 10/11/2012, 9:14 AM

## 2012-10-11 NOTE — Discharge Summary (Signed)
Physician Discharge Summary  Patient ID: Megan Chandler MRN: [id number] DOB/AGE: 1976-01-19 37 y.o.  Admit date: 10/09/2012 Discharge date: 10/11/2012  Admission Diagnoses:  Symptomatic uterine fibroids  Discharge Diagnoses: Same Active Problems:   * No active hospital problems. *   Discharged Condition: good  Hospital Course: Underwent Robotic assisted TLH and Bilateral Salpingectomy.  Consults: None  Significant Diagnostic Studies: labs: CBC, CMET  Treatments: IV hydration and analgesia: Percocet and Ibuprofen  Discharge Exam: Blood pressure 128/78, pulse 75, temperature 98.7 F (37.1 C), temperature source Oral, resp. rate 18, height 5\' 6"  (1.676 m), weight 164 lb (74.39 kg), SpO2 99.00%. General appearance: alert and no distress GI: normal findings: soft, non-tender and Incisions clean, dry and intact.  Disposition: 01-Home or Self Care  Discharge Orders   Future Orders Complete By Expires     (HEART FAILURE PATIENTS) Call MD:  Anytime you have any of the following symptoms: 1) 3 pound weight gain in 24 hours or 5 pounds in 1 week 2) shortness of breath, with or without a dry hacking cough 3) swelling in the hands, feet or stomach 4) if you have to sleep on extra pillows at night in order to breathe.  As directed     Call MD for:  difficulty breathing, headache or visual disturbances  As directed     Call MD for:  extreme fatigue  As directed     Call MD for:  hives  As directed     Call MD for:  persistant dizziness or light-headedness  As directed     Call MD for:  persistant nausea and vomiting  As directed     Call MD for:  redness, tenderness, or signs of infection (pain, swelling, redness, odor or green/yellow discharge around incision site)  As directed     Call MD for:  severe uncontrolled pain  As directed     Call MD for:  temperature >100.4  As directed     Call MD for:  As directed     Diet - low sodium heart healthy  As directed     Discharge instructions   As directed     Comments:      Routine    Driving Restrictions  As directed     Comments:      No driving for 2 weeks.    Increase activity slowly  As directed     Lifting restrictions  As directed     Comments:      No lifting > 20 lbs.    Sexual Activity Restrictions  As directed     Comments:      No sex for 6 weeks.        Medication List    STOP taking these medications       medroxyPROGESTERone 10 MG tablet  Commonly known as:  PROVERA      TAKE these medications       albuterol 108 (90 BASE) MCG/ACT inhaler  Commonly known as:  PROVENTIL HFA;VENTOLIN HFA  Inhale 2 puffs into the lungs every 4 (four) hours as needed for shortness of breath.     budesonide-formoterol 80-4.5 MCG/ACT inhaler  Commonly known as:  SYMBICORT  Inhale 2 puffs into the lungs 2 (two) times daily.     famotidine 20 MG tablet  Commonly known as:  PEPCID  Take 20 mg by mouth 2 (two) times daily as needed for heartburn.     ferrous sulfate 325 (65 FE) MG  tablet  Take 325 mg by mouth daily.     ibuprofen 800 MG tablet  Commonly known as:  ADVIL,MOTRIN  Take 1 tablet (800 mg total) by mouth every 8 (eight) hours as needed for pain (mild pain).  Start taking on:  10/14/2012     oxyCODONE-acetaminophen 5-325 MG per tablet  Commonly known as:  PERCOCET/ROXICET  Take 1-2 tablets by mouth every 4 (four) hours as needed.     traMADol 50 MG tablet  Commonly known as:  ULTRAM  Take 1 tablet (50 mg total) by mouth every 6 (six) hours as needed for pain.           Follow-up Information   Follow up with Antionette Char A, MD. Schedule an appointment as soon as possible for a visit in 2 weeks.   Contact information:   [address] [place] [address] [phone]       Signed: Brock Bad 10/11/2012, 9:25 AM

## 2012-10-11 NOTE — Progress Notes (Signed)
Assessment unchanged. Pt verbalized understanding of dc instructions as well as My Chart introduction. Script x 1 given as provided by MD. Verbalized when to follow up with MD post dc and how to contact if needed. Discharged via wc to front entrance to meet awaiting vehicle to carry home. Accompanied by NT.

## 2012-10-12 ENCOUNTER — Emergency Department (HOSPITAL_COMMUNITY)
Admission: EM | Admit: 2012-10-12 | Discharge: 2012-10-12 | Disposition: A | Discharge status: Home / Self Care | Payer: Medicaid Other | Attending: Emergency Medicine | Admitting: Emergency Medicine

## 2012-10-12 ENCOUNTER — Encounter (HOSPITAL_COMMUNITY): Payer: Self-pay | Admitting: Obstetrics & Gynecology

## 2012-10-12 DIAGNOSIS — Z8742 Personal history of other diseases of the female genital tract: Secondary | ICD-10-CM | POA: Insufficient documentation

## 2012-10-12 DIAGNOSIS — Z87440 Personal history of urinary (tract) infections: Secondary | ICD-10-CM | POA: Insufficient documentation

## 2012-10-12 DIAGNOSIS — Z86711 Personal history of pulmonary embolism: Secondary | ICD-10-CM | POA: Insufficient documentation

## 2012-10-12 DIAGNOSIS — K219 Gastro-esophageal reflux disease without esophagitis: Secondary | ICD-10-CM | POA: Insufficient documentation

## 2012-10-12 DIAGNOSIS — Z79899 Other long term (current) drug therapy: Secondary | ICD-10-CM | POA: Insufficient documentation

## 2012-10-12 DIAGNOSIS — Z8739 Personal history of other diseases of the musculoskeletal system and connective tissue: Secondary | ICD-10-CM | POA: Insufficient documentation

## 2012-10-12 DIAGNOSIS — Z8709 Personal history of other diseases of the respiratory system: Secondary | ICD-10-CM | POA: Insufficient documentation

## 2012-10-12 DIAGNOSIS — Z87448 Personal history of other diseases of urinary system: Secondary | ICD-10-CM | POA: Insufficient documentation

## 2012-10-12 DIAGNOSIS — Y838 Other surgical procedures as the cause of abnormal reaction of the patient, or of later complication, without mention of misadventure at the time of the procedure: Secondary | ICD-10-CM | POA: Insufficient documentation

## 2012-10-12 DIAGNOSIS — Z862 Personal history of diseases of the blood and blood-forming organs and certain disorders involving the immune mechanism: Secondary | ICD-10-CM | POA: Insufficient documentation

## 2012-10-12 DIAGNOSIS — Z9089 Acquired absence of other organs: Secondary | ICD-10-CM | POA: Insufficient documentation

## 2012-10-12 DIAGNOSIS — Z9851 Tubal ligation status: Secondary | ICD-10-CM | POA: Insufficient documentation

## 2012-10-12 DIAGNOSIS — I10 Essential (primary) hypertension: Secondary | ICD-10-CM | POA: Insufficient documentation

## 2012-10-12 DIAGNOSIS — T8131XA Disruption of external operation (surgical) wound, not elsewhere classified, initial encounter: Secondary | ICD-10-CM

## 2012-10-12 DIAGNOSIS — Z90710 Acquired absence of both cervix and uterus: Secondary | ICD-10-CM | POA: Insufficient documentation

## 2012-10-12 DIAGNOSIS — G8929 Other chronic pain: Secondary | ICD-10-CM | POA: Insufficient documentation

## 2012-10-12 DIAGNOSIS — J45909 Unspecified asthma, uncomplicated: Secondary | ICD-10-CM | POA: Insufficient documentation

## 2012-10-12 DIAGNOSIS — Z87891 Personal history of nicotine dependence: Secondary | ICD-10-CM | POA: Insufficient documentation

## 2012-10-12 MED ORDER — ONDANSETRON 4 MG PO TBDP: 4.0000 mg | ORAL_TABLET | Freq: Three times a day (TID) | ORAL | Status: DC | PRN

## 2012-10-12 NOTE — ED Notes (Signed)
Patient is s/p hysterectomy from 10/09/12. Presents with c/o surgical incision problems. States that the dermabond came off of the RLQ lap site. Site is clean and dry. No drainage. No odor noted. Patient also has lap sites to LUQ, LLQ with steri strips, and umbilical area with dermabond. No fevers, sweats or chills.

## 2012-10-12 NOTE — ED Notes (Signed)
Megan Chandler said she had a hysterectomy and one of her incisions dehisced.  The Megan Chandler said she called Dr. Christell Constant in the [facility] clinic and they told her to come to the office tomorrow but she told them she was on her way to the ED.  The Megan Chandler is hurting and is concerned.

## 2012-10-12 NOTE — ED Provider Notes (Signed)
History  This chart was scribed for non-physician practitioner working with Glynn Octave, MD by Erskine Emery, ED Scribe. This patient was seen in room TR06C/TR06C and the patient's care was started at 19:32.   CSN: [id number]  Arrival date & time 10/12/12  1932   First MD Initiated Contact with Patient 10/12/12 1954      Chief Complaint  Patient presents with  . Post-op Problem    (Consider location/radiation/quality/duration/timing/severity/associated sxs/prior treatment) The history is provided by the patient. No language interpreter was used.  Megan Chandler is a 37 y.o. female who presents to the Emergency Department complaining of her dermabond laceration repair falling off her RLQ surgical site from her recent hysterectomy, 10/09/12 (3 days ago). Pt denies any drainage, odor, fever, diaphoresis, or chills. She reports residual abdominal soreness but she denies any other pains or uncommon surgical complications. Mild nausea from taking increased amount of pain medications.  She has additional surgical lap sites to the LUQ, LLQ, and umbilical area that have been repaired with steri strips and dermabond.  Past Medical History  Diagnosis Date  . Urinary tract infection   . Chronic low back pain     buldging disc  . Endometriosis   . Bronchitis   . Headache     often but no migraines  . Arthritis   . History of bladder infections   . Bartholin's cyst     hx of  . Pulmonary embolism     11/2011/ states no since  anticoagulation 1/14  . Menometrorrhagia   . Adenomyosis   . Hypertension     but not on medication  . Asthma     daily inhaler use  . Anxiety   . Shortness of breath     due to asthma  . GERD (gastroesophageal reflux disease)   . Anemia   . History of blood transfusion June 2013    Past Surgical History  Procedure Laterality Date  . Dilation and curettage of uterus    . Cholecystectomy, laparoscopic  1998  . Umbilical hernia repair      as a child  .  Cesarean section  1996  . Laparoscopy  at age 50  . Cervical spine surgery  2009  . Back surgery      07/2011  . Lumbar spine surgery    . Laparoscopic tubal ligation  02/12/2012    Procedure: LAPAROSCOPIC TUBAL LIGATION;  Surgeon: Antionette Char, MD;  Location: [facility];  Service: Gynecology;  Laterality: Bilateral;  . Tubal ligation    . Hysteroscopy  04/02/2012    Procedure: HYSTEROSCOPY;  Surgeon: Antionette Char, MD;  Location: [facility];  Service: Gynecology;  Laterality: N/A;  with Dilitation and curretage and attempted hydrothermal ablation  . Robotic assisted lap vaginal hysterectomy N/A 10/09/2012    Procedure: ROBOTIC ASSISTED LAPAROSCOPIC VAGINAL HYSTERECTOMY;  Surgeon: Antionette Char, MD;  Location: [facility];  Service: Gynecology;  Laterality: N/A;  . Bilateral salpingectomy Bilateral 10/09/2012    Procedure: BILATERAL SALPINGECTOMY;  Surgeon: Antionette Char, MD;  Location: [facility];  Service: Gynecology;  Laterality: Bilateral;  . Abdominal hysterectomy      Family History  Problem Relation Age of Onset  . Hypertension Mother   . Cancer Maternal Aunt   . Cancer Maternal Uncle   . Diabetes Maternal Grandmother   . Anesthesia problems Neg Hx   . Hypotension Neg Hx   . Malignant hyperthermia Neg Hx   . Pseudochol deficiency Neg Hx   . Stroke  Other     aunt  . Sickle cell anemia Other     History  Substance Use Topics  . Smoking status: Former Smoker -- 0.50 packs/day    Types: Cigarettes, Cigars    Start date: 07/09/1995    Quit date: 11/20/2011  . Smokeless tobacco: Never Used     Comment: pt quit cigarettes in 2009, pt smoke 2 black and milds a day until 09-2011  . Alcohol Use: 0.0 oz/week     Comment: Occasionally, "maybe 1-2 times/month"    OB History   Grav Para Term Preterm Abortions TAB SAB Ect Mult Living   2 1 1  0 1 0 1 0 0 1      Review of Systems  Constitutional: Negative for fever.  Skin: Positive for wound.  All other systems reviewed and are  negative.    Allergies  Morphine and related; Aspirin; Contrast media; Penicillins; Tramadol; and Vicodin  Home Medications   Current Outpatient Rx  Name  Route  Sig  Dispense  Refill  . albuterol (PROVENTIL HFA;VENTOLIN HFA) 108 (90 BASE) MCG/ACT inhaler   Inhalation   Inhale 2 puffs into the lungs every 4 (four) hours as needed for shortness of breath.          . budesonide-formoterol (SYMBICORT) 80-4.5 MCG/ACT inhaler   Inhalation   Inhale 2 puffs into the lungs 2 (two) times daily.   1 Inhaler   5   . diphenhydramine-acetaminophen (TYLENOL PM) 25-500 MG TABS   Oral   Take 1 tablet by mouth at bedtime as needed (for pain/sleep takes with oxycodone).         . famotidine (PEPCID) 20 MG tablet   Oral   Take 20 mg by mouth 2 (two) times daily as needed for heartburn.         Marland Kitchen ibuprofen (ADVIL,MOTRIN) 800 MG tablet   Oral   Take 1 tablet (800 mg total) by mouth every 8 (eight) hours as needed for pain (mild pain).   30 tablet   5   . oxyCODONE-acetaminophen (PERCOCET/ROXICET) 5-325 MG per tablet   Oral   Take 1-2 tablets by mouth every 4 (four) hours as needed.   40 tablet   0     Triage Vitals: BP 118/80  Pulse 70  Temp(Src) 98.3 F (36.8 C) (Oral)  Resp 16  SpO2 100%  LMP 05/01/2012  Physical Exam  Nursing note and vitals reviewed. Constitutional: She is oriented to person, place, and time. She appears well-developed and well-nourished.  HENT:  Head: Normocephalic and atraumatic.  Mouth/Throat: Oropharynx is clear and moist.  Eyes: Conjunctivae and EOM are normal. Pupils are equal, round, and reactive to light.  Neck: Normal range of motion.  Cardiovascular: Normal rate, regular rhythm and normal heart sounds.   Pulmonary/Chest: Effort normal and breath sounds normal.  Abdominal: Soft. Bowel sounds are normal.    RLQ laparoscopic incision with slight dehiscence, vicryl dissolvable suture visualized and still intact; no  signs of infection.   Musculoskeletal: Normal range of motion.  Neurological: She is alert and oriented to person, place, and time.  Skin: Skin is warm and dry.  Psychiatric: She has a normal mood and affect.    ED Course  Procedures (including critical care time) DIAGNOSTIC STUDIES: Oxygen Saturation is 100% on room air, normal by my interpretation.    COORDINATION OF CARE: 20:16--I evaluated the patient and we discussed a treatment plan including demabond laceration repair and nausea medication to which the  pt agreed.  LACERATION REPAIR PROCEDURE NOTE The patient's identification was confirmed and consent was obtained. This procedure was performed by Sharilyn Sites, PA at 8:51 PM. Site: RLQ of abdomen Sterile procedures observed: yes Anesthetic used (type and amt): none Suture type/size: dermabond Length: 2 cm Tetanus UTD or ordered: UTD Site explored without evidence of foreign body, wound well approximated, site covered with steri-strips. Patient tolerated procedure well without complications. Instructions for care discussed verbally and patient provided with additional written instructions for homecare and f/u.      Labs Reviewed - No data to display No results found.   1. External incisional dehiscence, initial encounter       MDM   Dermabond re-applied to RLQ incision with steri-strips applied over top once dried.  Instructed to use caution when removing steri-strips as to not pull dermabond off with them.  Rx zofran PRN nausea.  Keep post-op FU appt with GYN.  Return precautions advised.    I personally performed the services described in this documentation, which was scribed in my presence. The recorded information has been reviewed and is accurate.    Garlon Hatchet, PA-C 10/13/12 1212

## 2012-10-14 NOTE — ED Provider Notes (Signed)
Medical screening examination/treatment/procedure(s) were performed by non-physician practitioner and as supervising physician I was immediately available for consultation/collaboration.  Glynn Octave, MD 10/14/12 [phone]

## 2012-10-22 ENCOUNTER — Other Ambulatory Visit: Payer: Self-pay | Admitting: *Deleted

## 2012-10-22 DIAGNOSIS — B379 Candidiasis, unspecified: Secondary | ICD-10-CM

## 2012-10-22 MED ORDER — FLUCONAZOLE 150 MG PO TABS
150.0000 mg | ORAL_TABLET | Freq: Once | ORAL | Status: DC
Start: 2012-10-22 — End: 2013-03-16

## 2012-10-22 NOTE — Telephone Encounter (Signed)
Pt states she is having a white vaginal discharge with itching and irritation. Pt states she is also having burning when her urine passes over the tissue. Pt states she prefers a pills treatment. Pt advised to check with her pharmacy.

## 2012-10-31 IMAGING — CT CT ABD-PELV W/ CM
2 of 4 series · 16 of 46 positions shown, 18 images · IV contrast (APPLIED)
Comparison: CT of abdomen and pelvis 02/03/2011.

CLINICAL DATA: Chest pain.  Evaluate for potential cancer.  The
patient had DVT.

CT ABDOMEN AND PELVIS WITH CONTRAST
TECHNIQUE: Multidetector CT imaging of the abdomen and pelvis was
performed following the standard protocol during bolus
administration of intravenous contrast.
Contrast: 80mL OMNIPAQUE IOHEXOL 300 MG/ML  SOLN

[Series 2: abd/pelv with 5.0 b31f st · axial · 0.71mm/px · z∈[-462,-42]mm · 13 of 92 slices shown, 15 images]
[im 4/92  soft-tissue]
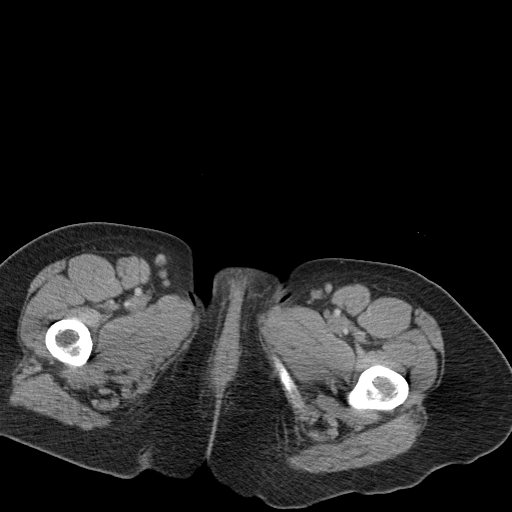
[im 4/92  bone]
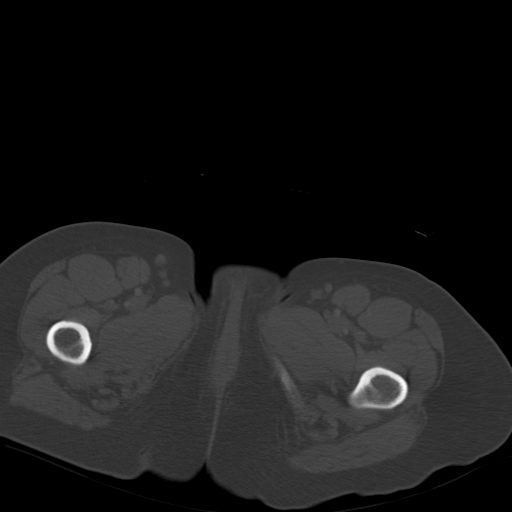
[im 12/92  soft-tissue]
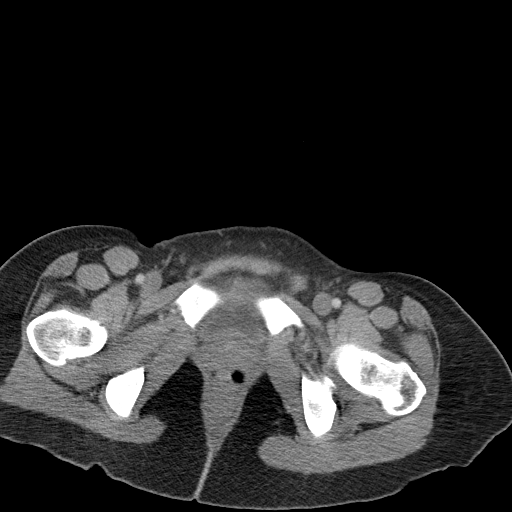
[im 20/92  soft-tissue]
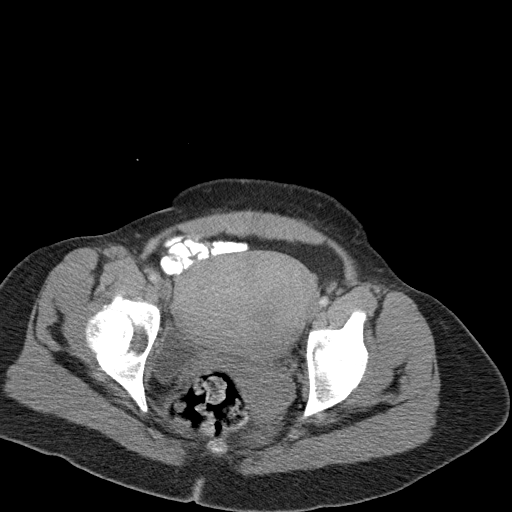
[im 24/92  soft-tissue]
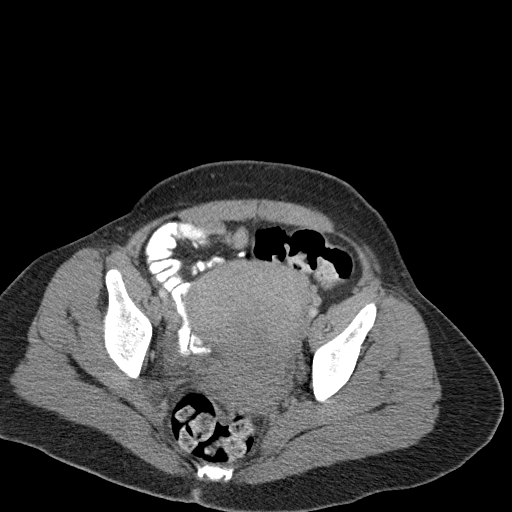
[im 32/92  soft-tissue]
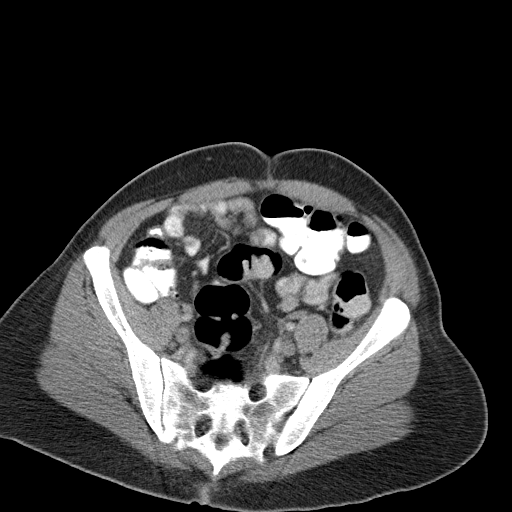
[im 40/92  soft-tissue]
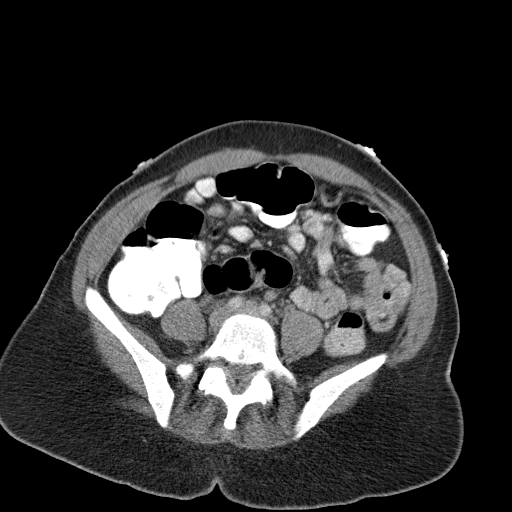
[im 48/92  soft-tissue]
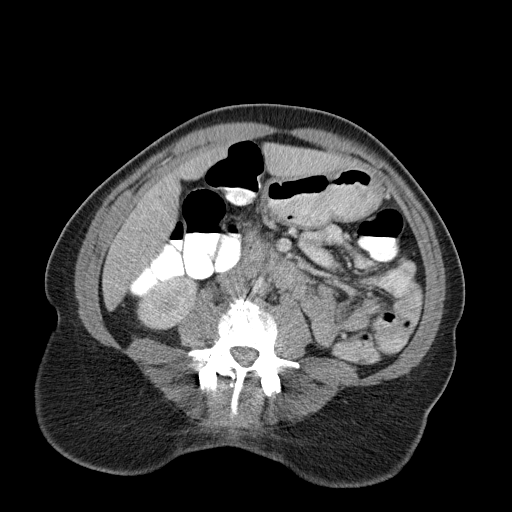
[im 52/92  soft-tissue]
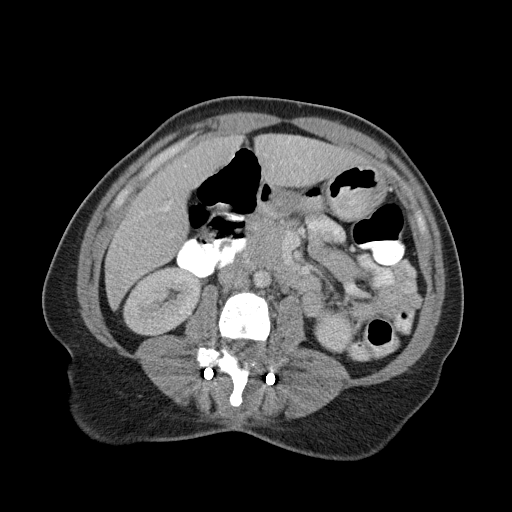
[im 60/92  soft-tissue]
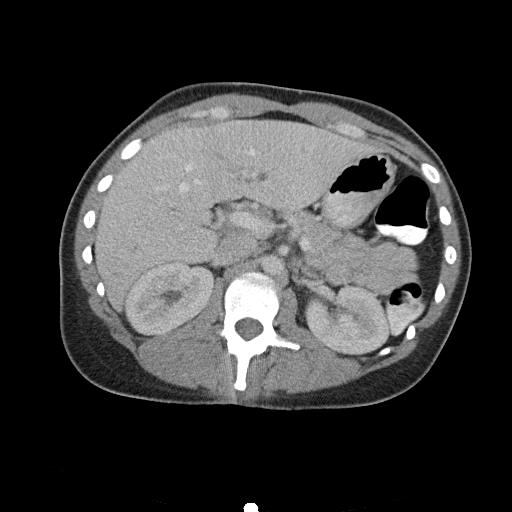
[im 60/92  bone]
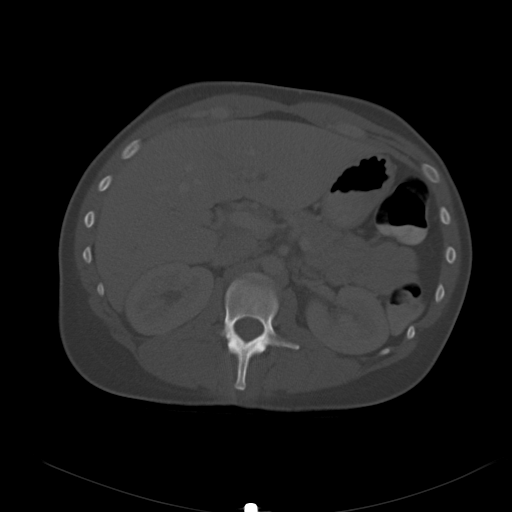
[im 68/92  soft-tissue]
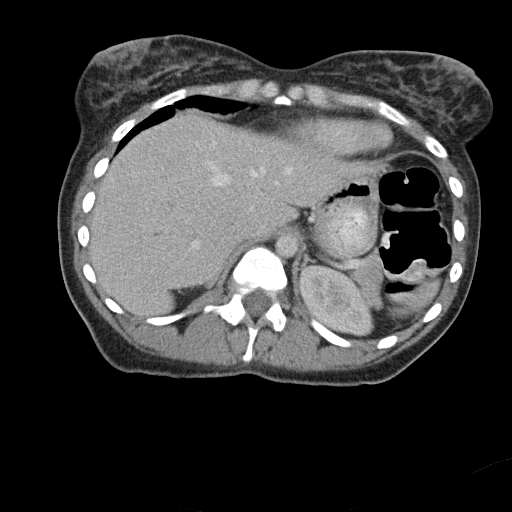
[im 72/92  soft-tissue]
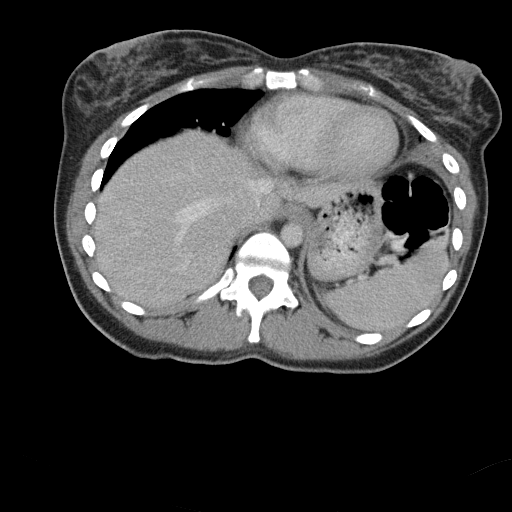
[im 80/92  soft-tissue]
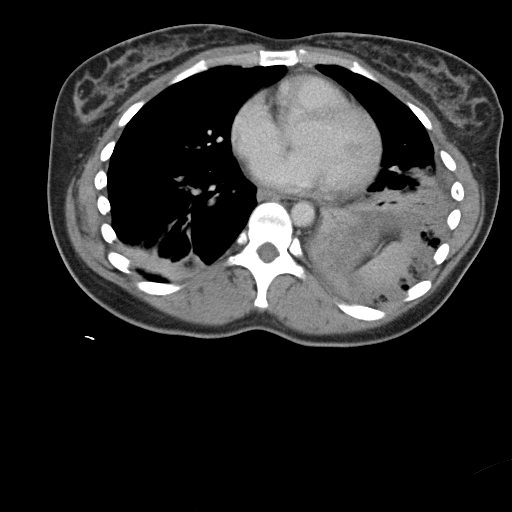
[im 88/92  soft-tissue]
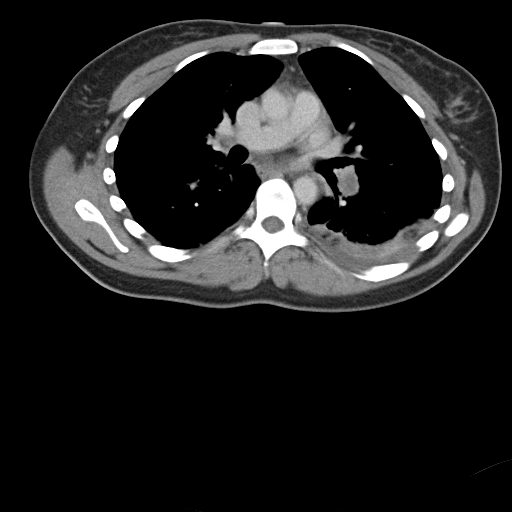

[Series 602: coronal · coronal · 0.90mm/px · 3 of 101 slices shown]
[im 34/101  soft-tissue]
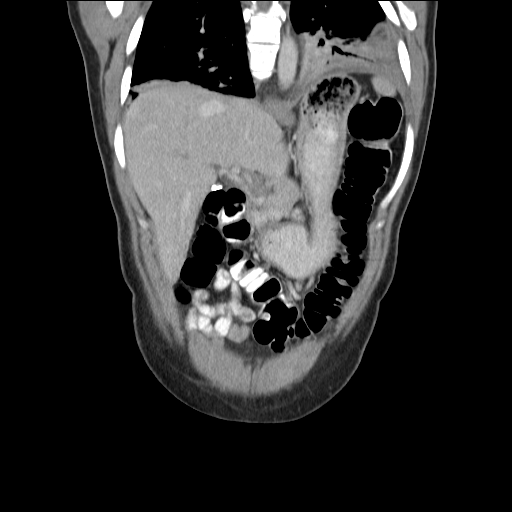
[im 45/101  soft-tissue]
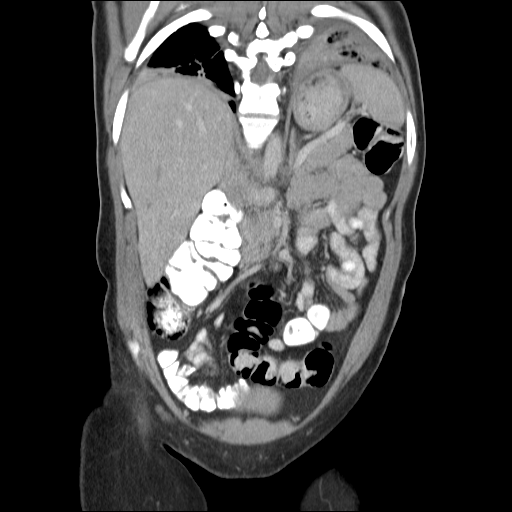
[im 56/101  soft-tissue]
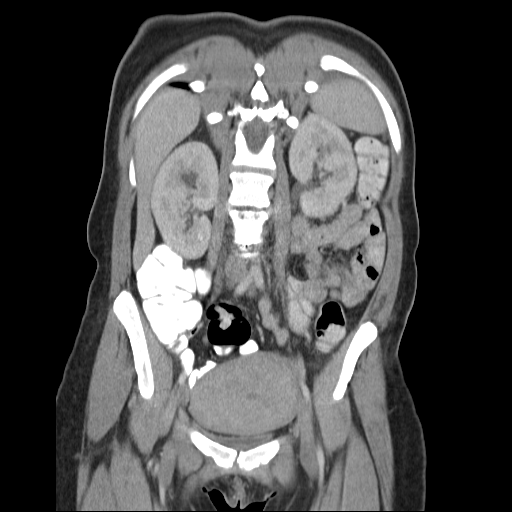

[16 of 46 positions shown; findings below may reference images not displayed]

FINDINGS: Lung Bases: Extensive occlusive thrombus is noted throughout all
aspects of the left lower lobe pulmonary arterial tree with
associated large pulmonary infarction in the left lower lobe, and
small amount of left-sided pleural effusion.  Some new atelectasis
is now noted in the right lower lobe.

Abdomen/Pelvis:  The patient is status post cholecystectomy.  There
is mild intrahepatic biliary ductal dilatation.  Common bile duct
itself does not appear dilated.  No definite focal hepatic lesions
are noted.  The appearance of the pancreas, spleen, bilateral
adrenal glands and bilateral kidneys is unremarkable.

There is no ascites or pneumoperitoneum and no pathologic
distension of bowel. No definite pathologic adenopathy identified
within the abdomen or pelvis.  The uterus, bilateral ovaries and
urinary bladder are unremarkable in appearance.

Musculoskeletal: There are no aggressive appearing lytic or blastic
lesions noted in the visualized portions of the skeleton.  The
patient is status post PLIF at L3-L4.
IMPRESSION: 1.  No definitive evidence of malignancy within the abdomen or
pelvis identified on today's examination.
2.  Persistent occlusive thrombus throughout the left lower lobe
with large left lower lobe pulmonary infarction.  There is now a
small overlying left-sided pleural effusion.
3.  Mild intrahepatic biliary ductal dilatation.  Common bile duct
does not appear dilated.  Correlation with LFTs may be warranted.
3.  Status post PLIF at L3-L4.
4.  Status post cholecystectomy.

## 2012-11-04 ENCOUNTER — Encounter: Payer: Medicaid Other | Admitting: Obstetrics

## 2012-11-16 ENCOUNTER — Ambulatory Visit (INDEPENDENT_AMBULATORY_CARE_PROVIDER_SITE_OTHER): Payer: Medicaid Other | Admitting: Obstetrics & Gynecology

## 2012-11-16 ENCOUNTER — Encounter: Payer: Self-pay | Admitting: Obstetrics & Gynecology

## 2012-11-16 VITALS — BP 118/77 | HR 77 | Temp 98.7°F | Ht 66.0 in | Wt 161.6 lb

## 2012-11-16 DIAGNOSIS — Z09 Encounter for follow-up examination after completed treatment for conditions other than malignant neoplasm: Secondary | ICD-10-CM

## 2012-11-16 NOTE — Progress Notes (Signed)
.   Subjective:     SHAINDY READER is a 37 y.o. female who presents to the clinic 4 weeks status post hysterectomy for abnormal uterine bleeding. Eating a regular diet with difficulty. Bowel movements are normal. Patient is having cramps still - would like a refill on her pain medication  The following portions of the patient's history were reviewed and updated as appropriate: allergies, current medications, past family history, past medical history, past social history, past surgical history and problem list.  Review of Systems Pertinent items are noted in HPI.    Objective:    BP 118/77  Pulse 77  Temp(Src) 98.7 F (37.1 C) (Oral)  Ht 5\' 6"  (1.676 m)  Wt 161 lb 9.6 oz (73.301 kg)  BMI 26.1 kg/m2  LMP 05/01/2012 General:  alert  Abdomen: soft, bowel sounds active, non-tender  Incision:   healing well, no drainage, no erythema, no hernia, no seroma, no swelling, no dehiscence, incision well approximated Suture removed from the periumbilical incision     Assessment:    Doing well postoperatively. Operative findings again reviewed. Pathology report discussed.    Plan:    1. Continue any current medications. 2. Wound care discussed. 3. Activity restrictions: no bending, stooping, or squatting, no gym class, no lifting more than 5 pounds and no sports 4. Follow up: 3 weeks

## 2012-11-16 NOTE — Patient Instructions (Signed)
Incision Care  An incision is when a surgeon cuts into your body tissues. After surgery, the incision needs to be cared for properly to prevent infection.   HOME CARE INSTRUCTIONS    Take all medicine as directed by your caregiver. Only take over-the-counter or prescription medicines for pain, discomfort, or fever as directed by your caregiver.   Do not remove your bandage (dressing) or get your incision wet until your surgeon gives you permission. In the event that your dressing becomes wet, dirty, or starts to smell, change the dressing and call your surgeon for instructions as soon as possible.   Take showers. Do not take tub baths, swim, or do anything that may soak the wound until it is healed.   Resume your normal diet and activities as directed or allowed.   Avoid lifting any weight until you are instructed otherwise.   Use anti-itch antihistamine medicine as directed by your caregiver. The wound may itch when it is healing. Do not pick or scratch at the wound.   Follow up with your caregiver for stitch (suture) or staple removal as directed.   Drink enough fluids to keep your urine clear or pale yellow.  SEEK MEDICAL CARE IF:    You have redness, swelling, or increasing pain in the wound that is not controlled with medicine.   You have drainage, blood, or pus coming from the wound that lasts longer than 1 day.   You develop muscle aches, chills, or a general ill feeling.   You notice a bad smell coming from the wound or dressing.   Your wound edges separate after the sutures, staples, or skin adhesive strips have been removed.   You develop persistent nausea or vomiting.  SEEK IMMEDIATE MEDICAL CARE IF:    You have a fever.   You develop a rash.   You develop dizzy episodes or faint while standing.   You have difficulty breathing.   You develop any reaction or side effects to medicine given.  MAKE SURE YOU:    Understand these instructions.   Will watch your condition.   Will get help  right away if you are not doing well or get worse.  Document Released: 01/11/2005 Document Revised: 09/16/2011 Document Reviewed: 10/28/2010  [employer] Patient Information 2013 [employer]

## 2012-11-18 ENCOUNTER — Ambulatory Visit: Payer: Self-pay | Admitting: Internal Medicine

## 2012-11-25 ENCOUNTER — Telehealth: Payer: Self-pay | Admitting: *Deleted

## 2012-11-25 MED ORDER — MEGESTROL ACETATE 40 MG PO TABS: 120.0000 mg | ORAL_TABLET | Freq: Two times a day (BID) | ORAL | Status: DC

## 2012-11-25 NOTE — Telephone Encounter (Signed)
Patient called regarding weight loss since her surgery.  She had a hysterectomy on 10/09/12 and since has lost 20 pounds.  Patient is requesting Megace  - same dose as before. Per Dr. Tamela Oddi - Megace 40mg  tablets - take three tablets PO BID called to [facility]([address]

## 2012-12-03 ENCOUNTER — Ambulatory Visit: Payer: Self-pay | Admitting: Internal Medicine

## 2012-12-03 ENCOUNTER — Encounter: Payer: Self-pay | Admitting: Obstetrics

## 2012-12-07 ENCOUNTER — Encounter: Payer: Medicaid Other | Admitting: Obstetrics & Gynecology

## 2012-12-24 ENCOUNTER — Encounter: Payer: Self-pay | Admitting: Obstetrics & Gynecology

## 2013-01-06 ENCOUNTER — Ambulatory Visit: Payer: Self-pay | Admitting: Internal Medicine

## 2013-01-06 IMAGING — CR DG CHEST 1V PORT
1 series · 1 of 1 positions shown · non-contrast
Comparison: 01/16/2012

CLINICAL DATA: Shortness of breath

PORTABLE CHEST - 1 VIEW

[AP]
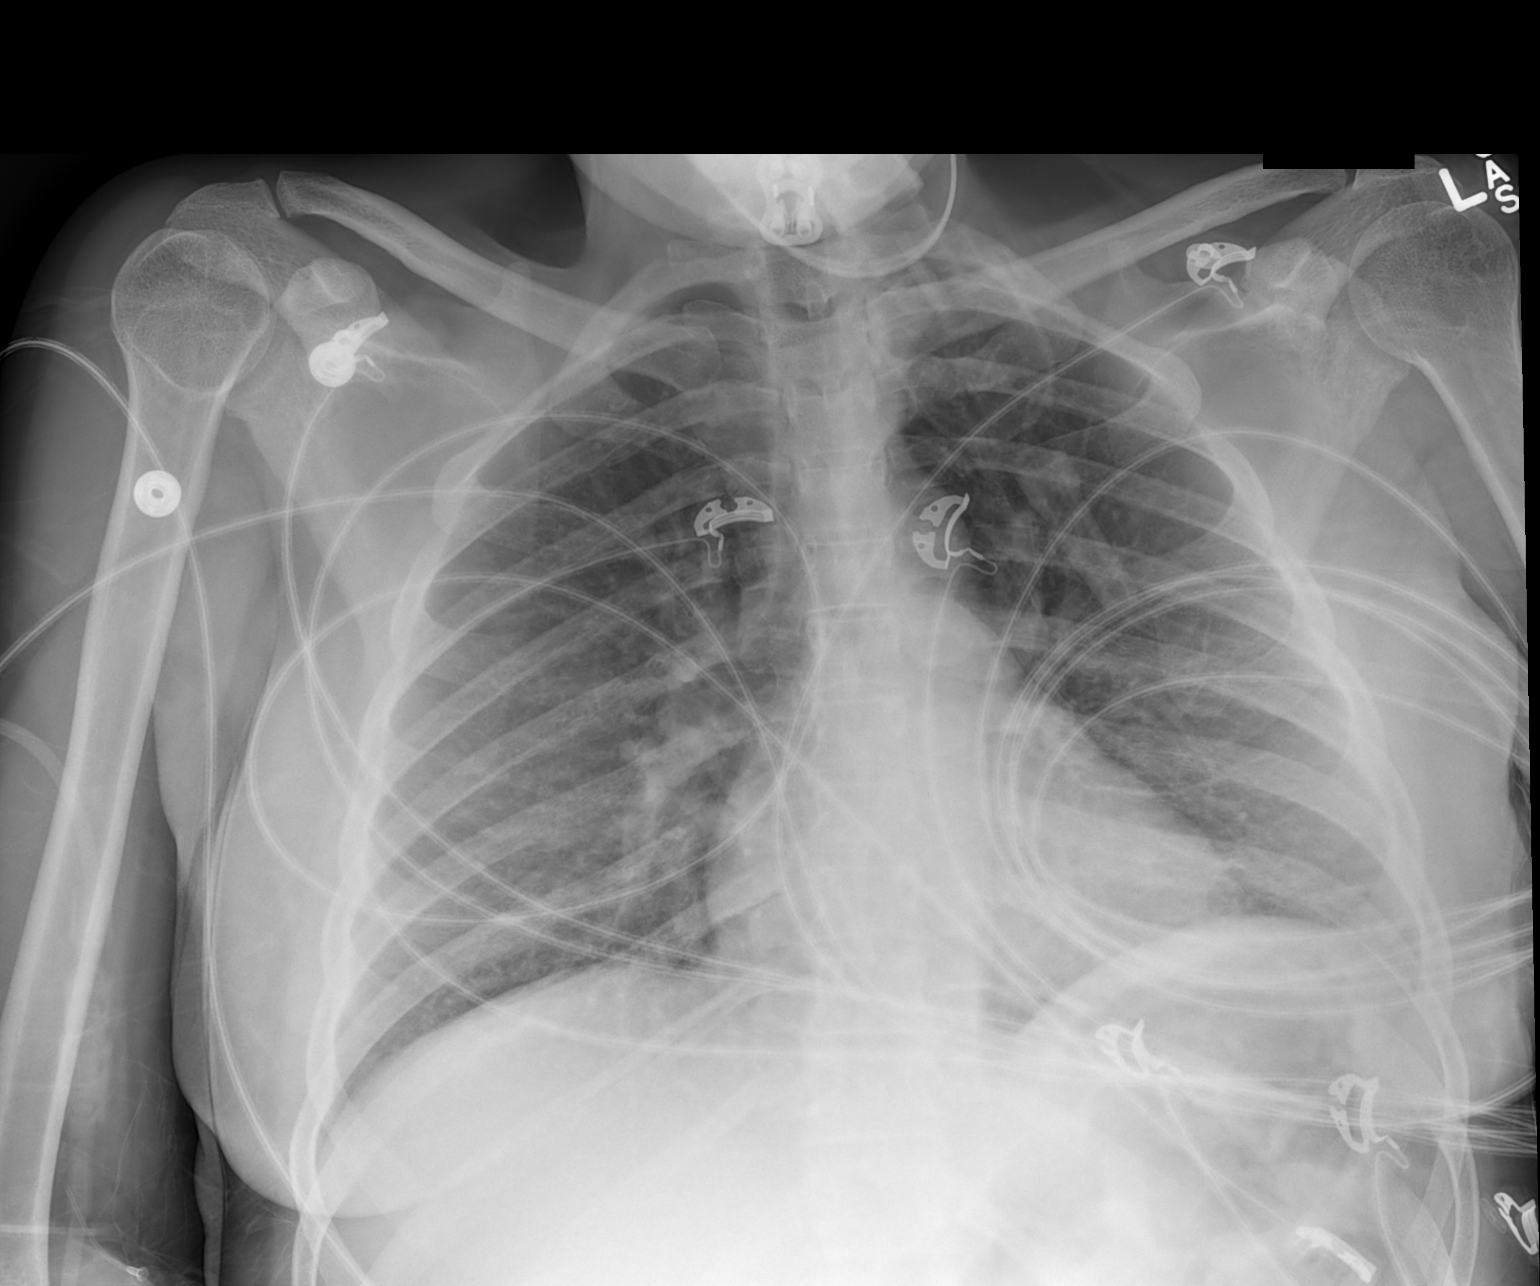

[1 of 1 positions shown; findings below may reference images not displayed]

FINDINGS: Slightly decreased lung volumes.  Normal heart size and
vascularity.  Negative for CHF, pneumonia, effusion or
pneumothorax.  Trachea midline.  Lower cervical fusion hardware
evident.  Monitor leads overlie the chest.
IMPRESSION: No acute chest process

## 2013-01-29 ENCOUNTER — Ambulatory Visit: Payer: Self-pay | Admitting: Internal Medicine

## 2013-02-24 ENCOUNTER — Encounter: Payer: Self-pay | Admitting: Obstetrics

## 2013-02-25 ENCOUNTER — Ambulatory Visit (INDEPENDENT_AMBULATORY_CARE_PROVIDER_SITE_OTHER): Payer: Medicaid Other | Admitting: Obstetrics

## 2013-02-25 VITALS — BP 130/90 | HR 92 | Temp 98.4°F | Ht 66.0 in | Wt 144.6 lb

## 2013-02-25 DIAGNOSIS — K219 Gastro-esophageal reflux disease without esophagitis: Secondary | ICD-10-CM

## 2013-02-25 DIAGNOSIS — E441 Mild protein-calorie malnutrition: Secondary | ICD-10-CM

## 2013-02-25 DIAGNOSIS — B373 Candidiasis of vulva and vagina: Secondary | ICD-10-CM

## 2013-02-25 MED ORDER — FLUCONAZOLE 150 MG PO TABS: 150.0000 mg | ORAL_TABLET | Freq: Once | ORAL | Status: DC

## 2013-02-25 MED ORDER — CITRANATAL HARMONY 27-1-250 MG PO CAPS: 1.0000 | ORAL_CAPSULE | Freq: Every day | ORAL | Status: DC

## 2013-02-25 MED ORDER — OMEPRAZOLE 20 MG PO CPDR: 20.0000 mg | DELAYED_RELEASE_CAPSULE | Freq: Every day | ORAL | Status: DC

## 2013-02-25 MED ORDER — ENSURE COMPLETE PO LIQD: 237.0000 mL | Freq: Two times a day (BID) | ORAL | Status: DC

## 2013-02-25 NOTE — Progress Notes (Signed)
.   Subjective:     Megan Chandler is a 37 y.o. female here for a problem exam.  Current complaints: Abdominal burning and cramping for about 2 -3 weeks.  Personal health questionnaire reviewed: yes.   Gynecologic History Patient's last menstrual period was 05/01/2012. -- Hysterectomy Contraception: none Last Pap: 2013. Results were: normal Last mammogram N/A  Obstetric History OB History  Gravida Para Term Preterm AB SAB TAB Ectopic Multiple Living  2 1 1  0 1 1 0 0 0 1    # Outcome Date GA Lbr Len/2nd Weight Sex Delivery Anes PTL Lv  2 SAB           1 TRM                The following portions of the patient's history were reviewed and updated as appropriate: allergies, current medications, past family history, past medical history, past social history, past surgical history and problem list.  Review of Systems Pertinent items are noted in HPI.    Objective:    General appearance: alert and no distress Abdomen: normal findings: soft, non-tender Pelvic: cervix normal in appearance, external genitalia normal, no adnexal masses or tenderness, no cervical motion tenderness, uterus normal size, shape, and consistency and vagina normal without discharge    Assessment:    GERD   Plan:    Omeprazole Rx.  Referred to Gastroenterologist.

## 2013-02-26 ENCOUNTER — Encounter: Payer: Self-pay | Admitting: Obstetrics

## 2013-02-26 DIAGNOSIS — K219 Gastro-esophageal reflux disease without esophagitis: Secondary | ICD-10-CM | POA: Insufficient documentation

## 2013-02-26 DIAGNOSIS — E441 Mild protein-calorie malnutrition: Secondary | ICD-10-CM | POA: Insufficient documentation

## 2013-02-26 DIAGNOSIS — B373 Candidiasis of vulva and vagina: Secondary | ICD-10-CM | POA: Insufficient documentation

## 2013-03-03 ENCOUNTER — Ambulatory Visit: Payer: Medicaid Other | Admitting: Obstetrics & Gynecology

## 2013-03-04 ENCOUNTER — Ambulatory Visit: Payer: Medicaid Other | Admitting: Obstetrics & Gynecology

## 2013-03-16 ENCOUNTER — Encounter: Payer: Self-pay | Admitting: Internal Medicine

## 2013-03-16 ENCOUNTER — Ambulatory Visit (INDEPENDENT_AMBULATORY_CARE_PROVIDER_SITE_OTHER): Payer: Medicaid Other | Admitting: Internal Medicine

## 2013-03-16 VITALS — BP 110/78 | HR 69 | Temp 97.8°F | Ht 66.0 in | Wt 154.0 lb

## 2013-03-16 DIAGNOSIS — J45909 Unspecified asthma, uncomplicated: Secondary | ICD-10-CM

## 2013-03-16 DIAGNOSIS — I2699 Other pulmonary embolism without acute cor pulmonale: Secondary | ICD-10-CM

## 2013-03-16 NOTE — Assessment & Plan Note (Signed)
She hsa not had recurrence of PE in a year since stopping coumadin. Have advised her to take baby aspirin 81mg  daily for another 1-2 years to prevent recurrence. She is non compliant and more complicated therapies are not poissible

## 2013-03-16 NOTE — Progress Notes (Signed)
Subjective:    Patient ID: Megan Chandler, female    DOB: 1976/04/19, 37 y.o.   MRN: [id number]  HPI PCP is Genella Mech, MD  # Overweight  - Body mass index is 27.60 kg/(m^2).  #ASthnma  - Since childhood. Unclear history. Reportedly intermittently on Symbicort  #Ex-smoker  - Quit smoking May 2013 at time of PE diagnosis  #Pulmonary EMbolism  -  IN Jan 2013 had some kind of surgery and post op had back pain. CT angio 07/26/11 ruled out PE - Diagnosed  Large Left PE 11/20/11 with acute symptoms in setting of  smoking and also was continued usage of her nuvaring. RXl DC Nuvaring, quit smoking, coumadin - vQ scan 04/28/12 shows that in the left lung lower part the clot is yet to clear though it cleared in other parts of the left lung.  - Took coumadin 11/20/11 through Dec 2013; self stopped coumadin  Because "I do not like meds" (last check was Sept 2013 with INR >2 and then never followed up)  #Anxiety  - petrified on heparin or lovenox inpatient injections   Office visit 08/14/2012 Megan Chandler presents for followup. I was supposed to see her back in October 2013 after her VQ scan mentioned above what she failed to show up. Of note she had had pulmonary embolism in May 2013 in the setting of smoking and nuvaring use. She took Coumadin from May to September with monitoring that showed proper compliance and therapeutic INR. In October 2013 she had a VQ scan that showed improvement in her clot burden but with some residual and a left lower lobe. After this she stopped following up although she says she continued with Coumadin December 2013 and then just got fed up with it and stopped it. At this point she denies any active complaints although mild shortness of breath with exertion persists [name] is a poor historian and history fluctuates due to her anxiety and her poor history historical quality]. She also says her that her asthma is stable and well controlled although she has run  out of her Symbicort and albuterol. Of note, the quality of asthma diagnosis itself is very questionable and needs further workup in the future  She now needs a total abdominal hysterectomy and is been referred by Dr. Antionette Char for pulmonary clearance    Walking desaturation test on 08/14/2012 185 feet x 3 laps:  did NOT* desaturate. Rest pulse ox was 98%, final pulse ox was 98%. HR response *?/min at rest to 123/min at peak exertion.    Pulmonary function test shows FEV1 of 2.5 L 83% with a bronchodilator response of 11%. FEV1 FEC ratio of 82 and normal. Total lung capacity of 4.3 L/79%. Diffusion capacity lower a 14/55%. Other than the low diffusion this is a normal test. It is reassuring that she did not desaturate with exertion and this low diffusion could be a technical issue  #asthma  - Appears controlled  - Take Symbicort 80/4.5, 2 puff 2 times a day scheduled and albuterol as needed  - Take Symbicort without fail because we do not want any asthma attack during surgery   #Blood clot in lung  - Do d-dimer blood test ; based on this I might recommend daily aspirin therapy  -  NORMAL  #Preoperative clearance prior to hysterectomy  - A lung standpoint it is okay to have this hysterectomy surgery but keep in mind that there is an increased risk for blood clotting and  asthma attack after the surgery  - That is really important that you continue Symbicort until you see me again after the surgery  - After the surgery I lasted doctors to give you medications to prevent blood clots: Because you are scared of injection needles I will tell them to give you xarelto 10 mg once a day which is a preventative dose   #Followup  - 2 months from now  OV 03/16/2013   Followup Suppsoed ASthma and s/p PE  Last seen February 2014.   After that she underwent successful hysterectomy without any complications according to her history. She is denying symptoms of any pulmonary embolism or deep vein  thrombosis. She's not taking any aspirin. She's not smoking and is in remission. She continues to be off Coumadin since September 2013 after taking it for 6 months. THis complete 1 year without Coumadin therapy  In terms of asthma: It is to be noted that this is the presumed diagnosis. She says that she is only symptomatic once or twice a month and takes her Symbicort only once or twice a month. Most of the symptoms as chest tightness and some anxiety-related shortness of breath. Only occasional wheeze. No cough. She's declined a flu shot. Spiorometry today (not compliant with scheduled mdi):   Past, Family, Social reviewed: disabled due to low back issues. SHe has lost > 30# since hysterectomy and is Body mass index is 24.87 kg/(m^2). and is requesting medications to gain weight    Review of Systems  Constitutional: Negative for fever and unexpected weight change.  HENT: Negative for ear pain, nosebleeds, congestion, sore throat, rhinorrhea, sneezing, trouble swallowing, dental problem, postnasal drip and sinus pressure.   Eyes: Negative for redness and itching.  Respiratory: Negative for cough, chest tightness, shortness of breath and wheezing.   Cardiovascular: Negative for palpitations and leg swelling.  Gastrointestinal: Negative for nausea and vomiting.  Genitourinary: Negative for dysuria.  Musculoskeletal: Negative for joint swelling.  Skin: Negative for rash.  Neurological: Negative for headaches.  Hematological: Does not bruise/bleed easily.  Psychiatric/Behavioral: Negative for dysphoric mood. The patient is not nervous/anxious.    Current outpatient prescriptions:budesonide-formoterol (SYMBICORT) 80-4.5 MCG/ACT inhaler, Inhale 2 puffs into the lungs 2 (two) times daily., Disp: 1 Inhaler, Rfl: 5;  HYDROcodone-acetaminophen (NORCO) 10-325 MG per tablet, Take 1 tablet by mouth every 6 (six) hours as needed for pain., Disp: , Rfl:  ibuprofen (ADVIL,MOTRIN) 800 MG tablet, Take 1 tablet  (800 mg total) by mouth every 8 (eight) hours as needed for pain (mild pain)., Disp: 30 tablet, Rfl: 5;  omeprazole (PRILOSEC) 20 MG capsule, Take 1 capsule (20 mg total) by mouth daily., Disp: 30 capsule, Rfl: 11;  Prenat w/o A-FeCbn-DSS-FA-DHA (CITRANATAL HARMONY) 27-1-250 MG CAPS, Take 1 capsule by mouth daily before breakfast., Disp: 30 capsule, Rfl: 11     Objective:   Physical Exam Vitals reviewed. Constitutional: She is oriented to person, place, and time. She appears well-developed and well-nourished. No distress.  Body mass index is 24.87 kg/(m^2).  Marland Kitchen   HENT:  Head: Normocephalic and atraumatic.  Right Ear: External ear normal.  Left Ear: External ear normal.  Mouth/Throat: Oropharynx is clear and moist. No oropharyngeal exudate.  Eyes: Conjunctivae normal and EOM are normal. Pupils are equal, round, and reactive to light. Right eye exhibits no discharge. Left eye exhibits no discharge. No scleral icterus.  Neck: Normal range of motion. Neck supple. No JVD present. No tracheal deviation present. No thyromegaly present.  Cardiovascular: Normal rate,  regular rhythm, normal heart sounds and intact distal pulses.  Exam reveals no gallop and no friction rub.   No murmur heard. Pulmonary/Chest: Effort normal and breath sounds normal. No respiratory distress. She has no wheezes. She has no rales. She exhibits no tenderness.   Abdominal: Soft. Bowel sounds are normal. She exhibits no distension and no mass. There is no tenderness. There is no rebound and no guarding.  Musculoskeletal: Normal range of motion. She exhibits no edema and no tenderness.  Lymphadenopathy:    She has no cervical adenopathy.  Neurological: She is alert and oriented to person, place, and time. She has normal reflexes. No cranial nerve deficit. She exhibits normal muscle tone. Coordination normal.  Skin: Skin is warm and dry. No rash noted. She is not diaphoretic. No erythema. No pallor.  Psychiatric: She has a  normal mood and affect. Her behavior is normal. Judgment and thought content normal.         Assessment & Plan:

## 2013-03-16 NOTE — Patient Instructions (Addendum)
#  Pulmonary embolism  - glad you are doing well and without recurrence of blood clot - I recommend over the counter baby aspirin 81mg  daily for another 1-2 years daily  #ASthma  - respect your decision to refuse flu shot - given good control of your asthma: use symbicort as needed - spirometry at followup  #ROV  6months with spirometry at followp

## 2013-03-16 NOTE — Assessment & Plan Note (Signed)
This is a presumed dx. Current symbicort use suggests under-perception vs very well controlled. She does not want flu shot. I cannot expect her to be compliant so will accept strategy of prn symbicort/. Will test sprimetry at followup. She is agreeable with plan

## 2013-03-24 ENCOUNTER — Encounter: Payer: Self-pay | Admitting: Obstetrics & Gynecology

## 2013-03-24 ENCOUNTER — Ambulatory Visit (INDEPENDENT_AMBULATORY_CARE_PROVIDER_SITE_OTHER): Payer: Medicaid Other | Admitting: Obstetrics & Gynecology

## 2013-03-24 VITALS — BP 135/84 | HR 98 | Temp 98.8°F

## 2013-03-24 DIAGNOSIS — N760 Acute vaginitis: Secondary | ICD-10-CM

## 2013-03-24 DIAGNOSIS — B3731 Acute candidiasis of vulva and vagina: Secondary | ICD-10-CM

## 2013-03-24 DIAGNOSIS — Z113 Encounter for screening for infections with a predominantly sexual mode of transmission: Secondary | ICD-10-CM

## 2013-03-24 DIAGNOSIS — B373 Candidiasis of vulva and vagina: Secondary | ICD-10-CM

## 2013-03-24 LAB — POCT WET PREP (WET MOUNT)

## 2013-03-24 NOTE — Progress Notes (Signed)
Subjective:     Megan Chandler is a 37 y.o. female here for a routine exam.  Current complaints: Patient is in a new relationship and is requesting STD check..  Personal health questionnaire reviewed: no.   Gynecologic History Patient's last menstrual period was 05/01/2012. Contraception: status post hysterectomy Last Pap: pre surgical.  Obstetric History OB History  Gravida Para Term Preterm AB SAB TAB Ectopic Multiple Living  2 1 1  0 1 1 0 0 0 1    # Outcome Date GA Lbr Len/2nd Weight Sex Delivery Anes PTL Lv  2 SAB           1 TRM                The following portions of the patient's history were reviewed and updated as appropriate: allergies, current medications, past family history, past medical history, past social history, past surgical history and problem list.  Review of Systems Pertinent items are noted in HPI.    Objective:    SPEC: copious, thick, white discharge Bimanual: no masses, NT    Assessment:   Candida vulvovaginitis Declined flu vaccine  Plan:    Diflucan STI screen Return in 6 mths

## 2013-03-24 NOTE — Patient Instructions (Signed)
Candidal Vulvovaginitis  Candidal vulvovaginitis is an infection of the vagina and vulva. The vulva is the skin around the opening of the vagina. This may cause itching and discomfort in and around the vagina.   HOME CARE  · Only take medicine as told by your doctor.  · Do not have sex (intercourse) until the infection is healed or as told by your doctor.  · Practice safe sex.  · Tell your sex partner about your infection.  · Do not douche or use tampons.  · Wear cotton underwear. Do not wear tight pants or panty hose.  · Eat yogurt. This may help treat and prevent yeast infections.  GET HELP RIGHT AWAY IF:   · You have a fever.  · Your problems get worse during treatment or do not get better in 3 days.  · You have discomfort, irritation, or itching in your vagina or vulva area.  · You have pain after sex.  · You start to get belly (abdominal) pain.  MAKE SURE YOU:  · Understand these instructions.  · Will watch your condition.  · Will get help right away if you are not doing well or get worse.  Document Released: 09/20/2008 Document Revised: 09/16/2011 Document Reviewed: 09/20/2008  [employer]® Patient Information ©2014 [employer].

## 2013-03-25 LAB — RPR

## 2013-03-25 LAB — WET PREP BY MOLECULAR PROBE: Trichomonas vaginosis: NEGATIVE

## 2013-03-30 ENCOUNTER — Telehealth: Payer: Self-pay | Admitting: *Deleted

## 2013-03-30 DIAGNOSIS — B9689 Other specified bacterial agents as the cause of diseases classified elsewhere: Secondary | ICD-10-CM

## 2013-03-30 MED ORDER — METRONIDAZOLE 500 MG PO TABS: 500.0000 mg | ORAL_TABLET | Freq: Two times a day (BID) | ORAL | Status: DC

## 2013-03-30 NOTE — Telephone Encounter (Signed)
Patient called regarding lab results. Patient has BV and would like treatment and copy of her lab results.

## 2013-04-02 ENCOUNTER — Encounter: Payer: Self-pay | Admitting: Gastroenterology

## 2013-04-05 ENCOUNTER — Telehealth: Payer: Self-pay | Admitting: *Deleted

## 2013-04-05 DIAGNOSIS — B373 Candidiasis of vulva and vagina: Secondary | ICD-10-CM

## 2013-04-05 MED ORDER — FLUCONAZOLE 150 MG PO TABS: 150.0000 mg | ORAL_TABLET | Freq: Once | ORAL | Status: DC

## 2013-04-05 NOTE — Telephone Encounter (Signed)
Patient called requesting Diflucan for yeast infection after using Flagyl. Rx sent to patient's pharmacy.

## 2013-05-13 ENCOUNTER — Other Ambulatory Visit: Payer: Self-pay

## 2013-07-27 ENCOUNTER — Other Ambulatory Visit: Payer: Self-pay | Admitting: *Deleted

## 2013-07-27 DIAGNOSIS — N760 Acute vaginitis: Principal | ICD-10-CM

## 2013-07-27 DIAGNOSIS — B9689 Other specified bacterial agents as the cause of diseases classified elsewhere: Secondary | ICD-10-CM

## 2013-07-27 DIAGNOSIS — B3731 Acute candidiasis of vulva and vagina: Secondary | ICD-10-CM

## 2013-07-27 DIAGNOSIS — B373 Candidiasis of vulva and vagina: Secondary | ICD-10-CM

## 2013-07-27 MED ORDER — METRONIDAZOLE 500 MG PO TABS: 500.0000 mg | ORAL_TABLET | Freq: Two times a day (BID) | ORAL | Status: DC

## 2013-07-27 MED ORDER — FLUCONAZOLE 150 MG PO TABS: 150.0000 mg | ORAL_TABLET | Freq: Once | ORAL | Status: DC

## 2013-09-21 ENCOUNTER — Ambulatory Visit: Payer: Self-pay | Admitting: Internal Medicine

## 2013-09-22 ENCOUNTER — Ambulatory Visit: Payer: Medicaid Other | Admitting: Obstetrics & Gynecology

## 2013-09-23 ENCOUNTER — Other Ambulatory Visit: Payer: Self-pay | Admitting: *Deleted

## 2013-09-23 DIAGNOSIS — B373 Candidiasis of vulva and vagina: Secondary | ICD-10-CM

## 2013-09-23 DIAGNOSIS — B3731 Acute candidiasis of vulva and vagina: Secondary | ICD-10-CM

## 2013-09-23 DIAGNOSIS — B9689 Other specified bacterial agents as the cause of diseases classified elsewhere: Secondary | ICD-10-CM

## 2013-09-23 DIAGNOSIS — N760 Acute vaginitis: Principal | ICD-10-CM

## 2013-09-23 MED ORDER — METRONIDAZOLE 500 MG PO TABS: 500.0000 mg | ORAL_TABLET | Freq: Two times a day (BID) | ORAL | Status: DC

## 2013-09-23 MED ORDER — FLUCONAZOLE 150 MG PO TABS: 150.0000 mg | ORAL_TABLET | Freq: Once | ORAL | Status: DC

## 2013-09-29 ENCOUNTER — Ambulatory Visit: Payer: Medicaid Other | Admitting: Obstetrics & Gynecology

## 2013-09-30 ENCOUNTER — Ambulatory Visit (INDEPENDENT_AMBULATORY_CARE_PROVIDER_SITE_OTHER): Payer: Medicaid Other | Admitting: Internal Medicine

## 2013-09-30 ENCOUNTER — Encounter: Payer: Self-pay | Admitting: Internal Medicine

## 2013-09-30 ENCOUNTER — Telehealth: Payer: Self-pay | Admitting: Internal Medicine

## 2013-09-30 VITALS — BP 120/80 | HR 88 | Ht 66.0 in | Wt 160.8 lb

## 2013-09-30 DIAGNOSIS — J45909 Unspecified asthma, uncomplicated: Secondary | ICD-10-CM

## 2013-09-30 DIAGNOSIS — F1210 Cannabis abuse, uncomplicated: Secondary | ICD-10-CM | POA: Diagnosis not present

## 2013-09-30 DIAGNOSIS — Z86711 Personal history of pulmonary embolism: Secondary | ICD-10-CM

## 2013-09-30 DIAGNOSIS — R002 Palpitations: Secondary | ICD-10-CM | POA: Diagnosis not present

## 2013-09-30 DIAGNOSIS — R0602 Shortness of breath: Secondary | ICD-10-CM

## 2013-09-30 DIAGNOSIS — F1290 Cannabis use, unspecified, uncomplicated: Secondary | ICD-10-CM

## 2013-09-30 MED ORDER — BUDESONIDE-FORMOTEROL FUMARATE 80-4.5 MCG/ACT IN AERO: 2.0000 | INHALATION_SPRAY | Freq: Two times a day (BID) | RESPIRATORY_TRACT | Status: DC

## 2013-09-30 NOTE — Telephone Encounter (Signed)
Exercise can be cleared only after she sees cards   Calf pain: do duplex LE rule out dvt due to prior hx of PE   Dr. Brand Males, M.D., [facility].C.P Pulmonary and Critical Care Medicine Staff Physician [facility] Pager: [phone], If no answer or between  15:00h - 7:00h: call [phone]  09/30/2013 5:44 PM

## 2013-09-30 NOTE — Patient Instructions (Signed)
#  tachycardia and palpitations  - see [facility] heart care cardiologist  #Marijuana  - dangerous to your health; please quit  #ASthma  continue symbicort  #PRior Pulmonary embolism  -please restart aspirin 81mg  daily;; important otherwise you could a get PE again  #Followup 9 months or sooner if needed

## 2013-09-30 NOTE — Progress Notes (Signed)
Subjective:    Patient ID: Megan Chandler, female    DOB: 08-17-1975, 38 y.o.   MRN: [id number]  HPI  PCP is Vertell Novak, MD  # Overweight  - Body mass index is 27.60 kg/(m^2).  #ASthnma  - Since childhood. Unclear history. Reportedly intermittently on Symbicort  #Ex-smoker  - Quit smoking May 2013 at time of PE diagnosis  #Pulmonary EMbolism  -  IN Jan 2013 had some kind of surgery and post op had back pain. CT angio 07/26/11 ruled out PE - Diagnosed  Large Left PE 11/20/11 with acute symptoms in setting of  smoking and also was continued usage of her nuvaring. RXl DC Nuvaring, quit smoking, coumadin - vQ scan 04/28/12 shows that in the left lung lower part the clot is yet to clear though it cleared in other parts of the left lung.  - Took coumadin 11/20/11 through Dec 2013; self stopped coumadin  Because "I do not like meds" (last check was Sept 2013 with INR >2 and then never followed up)  #Anxiety  - petrified on heparin or lovenox inpatient injections   Office visit 08/14/2012 Megan Chandler presents for followup. I was supposed to see her back in October 2013 after her VQ scan mentioned above what she failed to show up. Of note she had had pulmonary embolism in May 2013 in the setting of smoking and nuvaring use. She took Coumadin from May to September with monitoring that showed proper compliance and therapeutic INR. In October 2013 she had a VQ scan that showed improvement in her clot burden but with some residual and a left lower lobe. After this she stopped following up although she says she continued with Coumadin December 2013 and then just got fed up with it and stopped it. At this point she denies any active complaints although mild shortness of breath with exertion persists Megan Chandler is a poor historian and history fluctuates due to her anxiety and her poor history historical quality]. She also says her that her asthma is stable and well controlled although she has  run out of her Symbicort and albuterol. Of note, the quality of asthma diagnosis itself is very questionable and needs further workup in the future  She now needs a total abdominal hysterectomy and is been referred by Dr. Lahoma Crocker for pulmonary clearance    Walking desaturation test on 08/14/2012 185 feet x 3 laps:  did NOT* desaturate. Rest pulse ox was 98%, final pulse ox was 98%. HR response *?/min at rest to 123/min at peak exertion.    Pulmonary function test shows FEV1 of 2.5 L 83% with a bronchodilator response of 11%. FEV1 FEC ratio of 82 and normal. Total lung capacity of 4.3 L/79%. Diffusion capacity lower a 14/55%. Other than the low diffusion this is a normal test. It is reassuring that she did not desaturate with exertion and this low diffusion could be a technical issue  #asthma  - Appears controlled  - Take Symbicort 80/4.5, 2 puff 2 times a day scheduled and albuterol as needed  - Take Symbicort without fail because we do not want any asthma attack during surgery   #Blood clot in lung  - Do d-dimer blood test ; based on this I might recommend daily aspirin therapy  -  NORMAL  #Preoperative clearance prior to hysterectomy  - A lung standpoint it is okay to have this hysterectomy surgery but keep in mind that there is an increased risk for blood  clotting and asthma attack after the surgery  - That is really important that you continue Symbicort until you see me again after the surgery  - After the surgery I lasted doctors to give you medications to prevent blood clots: Because you are scared of injection needles I will tell them to give you xarelto 10 mg once a day which is a preventative dose   #Followup  - 2 months from now  OV 03/16/2013   Followup Suppsoed ASthma and s/p PE  Last seen February 2014.   After that she underwent successful hysterectomy without any complications according to her history. She is denying symptoms of any pulmonary embolism or deep  vein thrombosis. She's not taking any aspirin. She's not smoking and is in remission. She continues to be off Coumadin since September 2013 after taking it for 6 months. THis complete 1 year without Coumadin therapy  In terms of asthma: It is to be noted that this is the presumed diagnosis. She says that she is only symptomatic once or twice a month and takes her Symbicort only once or twice a month. Most of the symptoms as chest tightness and some anxiety-related shortness of breath. Only occasional wheeze. No cough. She's declined a flu shot. Spiorometry today (not compliant with scheduled mdi):   Past, Family, Social reviewed: disabled due to low back issues. SHe has lost > 30# since hysterectomy and is Body mass index is 24.87 kg/(m^2). and is requesting medications to gain wei  REC #Pulmonary embolism  - glad you are doing well and without recurrence of blood clot - I recommend over the counter baby aspirin 81mg  daily for another 1-2 years daily  #ASthma  - respect your decision to refuse flu shot - given good control of your asthma: use symbicort as needed - spirometry at followup  #ROV  30months with spirometry at [place] 09/30/2013  Chief Complaint  Patient presents with  . Acute Visit    Pt states that she has been feeling like her heart is racing fast and when htis happens she gets SOB x 1 month.  Pt also c/o having pain in the center of her chest at times.      Followup previous pulmonary embolus and, asthma and obesity  In terms of pulmonary: She status post Coumadin treatment. I advised her to take baby aspirin once daily but she's not doing this and refuses to do this anymore.  In terms of asthma: She says this is stable. She is compliant with Symbicort daily.  Obesity: She's not lost any weight   Estimated body mass index is 25.97 kg/(m^2) as calculated from the following:   Height as of this encounter: 5\' 6"  (1.676 m).   Weight as of this encounter: 160 lb  12.8 oz (72.938 kg).  New issue: For the last few months she's been doing marijuana in terms of smoking. However for the last 6 weeks she's been noticing on and off tachycardia that is random is both day and night. No specific aggravating or relieving factors. She does smoke associated marijuana and drinks soda periodically but she's not forthcoming with the quality of this. No associated chest pain or shortness of breath or wheeze Review of Systems  Constitutional: Negative for fever and unexpected weight change.  HENT: Negative for congestion, dental problem, ear pain, nosebleeds, postnasal drip, rhinorrhea, sinus pressure, sneezing, sore throat and trouble swallowing.   Eyes: Negative for redness and itching.  Respiratory: Positive for shortness of breath.  Negative for cough, chest tightness and wheezing.   Cardiovascular: Positive for chest pain and palpitations. Negative for leg swelling.  Gastrointestinal: Negative for nausea and vomiting.  Genitourinary: Negative for dysuria.  Musculoskeletal: Negative for joint swelling.  Skin: Negative for rash.  Neurological: Negative for headaches.  Hematological: Does not bruise/bleed easily.  Psychiatric/Behavioral: Negative for dysphoric mood. The patient is not nervous/anxious.        Objective:   Physical Exam  Vitals reviewed. Constitutional: She is oriented to person, place, and time. She appears well-developed and well-nourished. No distress.  HENT:  Head: Normocephalic and atraumatic.  Right Ear: External ear normal.  Left Ear: External ear normal.  Mouth/Throat: Oropharynx is clear and moist. No oropharyngeal exudate.  Eyes: Conjunctivae and EOM are normal. Pupils are equal, round, and reactive to light. Right eye exhibits no discharge. Left eye exhibits no discharge. No scleral icterus.  Neck: Normal range of motion. Neck supple. No JVD present. No tracheal deviation present. No thyromegaly present.  Cardiovascular: Normal rate,  regular rhythm, normal heart sounds and intact distal pulses.  Exam reveals no gallop and no friction rub.   No murmur heard. Pulmonary/Chest: Effort normal and breath sounds normal. No respiratory distress. She has no wheezes. She has no rales. She exhibits no tenderness.  Abdominal: Soft. Bowel sounds are normal. She exhibits no distension and no mass. There is no tenderness. There is no rebound and no guarding.  Musculoskeletal: Normal range of motion. She exhibits no edema and no tenderness.  Lymphadenopathy:    She has no cervical adenopathy.  Neurological: She is alert and oriented to person, place, and time. She has normal reflexes. No cranial nerve deficit. She exhibits normal muscle tone. Coordination normal.  Skin: Skin is warm and dry. No rash noted. She is not diaphoretic. No erythema. No pallor.  Psychiatric: She has a normal mood and affect. Her behavior is normal. Judgment and thought content normal.          Assessment & Plan:  #tachycardia and palpitations  - see [facility] heart care cardiologist  #Marijuana Use  - dangerous to your health; please quit  #ASthma  continue symbicort  #PRior Pulmonary embolism  -please restart aspirin 81mg  daily;; important otherwise you could a get PE again  #Followup 9 months or sooner if needed

## 2013-09-30 NOTE — Telephone Encounter (Signed)
Spoke with the pt  She states that she forgot to ask MR today if there are any restrictions that she should follow when it comes to exercise She also forgot to mention that she has had rt calf pain "feels like a charlie horse" on and off x 2 wks  Her leg is not red, swollen, or ward to touch  Please advise thanks!

## 2013-10-01 NOTE — Telephone Encounter (Signed)
LMTCB

## 2013-10-04 ENCOUNTER — Ambulatory Visit: Payer: Medicaid Other | Admitting: Obstetrics & Gynecology

## 2013-10-05 NOTE — Telephone Encounter (Signed)
ATC, message states customer unavailable at this time. Jacksonwald Bing, CMA

## 2013-10-07 NOTE — Telephone Encounter (Signed)
Spoke with patient-she is aware of exercise to be cleared for after she sees cards and we have order duplex LE as patient continues to have leg pain that is moving up.   Pt is aware that I have placed order to [facility] as STAT and she should expect a call soon.

## 2013-10-08 ENCOUNTER — Emergency Department (HOSPITAL_COMMUNITY)
Admission: EM | Admit: 2013-10-08 | Discharge: 2013-10-08 | Disposition: A | Discharge status: Home / Self Care | Payer: Medicaid Other | Attending: Emergency Medicine | Admitting: Emergency Medicine

## 2013-10-08 ENCOUNTER — Encounter (HOSPITAL_COMMUNITY): Payer: Self-pay | Admitting: Emergency Medicine

## 2013-10-08 DIAGNOSIS — Z862 Personal history of diseases of the blood and blood-forming organs and certain disorders involving the immune mechanism: Secondary | ICD-10-CM | POA: Insufficient documentation

## 2013-10-08 DIAGNOSIS — R21 Rash and other nonspecific skin eruption: Secondary | ICD-10-CM | POA: Insufficient documentation

## 2013-10-08 DIAGNOSIS — Z880 Allergy status to penicillin: Secondary | ICD-10-CM | POA: Insufficient documentation

## 2013-10-08 DIAGNOSIS — Z87891 Personal history of nicotine dependence: Secondary | ICD-10-CM | POA: Insufficient documentation

## 2013-10-08 DIAGNOSIS — Z8709 Personal history of other diseases of the respiratory system: Secondary | ICD-10-CM | POA: Insufficient documentation

## 2013-10-08 DIAGNOSIS — Z8742 Personal history of other diseases of the female genital tract: Secondary | ICD-10-CM | POA: Insufficient documentation

## 2013-10-08 DIAGNOSIS — Z8719 Personal history of other diseases of the digestive system: Secondary | ICD-10-CM | POA: Insufficient documentation

## 2013-10-08 DIAGNOSIS — I10 Essential (primary) hypertension: Secondary | ICD-10-CM | POA: Insufficient documentation

## 2013-10-08 DIAGNOSIS — Z87440 Personal history of urinary (tract) infections: Secondary | ICD-10-CM | POA: Insufficient documentation

## 2013-10-08 DIAGNOSIS — G8929 Other chronic pain: Secondary | ICD-10-CM | POA: Insufficient documentation

## 2013-10-08 DIAGNOSIS — IMO0002 Reserved for concepts with insufficient information to code with codable children: Secondary | ICD-10-CM | POA: Insufficient documentation

## 2013-10-08 DIAGNOSIS — Z8739 Personal history of other diseases of the musculoskeletal system and connective tissue: Secondary | ICD-10-CM | POA: Insufficient documentation

## 2013-10-08 DIAGNOSIS — J45909 Unspecified asthma, uncomplicated: Secondary | ICD-10-CM | POA: Insufficient documentation

## 2013-10-08 DIAGNOSIS — Z8659 Personal history of other mental and behavioral disorders: Secondary | ICD-10-CM | POA: Insufficient documentation

## 2013-10-08 DIAGNOSIS — Z86711 Personal history of pulmonary embolism: Secondary | ICD-10-CM | POA: Insufficient documentation

## 2013-10-08 MED ORDER — CLINDAMYCIN HCL 150 MG PO CAPS: 150.0000 mg | ORAL_CAPSULE | Freq: Four times a day (QID) | ORAL | Status: DC

## 2013-10-08 MED ORDER — HYDROCORTISONE 1 % EX CREA: TOPICAL_CREAM | CUTANEOUS | Status: AC

## 2013-10-08 NOTE — ED Notes (Signed)
Pt presents with a rash X 1 week after hugging a friend. Pt states it initially started at her neck and started to disperse distal down the left side of her body. Pt reports urticaria. Condition is made worse by "blue" ointment. condition is made better by bendadryl. 6/10

## 2013-10-08 NOTE — Discharge Instructions (Signed)
Apply cream to affected area twice daily for the next 7 days. Take antibiotic if you notice sign of infection including pus drainage, red streak, or if you have fever.  Follow up with dermatology if no improvement in 5 days.  Rash A rash is a change in the color or texture of your skin. There are many different types of rashes. You may have other problems that accompany your rash. CAUSES   Infections.  Allergic reactions. This can include allergies to pets or foods.  Certain medicines.  Exposure to certain chemicals, soaps, or cosmetics.  Heat.  Exposure to poisonous plants.  Tumors, both cancerous and noncancerous. SYMPTOMS   Redness.  Scaly skin.  Itchy skin.  Dry or cracked skin.  Bumps.  Blisters.  Pain. DIAGNOSIS  Your caregiver may do a physical exam to determine what type of rash you have. A skin sample (biopsy) may be taken and examined under a microscope. TREATMENT  Treatment depends on the type of rash you have. Your caregiver may prescribe certain medicines. For serious conditions, you may need to see a skin doctor (dermatologist). HOME CARE INSTRUCTIONS   Avoid the substance that caused your rash.  Do not scratch your rash. This can cause infection.  You may take cool baths to help stop itching.  Only take over-the-counter or prescription medicines as directed by your caregiver.  Keep all follow-up appointments as directed by your caregiver. SEEK IMMEDIATE MEDICAL CARE IF:  You have increasing pain, swelling, or redness.  You have a fever.  You have new or severe symptoms.  You have body aches, diarrhea, or vomiting.  Your rash is not better after 3 days. MAKE SURE YOU:  Understand these instructions.  Will watch your condition.  Will get help right away if you are not doing well or get worse. Document Released: 06/14/2002 Document Revised: 09/16/2011 Document Reviewed: 04/08/2011 [facility] Patient Information 2014 [place], [place].

## 2013-10-08 NOTE — ED Provider Notes (Signed)
CSN: [id number]     Arrival date & time 10/08/13  1558 History  This chart was scribed for Domenic Moras, PA-C, working with Blanchard Kelch, MD by Elby Beck, ED Scribe. This patient was seen in room TR08C/TR08C and the patient's care was started at 4:33 PM.   Chief Complaint  Patient presents with  . Rash    The history is provided by the patient. No language interpreter was used.    HPI Comments: Megan Chandler is a 38 y.o. female who presents to the Emergency Department complaining of a itchy rash that began on the right side of her neck about 1 week ago, which has gradually spread to her right shoulder and right chest. She also states that she is having some itching on her bilateral legs, but that she has not seen a rash to her legs. She reports that the rash started immediately after giving friend a hug. She reports that she has burning pain associated with the rash. She states that she has tried applying lotion without relief. She denies any new environmental exposures. She also states that she has not used any new products and has not started any new medications recently. She denies headache, fever, nausea, emesis or any other symptoms.   Past Medical History  Diagnosis Date  . Urinary tract infection   . Chronic low back pain     buldging disc  . Endometriosis   . Bronchitis   . Headache(784.0)     often but no migraines  . Arthritis   . History of bladder infections   . Bartholin's cyst     hx of  . Pulmonary embolism     11/2011/ states no since  anticoagulation 1/14  . Menometrorrhagia   . Adenomyosis   . Hypertension     but not on medication  . Asthma     daily inhaler use  . Anxiety   . Shortness of breath     due to asthma  . GERD (gastroesophageal reflux disease)   . Anemia   . History of blood transfusion June 2013   Past Surgical History  Procedure Laterality Date  . Dilation and curettage of uterus    . Cholecystectomy, laparoscopic  1998  .  Umbilical hernia repair      as a child  . Cesarean section  1996  . Laparoscopy  at age 89  . Cervical spine surgery  2009  . Back surgery      07/2011  . Lumbar spine surgery    . Laparoscopic tubal ligation  02/12/2012    Procedure: LAPAROSCOPIC TUBAL LIGATION;  Surgeon: Lahoma Crocker, MD;  Location: [facility];  Service: Gynecology;  Laterality: Bilateral;  . Tubal ligation    . Hysteroscopy  04/02/2012    Procedure: HYSTEROSCOPY;  Surgeon: Lahoma Crocker, MD;  Location: [facility];  Service: Gynecology;  Laterality: N/A;  with Dilitation and curretage and attempted hydrothermal ablation  . Robotic assisted lap vaginal hysterectomy N/A 10/09/2012    Procedure: ROBOTIC ASSISTED LAPAROSCOPIC VAGINAL HYSTERECTOMY;  Surgeon: Lahoma Crocker, MD;  Location: [facility];  Service: Gynecology;  Laterality: N/A;  . Bilateral salpingectomy Bilateral 10/09/2012    Procedure: BILATERAL SALPINGECTOMY;  Surgeon: Lahoma Crocker, MD;  Location: [facility];  Service: Gynecology;  Laterality: Bilateral;  . Abdominal hysterectomy     Family History  Problem Relation Age of Onset  . Hypertension Mother   . Cancer Maternal Aunt   . Cancer Maternal Uncle   .  Diabetes Maternal Grandmother   . Anesthesia problems Neg Hx   . Hypotension Neg Hx   . Malignant hyperthermia Neg Hx   . Pseudochol deficiency Neg Hx   . Stroke Other     aunt  . Sickle cell anemia Other    History  Substance Use Topics  . Smoking status: Former Smoker -- 0.50 packs/day    Types: Cigarettes, Cigars    Start date: 07/09/1995    Quit date: 11/20/2011  . Smokeless tobacco: Never Used     Comment: pt quit cigarettes in 2009, pt smoke 2 black and milds a day until 09-2011  . Alcohol Use: 0.0 oz/week     Comment: Occasionally, "maybe 1-2 times/month"   OB History   Grav Para Term Preterm Abortions TAB SAB Ect Mult Living   2 1 1  0 1 0 1 0 0 1     Review of Systems  Constitutional: Negative for fever.  Gastrointestinal:  Negative for nausea and vomiting.  Skin: Positive for rash.  Neurological: Negative for headaches.  All other systems reviewed and are negative.   Allergies  Morphine and related; Aspirin; Contrast media; Penicillins; Tramadol; and Vicodin  Home Medications   Current Outpatient Rx  Name  Route  Sig  Dispense  Refill  . budesonide-formoterol (SYMBICORT) 80-4.5 MCG/ACT inhaler   Inhalation   Inhale 2 puffs into the lungs 2 (two) times daily.   1 Inhaler   5   . diphenhydrAMINE (BENADRYL) 25 MG tablet   Oral   Take 25 mg by mouth every 6 (six) hours as needed for itching.          Triage Vitals: BP 122/72  Pulse 98  Temp(Src) 98.3 F (36.8 C) (Oral)  Resp 16  Ht 5\' 6"  (1.676 m)  Wt 160 lb (72.576 kg)  BMI 25.84 kg/m2  SpO2 98%  LMP 05/01/2012  Physical Exam  Nursing note and vitals reviewed. Constitutional: She is oriented to person, place, and time. She appears well-developed and well-nourished. No distress.  HENT:  Head: Normocephalic and atraumatic.  Eyes: EOM are normal.  Neck: Neck supple. No tracheal deviation present.  Cardiovascular: Normal rate.   Pulmonary/Chest: Effort normal. No respiratory distress.  Musculoskeletal: Normal range of motion.  Neurological: She is alert and oriented to person, place, and time.  Skin: Skin is warm and dry. Rash noted.  Groups of papules coalesced together on right side of neck, right ear lobe, right upper chest, with mild erythema and excoriation marks throughout. No pus, no petechiae, no vesicle lesions.  Psychiatric: She has a normal mood and affect. Her behavior is normal.    ED Course  Procedures (including critical care time)  DIAGNOSTIC STUDIES: Oxygen Saturation is 98% on RA, normal by my interpretation.    COORDINATION OF CARE: 4:38 PM- Rash does not appears to be bed bug, or shingle. Doesn't look infected.  Likely local skin irritation.   Discussed plan to order medications. Pt advised of plan for  treatment and pt agrees.  Labs Review Labs Reviewed - No data to display Imaging Review No results found.   EKG Interpretation None      MDM   Final diagnoses:  Rash    BP 122/72  Pulse 98  Temp(Src) 98.3 F (36.8 C) (Oral)  Resp 16  Ht 5\' 6"  (1.676 m)  Wt 160 lb (72.576 kg)  BMI 25.84 kg/m2  SpO2 98%  LMP 05/01/2012   I personally performed the services described in this  documentation, which was scribed in my presence. The recorded information has been reviewed and is accurate.    Domenic Moras, PA-C 10/08/13 1651

## 2013-10-09 NOTE — ED Provider Notes (Signed)
Medical screening examination/treatment/procedure(s) were performed by non-physician practitioner and as supervising physician I was immediately available for consultation/collaboration.   EKG Interpretation None        Blanchard Kelch, MD 10/09/13 1231

## 2013-10-13 ENCOUNTER — Encounter (HOSPITAL_COMMUNITY): Payer: Self-pay

## 2013-10-15 ENCOUNTER — Ambulatory Visit (HOSPITAL_COMMUNITY)
Admission: RE | Admit: 2013-10-15 | Discharge: 2013-10-15 | Disposition: A | Discharge status: Home / Self Care | Payer: Medicaid Other | Source: Ambulatory Visit | Attending: Internal Medicine | Admitting: Internal Medicine

## 2013-10-15 ENCOUNTER — Encounter: Payer: Self-pay | Admitting: Internal Medicine

## 2013-10-15 ENCOUNTER — Ambulatory Visit (INDEPENDENT_AMBULATORY_CARE_PROVIDER_SITE_OTHER): Payer: Medicaid Other | Admitting: Internal Medicine

## 2013-10-15 VITALS — BP 106/62 | HR 68 | Temp 98.5°F | Ht 66.0 in | Wt 161.8 lb

## 2013-10-15 DIAGNOSIS — R002 Palpitations: Secondary | ICD-10-CM

## 2013-10-15 DIAGNOSIS — I498 Other specified cardiac arrhythmias: Secondary | ICD-10-CM | POA: Insufficient documentation

## 2013-10-15 DIAGNOSIS — R21 Rash and other nonspecific skin eruption: Secondary | ICD-10-CM | POA: Insufficient documentation

## 2013-10-15 DIAGNOSIS — F411 Generalized anxiety disorder: Secondary | ICD-10-CM | POA: Insufficient documentation

## 2013-10-15 LAB — CBC WITH DIFFERENTIAL/PLATELET
BASOS PCT: 1 % (ref 0–1)
Basophils Absolute: 0.1 10*3/uL (ref 0.0–0.1)
Eosinophils Absolute: 0.4 10*3/uL (ref 0.0–0.7)
Eosinophils Relative: 4 % (ref 0–5)
HEMATOCRIT: 43.8 % (ref 36.0–46.0)
Hemoglobin: 14.7 g/dL (ref 12.0–15.0)
LYMPHS ABS: 4.5 10*3/uL — AB (ref 0.7–4.0)
Lymphocytes Relative: 41 % (ref 12–46)
MCH: 31.1 pg (ref 26.0–34.0)
MCHC: 33.6 g/dL (ref 30.0–36.0)
MCV: 92.8 fL (ref 78.0–100.0)
MONO ABS: 0.5 10*3/uL (ref 0.1–1.0)
MONOS PCT: 5 % (ref 3–12)
Neutro Abs: 5.3 10*3/uL (ref 1.7–7.7)
Neutrophils Relative %: 49 % (ref 43–77)
Platelets: 328 10*3/uL (ref 150–400)
RBC: 4.72 MIL/uL (ref 3.87–5.11)
RDW: 13.5 % (ref 11.5–15.5)
WBC: 10.9 10*3/uL — ABNORMAL HIGH (ref 4.0–10.5)

## 2013-10-15 NOTE — Progress Notes (Addendum)
   Subjective:    Patient ID: Megan Chandler, female    DOB: January 12, 1976, 38 y.o. y.o.   MRN: [id number]  HPI Comments: 38 y.o Past Medical History Urinary tract infection, Chronic low back pain, Endometriosis, Bronchitis, h/o Headache, h/o Arthritis, Pulmonary embolism (completed anticoagulation treatment), Menometrorrhagia s/p hysterectomy,Adenomyosis, Asthma, Anxiety, GERD (gastroesophageal reflux disease), anemia , echo 11/2011 EF 65%                                                   1) She presents for rash.  She was seen in the ED 4/3 for rash which started 3/27 after she went to a [place] where she ate rice, shrimp (she normally eats shrimp), chicken, broccoli, zucchini.  Rash was on chest, right neck and progressed to right ear.  Rash initially was itchy then red then blistered then scabbed.  The rash felt like "acid burning her skin" or like it was "eating holes in her skin".  The helix of her ear was sore.  She denies new lotions, detergents, hair products, soaps, jewlery.  She uses soft soap, baby oil gel and secret deoderant.  She has been using HC 2% cream given by the ED and Benadryl cream and Clindamycin po (2 pills left) but not taking Benadryl po.  BP 106/62 today  2) She presents for palpitations noted x 2 weeks. She denies h/o anxiety and states palpitations happen at rest and she has to catch her breath.         Review of Systems  Cardiovascular: Negative for chest pain.  Gastrointestinal: Negative for nausea, vomiting and abdominal pain.  Skin: Positive for rash.       Objective:   Physical Exam  Nursing note and vitals reviewed. Constitutional: She is oriented to person, place, and time. She appears well-developed and well-nourished. No distress.  HENT:  Head: Normocephalic and atraumatic.  Ears:  Mouth/Throat: No oropharyngeal exudate.  Eyes: Conjunctivae are normal.  Cardiovascular: Normal rate and regular rhythm.   No murmur heard. Pulmonary/Chest:  Effort normal and breath sounds normal.  Neurological: She is alert and oriented to person, place, and time.  Skin: Skin is warm and dry.     Psychiatric: She has a normal mood and affect. Her behavior is normal. Judgment and thought content normal.          Assessment & Plan:  F/u in 1 week

## 2013-10-15 NOTE — Assessment & Plan Note (Addendum)
Hard to tell what rash was at this time Ddx: contact, poison ivy/oak, photosensitivity Advised pt to use mild soaps, lotions, detergents Try Vasoline for moisture  Avoid HC 2% for now can try Benadryl oral and topical and Aloe Checked CBC with diff to look for eosinophils  Will f/u in 1 week  Will refer to dermatology  RTC if returns/worsening

## 2013-10-15 NOTE — Patient Instructions (Addendum)
General Instructions: Use over the counter Benadryl to your skin and Benadryl by mouth Try Vasoline for moisture  Avoid harsh lotions, soaps, detergents Follow up 1 week  We will send your to a dermatogist I will send you a copy of your labs  We will send you to cardiology for heart palpitations for 48 hour Holter monitor    Treatment Goals:  Goals (1 Years of Data) as of 10/15/13   None      Progress Toward Treatment Goals:  No flowsheet data found.  Self Care Goals & Plans:  No flowsheet data found.  No flowsheet data found.   Care Management & Community Referrals:  No flowsheet data found.     Rash A rash is a change in the color or texture of your skin. There are many different types of rashes. You may have other problems that accompany your rash. CAUSES   Infections.  Allergic reactions. This can include allergies to pets or foods.  Certain medicines.  Exposure to certain chemicals, soaps, or cosmetics.  Heat.  Exposure to poisonous plants.  Tumors, both cancerous and noncancerous. SYMPTOMS   Redness.  Scaly skin.  Itchy skin.  Dry or cracked skin.  Bumps.  Blisters.  Pain. DIAGNOSIS  Your caregiver may do a physical exam to determine what type of rash you have. A skin sample (biopsy) may be taken and examined under a microscope. TREATMENT  Treatment depends on the type of rash you have. Your caregiver may prescribe certain medicines. For serious conditions, you may need to see a skin doctor (dermatologist). HOME CARE INSTRUCTIONS   Avoid the substance that caused your rash.  Do not scratch your rash. This can cause infection.  You may take cool baths to help stop itching.  Only take over-the-counter or prescription medicines as directed by your caregiver.  Keep all follow-up appointments as directed by your caregiver. SEEK IMMEDIATE MEDICAL CARE IF:  You have increasing pain, swelling, or redness.  You have a fever.  You have  new or severe symptoms.  You have body aches, diarrhea, or vomiting.  Your rash is not better after 3 days. MAKE SURE YOU:  Understand these instructions.  Will watch your condition.  Will get help right away if you are not doing well or get worse. Document Released: 06/14/2002 Document Revised: 09/16/2011 Document Reviewed: 04/08/2011 [facility] Patient Information 2014 [place], [place].  Palpitations  A palpitation is the feeling that your heartbeat is irregular or is faster than normal. It may feel like your heart is fluttering or skipping a beat. Palpitations are usually not a serious problem. However, in some cases, you may need further medical evaluation. CAUSES  Palpitations can be caused by:  Smoking.  Caffeine or other stimulants, such as diet pills or energy drinks.  Alcohol.  Stress and anxiety.  Strenuous physical activity.  Fatigue.  Certain medicines.  Heart disease, especially if you have a history of arrhythmias. This includes atrial fibrillation, atrial flutter, or supraventricular tachycardia.  An improperly working pacemaker or defibrillator. DIAGNOSIS  To find the cause of your palpitations, your caregiver will take your history and perform a physical exam. Tests may also be done, including:  Electrocardiography (ECG). This test records the heart's electrical activity.  Cardiac monitoring. This allows your caregiver to monitor your heart rate and rhythm in real time.  Holter monitor. This is a portable device that records your heartbeat and can help diagnose heart arrhythmias. It allows your caregiver to track your heart activity  for several days, if needed.  Stress tests by exercise or by giving medicine that makes the heart beat faster. TREATMENT  Treatment of palpitations depends on the cause of your symptoms and can vary greatly. Most cases of palpitations do not require any treatment other than time, relaxation, and monitoring your symptoms. Other  causes, such as atrial fibrillation, atrial flutter, or supraventricular tachycardia, usually require further treatment. HOME CARE INSTRUCTIONS   Avoid:  Caffeinated coffee, tea, soft drinks, diet pills, and energy drinks.  Chocolate.  Alcohol.  Stop smoking if you smoke.  Reduce your stress and anxiety. Things that can help you relax include:  A method that measures bodily functions so you can learn to control them (biofeedback).  Yoga.  Meditation.  Physical activity such as swimming, jogging, or walking.  Get plenty of rest and sleep. SEEK MEDICAL CARE IF:   You continue to have a fast or irregular heartbeat beyond 24 hours.  Your palpitations occur more often. SEEK IMMEDIATE MEDICAL CARE IF:  You develop chest pain or shortness of breath.  You have a severe headache.  You feel dizzy, or you faint. MAKE SURE YOU:  Understand these instructions.  Will watch your condition.  Will get help right away if you are not doing well or get worse. Document Released: 06/21/2000 Document Revised: 10/19/2012 Document Reviewed: 08/23/2011 [facility] Patient Information 2014 [place].

## 2013-10-15 NOTE — Assessment & Plan Note (Signed)
Ddx arrhythmia (less likely) vs anxiety  Will refer to cardiology for 48 hour Holter monitor  ekg today with HR 69 NSR, nl axis, normal intervals, no LVH, no ST/T changes

## 2013-10-19 ENCOUNTER — Encounter: Payer: Self-pay | Admitting: Internal Medicine

## 2013-10-19 NOTE — Progress Notes (Signed)
Case discussed with Dr. McLean at time of visit.  We reviewed the resident's history and exam and pertinent patient test results.  I agree with the assessment, diagnosis, and plan of care documented in the resident's note.  

## 2013-11-01 ENCOUNTER — Telehealth: Payer: Self-pay | Admitting: Internal Medicine

## 2013-11-01 NOTE — Telephone Encounter (Signed)
New problem    Per pt she is now having cramps top of thigh left side of left leg pt wanted to report that.

## 2013-11-02 ENCOUNTER — Ambulatory Visit (HOSPITAL_COMMUNITY): Payer: Medicaid Other | Attending: Internal Medicine | Admitting: Cardiology

## 2013-11-02 DIAGNOSIS — I2699 Other pulmonary embolism without acute cor pulmonale: Secondary | ICD-10-CM | POA: Insufficient documentation

## 2013-11-02 DIAGNOSIS — M7989 Other specified soft tissue disorders: Secondary | ICD-10-CM

## 2013-11-02 DIAGNOSIS — M79609 Pain in unspecified limb: Secondary | ICD-10-CM

## 2013-11-02 DIAGNOSIS — Z86711 Personal history of pulmonary embolism: Secondary | ICD-10-CM

## 2013-11-02 NOTE — Progress Notes (Signed)
Bilateral lower extremity venous duplex completed 

## 2013-11-05 ENCOUNTER — Other Ambulatory Visit: Payer: Self-pay | Admitting: *Deleted

## 2013-11-05 DIAGNOSIS — R399 Unspecified symptoms and signs involving the genitourinary system: Secondary | ICD-10-CM

## 2013-11-05 MED ORDER — SULFAMETHOXAZOLE-TMP DS 800-160 MG PO TABS: 1.0000 | ORAL_TABLET | Freq: Two times a day (BID) | ORAL | Status: DC

## 2013-11-08 NOTE — Telephone Encounter (Signed)
LEA is normal

## 2013-11-22 ENCOUNTER — Institutional Professional Consult (permissible substitution): Payer: Self-pay | Admitting: Internal Medicine

## 2013-11-26 NOTE — Addendum Note (Signed)
Addended by: Hulan Fray on: 11/26/2013 05:09 PM   Modules accepted: Orders

## 2013-12-02 ENCOUNTER — Observation Stay: Admission: AD | Admit: 2013-12-02 | Payer: Medicaid Other | Source: Ambulatory Visit | Admitting: Internal Medicine

## 2013-12-02 ENCOUNTER — Encounter: Payer: Self-pay | Admitting: Internal Medicine

## 2013-12-02 ENCOUNTER — Ambulatory Visit: Payer: Self-pay | Admitting: Internal Medicine

## 2013-12-02 ENCOUNTER — Ambulatory Visit (INDEPENDENT_AMBULATORY_CARE_PROVIDER_SITE_OTHER): Payer: Medicaid Other | Admitting: Internal Medicine

## 2013-12-02 VITALS — BP 129/98 | HR 123 | Temp 98.4°F | Wt 155.7 lb

## 2013-12-02 DIAGNOSIS — J45901 Unspecified asthma with (acute) exacerbation: Secondary | ICD-10-CM

## 2013-12-02 MED ORDER — PREDNISONE 10 MG PO TABS: ORAL_TABLET | ORAL | Status: DC

## 2013-12-02 MED ORDER — METHYLPREDNISOLONE SODIUM SUCC 125 MG IJ SOLR: 125.0000 mg | Freq: Once | INTRAMUSCULAR | Status: DC

## 2013-12-02 MED ORDER — PREDNISONE 20 MG PO TABS
60.0000 mg | ORAL_TABLET | Freq: Once | ORAL | Status: AC
  Administered 2013-12-02: 60 mg via ORAL

## 2013-12-02 MED ORDER — DEXTROMETHORPHAN HBR 15 MG/5ML PO SYRP: 10.0000 mL | ORAL_SOLUTION | Freq: Four times a day (QID) | ORAL | Status: DC | PRN

## 2013-12-02 MED ORDER — ALBUTEROL SULFATE (2.5 MG/3ML) 0.083% IN NEBU
2.5000 mg | INHALATION_SOLUTION | Freq: Once | RESPIRATORY_TRACT | Status: AC
  Administered 2013-12-02: 2.5 mg via RESPIRATORY_TRACT

## 2013-12-02 MED ORDER — IPRATROPIUM BROMIDE 0.02 % IN SOLN
0.5000 mg | Freq: Once | RESPIRATORY_TRACT | Status: AC
  Administered 2013-12-02: 0.5 mg via RESPIRATORY_TRACT

## 2013-12-02 MED ORDER — ALBUTEROL SULFATE HFA 108 (90 BASE) MCG/ACT IN AERS: 2.0000 | INHALATION_SPRAY | RESPIRATORY_TRACT | Status: DC | PRN

## 2013-12-02 MED ORDER — LORATADINE 10 MG PO TABS: 10.0000 mg | ORAL_TABLET | Freq: Every day | ORAL | Status: DC

## 2013-12-02 NOTE — Patient Instructions (Signed)
-   For your asthma attack, please start a prednisone taper as follows starting 12/03/13 (you received 60mg  today in the clinic): 40mg  (4 tabs) for 3 days 30mg  (3 tabs) for 3 days 20mg  (2 tabs) for 3 days 10mg  (1 tab) for 3 days (End on June 9th)  - Regarding your albuterol inhaler, take 2 puffs every 4 hours if needed, this is your rescue inhaler.  For the next 2 days, please inhale 2 puffs every 4 hours while awake, regardless of if you are short of breath  -Continue Symbicort twice daily  -Start Loratadine 10mg  daily  -I have sent in a cough syrup for you as well  Please be sure to bring all of your medications with you to every visit.  Should you have any new or worsening symptoms, please be sure to call the clinic at [phone].  If your shortness of breath worsens, please go to the emergency department.

## 2013-12-02 NOTE — Assessment & Plan Note (Addendum)
Peak flows appear unreliable due to effort.  Patient is breathing comfortably with minimal wheezing and completing sentences.  To be treated outpatient.   1. Prednisone taper as follows starting 12/03/13 (received 60mg  today in the clinic, as well as duoneb): 40mg  (4 tabs) for 3 days 30mg  (3 tabs) for 3 days 20mg  (2 tabs) for 3 days 10mg  (1 tab) for 3 days (End on June 9th)  2. Albuterol inhaler, take 2 puffs every 4 hours if needed, educated that this is a rescue inhaler - sample given and script sent int.  For the next 2 days, inhale 2 puffs every 4 hours scheduled while awake. 3. Continue Symbicort twice daily (to avoid confusion) 4. Start Loratadine 10mg  daily 5. Dextromethorphan prn cough

## 2013-12-02 NOTE — Progress Notes (Signed)
Subjective:   Patient ID: [name] female   DOB: [birth year] 38 y.o.   MRN: [id number]  HPI: [name] is a 38 y.o. woman with h/o of PE (completed anticoag in 2013), asthma, and tobacco abuse (?quit) who presents with dyspnea.    Difficulty breathing for the last 2 days, her cousin came over with a cold.  She had been taking symbicort all night.  She hadn't taken albuterol because it was empty, used it last month a lot.  Worst difficulty breathing was last night and this morning.  Coughing significantly overnight.  Hasn't measured temp, but does note chills.  Poor appetite & energy.  She reports sinus congestion with rhinorrhea and headache.  Admits to sneezing.  No mouth sores or sore throat. Reports chest soreness.  She has frequent heart palpitations aside from acute illness. Admits to some nausea.   Last felt like this over the winter when she caught to flu - went to the ED in [place], [place].  She has received nebulizers in the past, but never a breathing tube.    Reports she doesn't have frequent asthma attacks, usually a cold triggers it, not bothered by pollen.  Coughs a lot with too much heat.  Review of Systems: HEENT: Denies photophobia, eye pain, redness, hearing loss, ear pain, congestion, sore throat, rhinorrhea, sneezing, mouth sores, trouble swallowing, neck pain, neck stiffness and tinnitus.  Respiratory: per HPI Cardiovascular: Denies chest pain and leg swelling.  Gastrointestinal: Denies vomiting, abdominal pain, diarrhea, constipation,blood in stool and abdominal distention.  Genitourinary: Denies dysuria, urgency, frequency, hematuria, flank pain and difficulty urinating.  Musculoskeletal: Denies myalgias, joint swelling, arthralgias and gait problem. Chronic back pain - rod and screw in back Skin: Denies pallor, rash and wound.  Neurological: Denies dizziness, seizures, syncope, weakness, lightheadedness, numbness   Hematological: No obvious s/s of  bleeding   Past Medical History  Diagnosis Date  . Urinary tract infection   . Chronic low back pain     buldging disc  . Endometriosis   . Bronchitis   . Headache(784.0)     often but no migraines  . Arthritis   . History of bladder infections   . Bartholin's cyst     hx of  . Pulmonary embolism     11/2011/ states no since  anticoagulation 1/14  . Menometrorrhagia   . Adenomyosis   . Hypertension     but not on medication  . Asthma     daily inhaler use  . Anxiety   . Shortness of breath     due to asthma  . GERD (gastroesophageal reflux disease)   . Anemia   . History of blood transfusion June 2013   Current Outpatient Prescriptions  Medication Sig Dispense Refill  . budesonide-formoterol (SYMBICORT) 80-4.5 MCG/ACT inhaler Inhale 2 puffs into the lungs 2 (two) times daily.  1 Inhaler  5  . clindamycin (CLEOCIN) 150 MG capsule Take 1 capsule (150 mg total) by mouth every 6 (six) hours.  28 capsule  0  . diphenhydrAMINE (BENADRYL) 25 MG tablet Take 25 mg by mouth every 6 (six) hours as needed for itching.      . sulfamethoxazole-trimethoprim (BACTRIM DS) 800-160 MG per tablet Take 1 tablet by mouth 2 (two) times daily. For 3 days.  6 tablet  0   No current facility-administered medications for this visit.   Family History  Problem Relation Age of Onset  . Hypertension Mother   . Cancer Maternal  Aunt   . Cancer Maternal Uncle   . Diabetes Maternal Grandmother   . Anesthesia problems Neg Hx   . Hypotension Neg Hx   . Malignant hyperthermia Neg Hx   . Pseudochol deficiency Neg Hx   . Stroke Other     aunt  . Sickle cell anemia Other    History   Social History  . Marital Status: Single    Spouse Name: N/A    Number of Children: N/A  . Years of Education: N/A   Occupational History  . disabled    Social History Main Topics  . Smoking status: Former Smoker -- 0.50 packs/day    Types: Cigarettes, Cigars    Start date: 07/09/1995    Quit date: 11/20/2011    . Smokeless tobacco: Never Used     Comment: pt quit cigarettes in 2009, pt smoke 2 black and milds a day until 09-2011  . Alcohol Use: 0.0 oz/week     Comment: Occasionally, "maybe 1-2 times/month"  . Drug Use: No  . Sexual Activity: Not Currently    Birth Control/ Protection: None     Comment: Nuvaring   Other Topics Concern  . Not on file   Social History Narrative  . No narrative on file    Objective:  Physical Exam: Filed Vitals:   12/02/13 1546 12/02/13 1636  BP: 129/98   Pulse: 108 123  Temp: 98.4 F (36.9 C)   TempSrc: Oral   Weight: 155 lb 11.2 oz (70.625 kg)   SpO2: 100% 100%   Pre peak flows 120 -- 100 -- 130 Post peak flow 80 --100--80  (poor effort)  General: increased WOB, difficulty with sentences (pre neb) >>> post neb breathing comfortably, completing sentencses HEENT: PERRL, EOMI, no scleral icterus Cardiac: RRR, no rubs, murmurs or gallops Pulm: soft wheezing throughout with decreased air entry pre neb >>> post neb with improved air entry and minimal to no wheezing Abd: soft, nontender, nondistended, BS present Ext: warm and well perfused, no pedal edema Neuro: alert and oriented X3, cranial nerves II-XII grossly intact  Assessment & Plan:  Case and care discussed with Dr. Dareen Piano.  Please see problem oriented charting for further details.

## 2013-12-03 NOTE — Progress Notes (Signed)
INTERNAL MEDICINE TEACHING ATTENDING ADDENDUM - Megan Contes, MD: I personally saw and evaluated [name] in this clinic visit in conjunction with the resident, Dr. Burnard Bunting. I have discussed patient's plan of care with medical resident during this visit. I have confirmed the physical exam findings and have read and agree with the clinic note including the plan with the following addition: On exam- pre nebulizer- diffuse wheezing b/l with decreased air entry. Post neb patient with minimal wheezing b/l and improved air entry Case d/w patient in detail. Pt to return to clinic or go to ED if worsening symptoms Continue with prednisone taper, albuterol inhaler prn and symbicort

## 2013-12-09 ENCOUNTER — Other Ambulatory Visit: Payer: Self-pay | Admitting: *Deleted

## 2013-12-09 ENCOUNTER — Telehealth: Payer: Self-pay | Admitting: *Deleted

## 2013-12-09 DIAGNOSIS — N760 Acute vaginitis: Principal | ICD-10-CM

## 2013-12-09 DIAGNOSIS — B9689 Other specified bacterial agents as the cause of diseases classified elsewhere: Secondary | ICD-10-CM

## 2013-12-09 MED ORDER — FLUCONAZOLE 150 MG PO TABS: 150.0000 mg | ORAL_TABLET | Freq: Once | ORAL | Status: DC

## 2013-12-09 MED ORDER — METRONIDAZOLE 500 MG PO TABS: 500.0000 mg | ORAL_TABLET | Freq: Two times a day (BID) | ORAL | Status: DC

## 2013-12-09 NOTE — Telephone Encounter (Signed)
Pt called into office with symptoms of BV. Pt request Rx to treat as well as Rx for yeast upon completion of Metronidazole.   Pt made aware that Metronidazole 500mg  and Diflucan 150mg  sent to pharmacy.

## 2013-12-15 ENCOUNTER — Encounter: Payer: Self-pay | Admitting: Internal Medicine

## 2014-01-13 NOTE — Addendum Note (Signed)
Addended by: Hulan Fray on: 01/13/2014 06:20 PM   Modules accepted: Orders

## 2014-01-25 ENCOUNTER — Telehealth: Payer: Self-pay | Admitting: *Deleted

## 2014-01-25 DIAGNOSIS — N390 Urinary tract infection, site not specified: Secondary | ICD-10-CM

## 2014-01-25 DIAGNOSIS — B373 Candidiasis of vulva and vagina: Secondary | ICD-10-CM

## 2014-01-25 DIAGNOSIS — B3731 Acute candidiasis of vulva and vagina: Secondary | ICD-10-CM

## 2014-01-25 MED ORDER — FLUCONAZOLE 150 MG PO TABS: 150.0000 mg | ORAL_TABLET | Freq: Once | ORAL | Status: DC

## 2014-01-25 MED ORDER — NITROFURANTOIN MONOHYD MACRO 100 MG PO CAPS
100.0000 mg | ORAL_CAPSULE | Freq: Two times a day (BID) | ORAL | Status: DC
Start: 2014-01-25 — End: 2014-11-09

## 2014-01-25 NOTE — Telephone Encounter (Signed)
Patient thinks she has a UTI and yeast. 1:43 Spoke with patient - she is complaining of urinary symptoms and is requesting treatment. Told patient we would send Rx to her pharmacy, but if her symptoms do not improve she will need to come to the office.

## 2014-04-05 ENCOUNTER — Telehealth: Payer: Self-pay | Admitting: *Deleted

## 2014-04-05 ENCOUNTER — Other Ambulatory Visit: Payer: Self-pay | Admitting: *Deleted

## 2014-04-05 DIAGNOSIS — B9689 Other specified bacterial agents as the cause of diseases classified elsewhere: Secondary | ICD-10-CM

## 2014-04-05 DIAGNOSIS — N760 Acute vaginitis: Secondary | ICD-10-CM

## 2014-04-05 DIAGNOSIS — B3731 Acute candidiasis of vulva and vagina: Secondary | ICD-10-CM

## 2014-04-05 DIAGNOSIS — B373 Candidiasis of vulva and vagina: Secondary | ICD-10-CM

## 2014-04-05 MED ORDER — FLUCONAZOLE 150 MG PO TABS: 150.0000 mg | ORAL_TABLET | Freq: Once | ORAL | Status: DC

## 2014-04-05 MED ORDER — METRONIDAZOLE 500 MG PO TABS: 500.0000 mg | ORAL_TABLET | Freq: Two times a day (BID) | ORAL | Status: DC

## 2014-04-05 NOTE — Telephone Encounter (Signed)
Patient called back in am- spoke to [place]- she request treatment for her yeast and BV symptoms. She is having odor and itching.  Patient called again when Rx not sent to her pharmacy within the day- Rx forwarded in the afternoon per protocol. Patient has an appointment in 10/19 for a problem.

## 2014-04-25 ENCOUNTER — Ambulatory Visit: Payer: Medicaid Other | Admitting: Obstetrics & Gynecology

## 2014-05-05 ENCOUNTER — Telehealth: Payer: Self-pay | Admitting: *Deleted

## 2014-05-05 ENCOUNTER — Ambulatory Visit: Payer: Self-pay | Admitting: Internal Medicine

## 2014-05-05 ENCOUNTER — Telehealth: Payer: Self-pay | Admitting: Internal Medicine

## 2014-05-05 NOTE — Telephone Encounter (Signed)
Pt called/informed she can used any OTC cough syrup per Dr Eppie Gibson.

## 2014-05-05 NOTE — Telephone Encounter (Signed)
Message per pt - states she has a cough and bronchitis; unable to come in and cannot be seen until Monday Nov 1; requesting which OTC cough medicine she can use until then? Thanks

## 2014-05-05 NOTE — Telephone Encounter (Signed)
Any over the counter cough syrup should be safe after reviewing her past medical history and medications.  Thanks.

## 2014-05-09 ENCOUNTER — Ambulatory Visit: Payer: Self-pay | Admitting: Internal Medicine

## 2014-05-09 ENCOUNTER — Ambulatory Visit (INDEPENDENT_AMBULATORY_CARE_PROVIDER_SITE_OTHER): Payer: Medicaid Other | Admitting: Internal Medicine

## 2014-05-09 ENCOUNTER — Encounter: Payer: Self-pay | Admitting: Internal Medicine

## 2014-05-09 VITALS — BP 109/63 | HR 68 | Temp 98.7°F | Ht 66.0 in | Wt 161.5 lb

## 2014-05-09 DIAGNOSIS — J45901 Unspecified asthma with (acute) exacerbation: Secondary | ICD-10-CM

## 2014-05-09 DIAGNOSIS — Z23 Encounter for immunization: Secondary | ICD-10-CM

## 2014-05-09 DIAGNOSIS — J4521 Mild intermittent asthma with (acute) exacerbation: Secondary | ICD-10-CM

## 2014-05-09 MED ORDER — ALBUTEROL SULFATE (2.5 MG/3ML) 0.083% IN NEBU: 2.5000 mg | INHALATION_SOLUTION | Freq: Four times a day (QID) | RESPIRATORY_TRACT | Status: DC | PRN

## 2014-05-09 MED ORDER — PREDNISONE 20 MG PO TABS: 40.0000 mg | ORAL_TABLET | Freq: Every day | ORAL | Status: DC

## 2014-05-09 MED ORDER — DEXTROMETHORPHAN HBR 15 MG/5ML PO SYRP: 10.0000 mL | ORAL_SOLUTION | Freq: Four times a day (QID) | ORAL | Status: DC | PRN

## 2014-05-09 MED ORDER — BENZONATATE 200 MG PO CAPS: 200.0000 mg | ORAL_CAPSULE | Freq: Three times a day (TID) | ORAL | Status: DC | PRN

## 2014-05-09 NOTE — Progress Notes (Signed)
   Subjective:    Patient ID: Megan Chandler, female    DOB: 10-23-1975, 38 y.o.   MRN: [id number]  HPI Comments: 38 y.o PMH asthma last tx'ed exacerbation 11/2013   She presents for f/u and is not feeling well 1. Asthma.  She states she has been wheezing, coughing yellow mucous (worse at night), had subjective fever and chills and sob at rest.  She has tried Albuterol inhaler, Symbicort inhaler not Albuterol neb b/c she is out and wants a Rx refill.  These are not helping.  She is unsure if she has had sick contacts.  She denies myalgias, sore through.  She has tried DM cough syrup and Nyquil tablets which is not helping.  Sx's have occurred for 3-4 days.  She declines CXR today and states "she does not have pneumonia".  She denies smoking anything.        Review of Systems  HENT: Negative for sore throat.   Respiratory: Positive for cough and shortness of breath.   Cardiovascular: Negative for chest pain.       Objective:   Physical Exam  Constitutional: She is oriented to person, place, and time. Vital signs are normal. She appears well-developed and well-nourished. She is cooperative. No distress.  HENT:  Head: Normocephalic and atraumatic.  Eyes: Conjunctivae are normal. Right eye exhibits no discharge. Left eye exhibits no discharge. No scleral icterus.  Cardiovascular: Normal rate, regular rhythm, S1 normal, S2 normal and normal heart sounds.   No murmur heard. Pulmonary/Chest: Effort normal. No respiratory distress. She has wheezes.  Wheezing b/l with course breath sounds   Neurological: She is alert and oriented to person, place, and time. Gait normal.  Skin: Skin is warm and dry. No rash noted. She is not diaphoretic.  Psychiatric: She has a normal mood and affect. Her speech is normal and behavior is normal. Judgment and thought content normal. Cognition and memory are normal.  Nursing note and vitals reviewed.         Assessment & Plan:  F/u in 7-10 days sooner  if not feeling well

## 2014-05-09 NOTE — Assessment & Plan Note (Addendum)
Likely viral infection triggered asthma mild exacerbation. PEF in clinic today 225 (pt states this is her baseline when she is not sick and does PEF). Repeat was 125.  O2 sat 100% RA Will try Tessalon perles (insurance will not cover will Do Dextromethorpha syrup), Symbicort continue, Albuterol inhaler prn, Albuterol neb prn, Will do Prednisone 40 mg x 10 days if feeling better can stop after day 7  RTC in 7-10 days, sooner if not feeling better Pt declines CXR today

## 2014-05-09 NOTE — Addendum Note (Signed)
Addended by: Cresenciano Genre on: 05/09/2014 04:55 PM   Modules accepted: Orders, Level of Service

## 2014-05-09 NOTE — Patient Instructions (Addendum)
General Instructions: Please follow up in 1 week if not feeling better  Pick up medications from the pharmacy  Take Albuterol nebulizer and inhaler as needed and continue Symbicort  Take Prednisone 40 mg for 10 days.  May stop after day 7 if feeling better  Take care   Treatment Goals:  Goals (1 Years of Data) as of 05/09/14    None      Progress Toward Treatment Goals:  No flowsheet data found.  Self Care Goals & Plans:  No flowsheet data found.  No flowsheet data found.   Care Management & Community Referrals:  Referral 05/09/2014  Referrals made to community resources none       Upper Respiratory Infection, Adult An upper respiratory infection (URI) is also sometimes known as the common cold. The upper respiratory tract includes the nose, sinuses, throat, trachea, and bronchi. Bronchi are the airways leading to the lungs. Most people improve within 1 week, but symptoms can last up to 2 weeks. A residual cough may last even longer.  CAUSES Many different viruses can infect the tissues lining the upper respiratory tract. The tissues become irritated and inflamed and often become very moist. Mucus production is also common. A cold is contagious. You can easily spread the virus to others by oral contact. This includes kissing, sharing a glass, coughing, or sneezing. Touching your mouth or nose and then touching a surface, which is then touched by another person, can also spread the virus. SYMPTOMS  Symptoms typically develop 1 to 3 days after you come in contact with a cold virus. Symptoms vary from person to person. They may include:  Runny nose.  Sneezing.  Nasal congestion.  Sinus irritation.  Sore throat.  Loss of voice (laryngitis).  Cough.  Fatigue.  Muscle aches.  Loss of appetite.  Headache.  Low-grade fever. DIAGNOSIS  You might diagnose your own cold based on familiar symptoms, since most people get a cold 2 to 3 times a year. Your caregiver can  confirm this based on your exam. Most importantly, your caregiver can check that your symptoms are not due to another disease such as strep throat, sinusitis, pneumonia, asthma, or epiglottitis. Blood tests, throat tests, and X-rays are not necessary to diagnose a common cold, but they may sometimes be helpful in excluding other more serious diseases. Your caregiver will decide if any further tests are required. RISKS AND COMPLICATIONS  You may be at risk for a more severe case of the common cold if you smoke cigarettes, have chronic heart disease (such as heart failure) or lung disease (such as asthma), or if you have a weakened immune system. The very young and very old are also at risk for more serious infections. Bacterial sinusitis, middle ear infections, and bacterial pneumonia can complicate the common cold. The common cold can worsen asthma and chronic obstructive pulmonary disease (COPD). Sometimes, these complications can require emergency medical care and may be life-threatening. PREVENTION  The best way to protect against getting a cold is to practice good hygiene. Avoid oral or hand contact with people with cold symptoms. Wash your hands often if contact occurs. There is no clear evidence that vitamin C, vitamin E, echinacea, or exercise reduces the chance of developing a cold. However, it is always recommended to get plenty of rest and practice good nutrition. TREATMENT  Treatment is directed at relieving symptoms. There is no cure. Antibiotics are not effective, because the infection is caused by a virus, not by bacteria. Treatment  may include:  Increased fluid intake. Sports drinks offer valuable electrolytes, sugars, and fluids.  Breathing heated mist or steam (vaporizer or shower).  Eating chicken soup or other clear broths, and maintaining good nutrition.  Getting plenty of rest.  Using gargles or lozenges for comfort.  Controlling fevers with ibuprofen or acetaminophen as  directed by your caregiver.  Increasing usage of your inhaler if you have asthma. Zinc gel and zinc lozenges, taken in the first 24 hours of the common cold, can shorten the duration and lessen the severity of symptoms. Pain medicines may help with fever, muscle aches, and throat pain. A variety of non-prescription medicines are available to treat congestion and runny nose. Your caregiver can make recommendations and may suggest nasal or lung inhalers for other symptoms.  HOME CARE INSTRUCTIONS   Only take over-the-counter or prescription medicines for pain, discomfort, or fever as directed by your caregiver.  Use a warm mist humidifier or inhale steam from a shower to increase air moisture. This may keep secretions moist and make it easier to breathe.  Drink enough water and fluids to keep your urine clear or pale yellow.  Rest as needed.  Return to work when your temperature has returned to normal or as your caregiver advises. You may need to stay home longer to avoid infecting others. You can also use a face mask and careful hand washing to prevent spread of the virus. SEEK MEDICAL CARE IF:   After the first few days, you feel you are getting worse rather than better.  You need your caregiver's advice about medicines to control symptoms.  You develop chills, worsening shortness of breath, or brown or red sputum. These may be signs of pneumonia.  You develop yellow or brown nasal discharge or pain in the face, especially when you bend forward. These may be signs of sinusitis.  You develop a fever, swollen neck glands, pain with swallowing, or white areas in the back of your throat. These may be signs of strep throat. SEEK IMMEDIATE MEDICAL CARE IF:   You have a fever.  You develop severe or persistent headache, ear pain, sinus pain, or chest pain.  You develop wheezing, a prolonged cough, cough up blood, or have a change in your usual mucus (if you have chronic lung disease).  You  develop sore muscles or a stiff neck. Document Released: 12/18/2000 Document Revised: 09/16/2011 Document Reviewed: 09/29/2013 [facility] Patient Information 2015 [place], [place]. This information is not intended to replace advice given to you by your health care provider. Make sure you discuss any questions you have with your health care provider.  Benzonatate capsules What is this medicine? BENZONATATE (ben ZOE na tate) is used to treat cough. This medicine may be used for other purposes; ask your health care provider or pharmacist if you have questions. COMMON BRAND NAME(S): Tessalon Perles, Zonatuss What should I tell my health care provider before I take this medicine? They need to know if you have any of these conditions: -kidney or liver disease -an unusual or allergic reaction to benzonatate, anesthetics, other medicines, foods, dyes, or preservatives -pregnant or trying to get pregnant -breast-feeding How should I use this medicine? Take this medicine by mouth with a glass of water. Follow the directions on the prescription label. Avoid breaking, chewing, or sucking the capsule, as this can cause serious side effects. Take your medicine at regular intervals. Do not take your medicine more often than directed. Talk to your pediatrician regarding the use of  this medicine in children. While this drug may be prescribed for children as young as 76 years old for selected conditions, precautions do apply. Overdosage: If you think you have taken too much of this medicine contact a poison control center or emergency room at once. NOTE: This medicine is only for you. Do not share this medicine with others. What if I miss a dose? If you miss a dose, take it as soon as you can. If it is almost time for your next dose, take only that dose. Do not take double or extra doses. What may interact with this medicine? Do not take this medicine with any of the following medications: -MAOIs like Carbex,  Eldepryl, Marplan, Nardil, and Parnate This list may not describe all possible interactions. Give your health care provider a list of all the medicines, herbs, non-prescription drugs, or dietary supplements you use. Also tell them if you smoke, drink alcohol, or use illegal drugs. Some items may interact with your medicine. What should I watch for while using this medicine? Tell your doctor if your symptoms do not improve or if they get worse. If you have a high fever, skin rash, or headache, see your health care professional. You may get drowsy or dizzy. Do not drive, use machinery, or do anything that needs mental alertness until you know how this medicine affects you. Do not sit or stand up quickly, especially if you are an older patient. This reduces the risk of dizzy or fainting spells. What side effects may I notice from receiving this medicine? Side effects that you should report to your doctor or health care professional as soon as possible: -allergic reactions like skin rash, itching or hives, swelling of the face, lips, or tongue -breathing problems -chest pain -confusion or hallucinations -irregular heartbeat -numbness of mouth or throat -seizures Side effects that usually do not require medical attention (report to your doctor or health care professional if they continue or are bothersome): -burning feeling in the eyes -constipation -headache -nasal congestion -stomach upset This list may not describe all possible side effects. Call your doctor for medical advice about side effects. You may report side effects to FDA at 1-800-FDA-1088. Where should I keep my medicine? Keep out of the reach of children. Store at room temperature between 15 and 30 degrees C (59 and 86 degrees F). Keep tightly closed. Protect from light and moisture. Throw away any unused medicine after the expiration date. NOTE: This sheet is a summary. It may not cover all possible information. If you have  questions about this medicine, talk to your doctor, pharmacist, or health care provider.  2015, [employer] Standard. (2007-09-23 14:52:56)

## 2014-05-12 NOTE — Progress Notes (Signed)
Internal Medicine Clinic Attending  Case discussed with Dr. McLean soon after the resident saw the patient.  We reviewed the resident's history and exam and pertinent patient test results.  I agree with the assessment, diagnosis, and plan of care documented in the resident's note. 

## 2014-05-27 ENCOUNTER — Telehealth: Payer: Self-pay | Admitting: *Deleted

## 2014-05-27 DIAGNOSIS — B373 Candidiasis of vulva and vagina: Secondary | ICD-10-CM

## 2014-05-27 DIAGNOSIS — B3731 Acute candidiasis of vulva and vagina: Secondary | ICD-10-CM

## 2014-05-27 NOTE — Telephone Encounter (Signed)
Patient called requesting a prescription for BV and for yeast.  Attempted to contact the patient and left a message for her to contact the office.

## 2014-06-10 MED ORDER — FLUCONAZOLE 150 MG PO TABS: 150.0000 mg | ORAL_TABLET | Freq: Once | ORAL | Status: DC

## 2014-06-10 NOTE — Telephone Encounter (Signed)
Patient called the office requesting a prescription for Diflucan. Patient states she is having discharge with itching. Per Nursing Protocol prescription sent to the pharmacy.

## 2014-06-21 ENCOUNTER — Telehealth: Payer: Self-pay | Admitting: *Deleted

## 2014-06-21 NOTE — Telephone Encounter (Signed)
Patient called for appointment with Doctor JM.  12:00 LM on VM to CB

## 2014-07-04 ENCOUNTER — Encounter: Payer: Self-pay | Admitting: *Deleted

## 2014-07-05 ENCOUNTER — Encounter: Payer: Self-pay | Admitting: Obstetrics & Gynecology

## 2014-07-06 ENCOUNTER — Telehealth: Payer: Self-pay | Admitting: *Deleted

## 2014-07-06 ENCOUNTER — Other Ambulatory Visit: Payer: Self-pay | Admitting: *Deleted

## 2014-07-06 DIAGNOSIS — B379 Candidiasis, unspecified: Secondary | ICD-10-CM

## 2014-07-06 DIAGNOSIS — B9689 Other specified bacterial agents as the cause of diseases classified elsewhere: Secondary | ICD-10-CM

## 2014-07-06 DIAGNOSIS — N760 Acute vaginitis: Principal | ICD-10-CM

## 2014-07-06 MED ORDER — METRONIDAZOLE 0.75 % VA GEL: 1.0000 | Freq: Every day | VAGINAL | Status: DC

## 2014-07-06 MED ORDER — FLUCONAZOLE 150 MG PO TABS: 150.0000 mg | ORAL_TABLET | Freq: Once | ORAL | Status: DC

## 2014-07-06 NOTE — Progress Notes (Signed)
Pt called to office with symptoms of bacterial infection and yeast infection. Pt request treatment for both.   Pt made aware that treatment could be sent per protocol.   Metrogel and Diflucan were sent to pharmacy.  Pt has scheduled a follow up appt to discuss recurrent BV and yeast.

## 2014-07-06 NOTE — Telephone Encounter (Signed)
Patient called the office requesting a call back.  Attempted to contact the patient and left message for patient to call the office.

## 2014-07-12 ENCOUNTER — Ambulatory Visit: Payer: Medicaid Other | Admitting: Obstetrics

## 2014-07-14 NOTE — Telephone Encounter (Signed)
Patient was due to come in the office 07-12-14. Patient did not show for appointment. Patient has not contacted the office. Will re-file.

## 2014-07-18 NOTE — Telephone Encounter (Signed)
Patient given appointment 1/5 - no showed.

## 2014-09-13 ENCOUNTER — Ambulatory Visit: Payer: Medicaid Other | Admitting: Obstetrics

## 2014-10-12 ENCOUNTER — Telehealth: Payer: Self-pay | Admitting: *Deleted

## 2014-10-12 NOTE — Telephone Encounter (Signed)
Pt informed and will go to ED if needed.

## 2014-10-12 NOTE — Telephone Encounter (Signed)
Pt called clinic with c/o chest pain.  Onset a few weeks ago and also noted while at the gym.  She describes the pain as a quick pinch sensation on the right side of chest.  No pain or pressure, also denies SOB, nausea and diaphoreses.   She also wants to be seen for leg pain. She is treated by Dr. Jacelyn Grip ( ortho ) but is not happy with the injections she has received.  He is giving her Tramadol that does not help and burns her face.     Hx:PE  I offered an appointment today at 3:45 but she can not come in.  I put her on a waiting list until the scheduled appointment on 4/11 @ 10:45 Pt # [phone]

## 2014-10-12 NOTE — Telephone Encounter (Signed)
Agree that appointment can likely be next week.  She needs to be given information about when to present to ED, including pressure, presistence of pain and/or association of other symptoms.   Thanks.

## 2014-10-13 ENCOUNTER — Telehealth: Payer: Self-pay | Admitting: Internal Medicine

## 2014-10-13 NOTE — Telephone Encounter (Signed)
Call to patient to confirm appointment for 10/17/14 at 10:45 lmtcb ° °

## 2014-10-14 ENCOUNTER — Ambulatory Visit: Payer: Self-pay | Admitting: Internal Medicine

## 2014-10-17 ENCOUNTER — Encounter: Payer: Self-pay | Admitting: Internal Medicine

## 2014-10-17 ENCOUNTER — Ambulatory Visit: Payer: Self-pay | Admitting: Internal Medicine

## 2014-11-08 ENCOUNTER — Encounter (HOSPITAL_COMMUNITY): Payer: Self-pay

## 2014-11-08 ENCOUNTER — Emergency Department (HOSPITAL_COMMUNITY): Payer: Medicaid Other

## 2014-11-08 ENCOUNTER — Emergency Department (HOSPITAL_COMMUNITY)
Admission: EM | Admit: 2014-11-08 | Discharge: 2014-11-08 | Discharge status: Against Medical Advice | Payer: Medicaid Other | Attending: Emergency Medicine | Admitting: Emergency Medicine

## 2014-11-08 DIAGNOSIS — I10 Essential (primary) hypertension: Secondary | ICD-10-CM | POA: Insufficient documentation

## 2014-11-08 DIAGNOSIS — R079 Chest pain, unspecified: Secondary | ICD-10-CM | POA: Insufficient documentation

## 2014-11-08 DIAGNOSIS — J45909 Unspecified asthma, uncomplicated: Secondary | ICD-10-CM | POA: Diagnosis not present

## 2014-11-08 DIAGNOSIS — G8929 Other chronic pain: Secondary | ICD-10-CM | POA: Diagnosis not present

## 2014-11-08 LAB — CBC
HCT: 42.6 % (ref 36.0–46.0)
Hemoglobin: 14.6 g/dL (ref 12.0–15.0)
MCH: 31.9 pg (ref 26.0–34.0)
MCHC: 34.3 g/dL (ref 30.0–36.0)
MCV: 93 fL (ref 78.0–100.0)
PLATELETS: 284 10*3/uL (ref 150–400)
RBC: 4.58 MIL/uL (ref 3.87–5.11)
RDW: 13 % (ref 11.5–15.5)
WBC: 11 10*3/uL — ABNORMAL HIGH (ref 4.0–10.5)

## 2014-11-08 LAB — I-STAT TROPONIN, ED: Troponin i, poc: 0 ng/mL (ref 0.00–0.08)

## 2014-11-08 LAB — BASIC METABOLIC PANEL
ANION GAP: 9 (ref 5–15)
CHLORIDE: 107 mmol/L (ref 101–111)
CO2: 23 mmol/L (ref 22–32)
Calcium: 9 mg/dL (ref 8.9–10.3)
Creatinine, Ser: 0.75 mg/dL (ref 0.44–1.00)
GFR calc non Af Amer: >60 mL/min (ref >60)
Glucose, Bld: 100 mg/dL — ABNORMAL HIGH (ref 70–99)
POTASSIUM: 3.9 mmol/L (ref 3.5–5.1)
Sodium: 139 mmol/L (ref 135–145)

## 2014-11-08 NOTE — ED Notes (Signed)
The pt came to the desk and handed me her bp cuff stating that she was going home so she could lie down

## 2014-11-08 NOTE — ED Notes (Signed)
Pt. Was in the store had started having chest pain, lt. Side heaviness, She reports that it feels the same when she had PE , She denies any sob n/v, Skin is warm and dry, GCS 15, ECG being completed in Triage.

## 2014-11-09 ENCOUNTER — Ambulatory Visit (INDEPENDENT_AMBULATORY_CARE_PROVIDER_SITE_OTHER): Payer: Medicaid Other | Admitting: Obstetrics

## 2014-11-09 ENCOUNTER — Encounter: Payer: Self-pay | Admitting: Obstetrics

## 2014-11-09 VITALS — BP 104/68 | HR 77 | Temp 98.8°F | Ht 66.0 in | Wt 148.0 lb

## 2014-11-09 DIAGNOSIS — N760 Acute vaginitis: Secondary | ICD-10-CM | POA: Diagnosis not present

## 2014-11-09 DIAGNOSIS — Z0000 Encounter for general adult medical examination without abnormal findings: Secondary | ICD-10-CM | POA: Diagnosis not present

## 2014-11-09 DIAGNOSIS — B373 Candidiasis of vulva and vagina: Secondary | ICD-10-CM | POA: Diagnosis not present

## 2014-11-09 DIAGNOSIS — N898 Other specified noninflammatory disorders of vagina: Secondary | ICD-10-CM | POA: Diagnosis not present

## 2014-11-09 DIAGNOSIS — A499 Bacterial infection, unspecified: Secondary | ICD-10-CM | POA: Diagnosis not present

## 2014-11-09 DIAGNOSIS — B3731 Acute candidiasis of vulva and vagina: Secondary | ICD-10-CM

## 2014-11-09 DIAGNOSIS — B9689 Other specified bacterial agents as the cause of diseases classified elsewhere: Secondary | ICD-10-CM

## 2014-11-09 MED ORDER — METRONIDAZOLE 0.75 % VA GEL: 1.0000 | Freq: Two times a day (BID) | VAGINAL | Status: DC

## 2014-11-09 MED ORDER — CITRANATAL HARMONY 27-1-260 MG PO CAPS: 1.0000 | ORAL_CAPSULE | Freq: Every day | ORAL | Status: DC

## 2014-11-09 MED ORDER — FLUCONAZOLE 150 MG PO TABS: 150.0000 mg | ORAL_TABLET | Freq: Once | ORAL | Status: DC

## 2014-11-09 NOTE — Progress Notes (Signed)
Patient ID: Megan Chandler, female   DOB: December 22, 1975, 39 y.o.   MRN: [id number]  Chief Complaint  Patient presents with  . Vaginitis    Discharge with odor and slight itching.     HPI Megan Chandler is a 39 y.o. female.  Has vaginal discharge with odor and mild itching. HPI  Past Medical History  Diagnosis Date  . Urinary tract infection   . Chronic low back pain     buldging disc  . Endometriosis   . Bronchitis   . Headache(784.0)     often but no migraines  . Arthritis   . History of bladder infections   . Bartholin's cyst     hx of  . Pulmonary embolism     11/2011/ states no since  anticoagulation 1/14  . Menometrorrhagia   . Adenomyosis   . Hypertension     but not on medication  . Asthma     daily inhaler use  . Anxiety   . Shortness of breath     due to asthma  . GERD (gastroesophageal reflux disease)   . Anemia   . History of blood transfusion June 2013    Past Surgical History  Procedure Laterality Date  . Dilation and curettage of uterus    . Cholecystectomy, laparoscopic  1998  . Umbilical hernia repair      as a child  . Cesarean section  1996  . Laparoscopy  at age 26  . Cervical spine surgery  2009  . Back surgery      07/2011  . Lumbar spine surgery    . Laparoscopic tubal ligation  02/12/2012    Procedure: LAPAROSCOPIC TUBAL LIGATION;  Surgeon: Lahoma Crocker, MD;  Location: [facility];  Service: Gynecology;  Laterality: Bilateral;  . Tubal ligation    . Hysteroscopy  04/02/2012    Procedure: HYSTEROSCOPY;  Surgeon: Lahoma Crocker, MD;  Location: [facility];  Service: Gynecology;  Laterality: N/A;  with Dilitation and curretage and attempted hydrothermal ablation  . Robotic assisted lap vaginal hysterectomy N/A 10/09/2012    Procedure: ROBOTIC ASSISTED LAPAROSCOPIC VAGINAL HYSTERECTOMY;  Surgeon: Lahoma Crocker, MD;  Location: [facility];  Service: Gynecology;  Laterality: N/A;  . Bilateral salpingectomy Bilateral 10/09/2012    Procedure:  BILATERAL SALPINGECTOMY;  Surgeon: Lahoma Crocker, MD;  Location: [facility];  Service: Gynecology;  Laterality: Bilateral;  . Abdominal hysterectomy      Family History  Problem Relation Age of Onset  . Hypertension Mother   . Cancer Maternal Aunt   . Cancer Maternal Uncle   . Diabetes Maternal Grandmother   . Anesthesia problems Neg Hx   . Hypotension Neg Hx   . Malignant hyperthermia Neg Hx   . Pseudochol deficiency Neg Hx   . Stroke Other     aunt  . Sickle cell anemia Other     Social History History  Substance Use Topics  . Smoking status: Former Smoker -- 0.50 packs/day    Types: Cigarettes, Cigars    Start date: 07/09/1995    Quit date: 11/20/2011  . Smokeless tobacco: Never Used     Comment: pt quit cigarettes in 2009, pt smoke 2 black and milds a day until 09-2011  . Alcohol Use: 0.0 oz/week    0 Standard drinks or equivalent per week     Comment: Occasionally, "maybe 1-2 times/month"    Allergies  Allergen Reactions  . Morphine And Related Anaphylaxis    Pt can take dilaudid  .  Aspirin Itching    Pt can tolerate ibuprofen  . Contrast Media [Iodinated Diagnostic Agents] Nausea And Vomiting  . Penicillins Itching  . Tramadol Itching  . Vicodin [Hydrocodone-Acetaminophen] Itching    Pt can take percocet    Current Outpatient Prescriptions  Medication Sig Dispense Refill  . albuterol (PROVENTIL HFA;VENTOLIN HFA) 108 (90 BASE) MCG/ACT inhaler Inhale 2 puffs into the lungs every 4 (four) hours as needed for wheezing or shortness of breath (cough). 1 Inhaler 6  . albuterol (PROVENTIL) (2.5 MG/3ML) 0.083% nebulizer solution Take 3 mLs (2.5 mg total) by nebulization every 6 (six) hours as needed for wheezing or shortness of breath. 75 mL 12  . budesonide-formoterol (SYMBICORT) 80-4.5 MCG/ACT inhaler Inhale 2 puffs into the lungs 2 (two) times daily. 1 Inhaler 5  . traMADol (ULTRAM) 50 MG tablet Take 50 mg by mouth every 6 (six) hours as needed.    . fluconazole  (DIFLUCAN) 150 MG tablet Take 1 tablet (150 mg total) by mouth once. 1 tablet 2  . metroNIDAZOLE (METROGEL VAGINAL) 0.75 % vaginal gel Place 1 Applicatorful vaginally 2 (two) times daily. 70 g 2  . Prenat-FeFmCb-DSS-FA-DHA w/o A (CITRANATAL HARMONY) 27-1-260 MG CAPS Take 1 capsule by mouth daily before breakfast. 30 capsule 11   No current facility-administered medications for this visit.    Review of Systems Review of Systems Constitutional: negative for fatigue and weight loss Respiratory: negative for cough and wheezing Cardiovascular: negative for chest pain, fatigue and palpitations Gastrointestinal: negative for abdominal pain and change in bowel habits Genitourinary: positive for malodorous vaginal discharge Integument/breast: negative for nipple discharge Musculoskeletal:negative for myalgias Neurological: negative for gait problems and tremors  Behavioral/Psych: negative for abusive relationship, depression Endocrine: negative for temperature intolerance     Blood pressure 104/68, pulse 77, temperature 98.8 F (37.1 C), height 5\' 6"  (1.676 m), weight 148 lb (67.132 kg), last menstrual period 05/01/2012.  Physical Exam Physical Exam: Abdomen:  normal findings: no organomegaly, soft, non-tender and no hernia  Pelvis:  External genitalia: normal general appearance Urinary system: urethral meatus normal and bladder without fullness, nontender Vaginal: normal without tenderness, induration or masses.  Thin, grey d/c Cervix: absent Adnexa: normal bimanual exam Uterus: absent      Data Reviewed Labs  Assessment     Vaginitis, probable BV.     Plan    MetroGel Vaginal Rx. Diflucan Rx F/U prn.   Orders Placed This Encounter  Procedures  . SureSwab Bacterial Vaginosis/itis   Meds ordered this encounter  Medications  . traMADol (ULTRAM) 50 MG tablet    Sig: Take 50 mg by mouth every 6 (six) hours as needed.  . metroNIDAZOLE (METROGEL VAGINAL) 0.75 % vaginal gel     Sig: Place 1 Applicatorful vaginally 2 (two) times daily.    Dispense:  70 g    Refill:  2  . fluconazole (DIFLUCAN) 150 MG tablet    Sig: Take 1 tablet (150 mg total) by mouth once.    Dispense:  1 tablet    Refill:  2  . Prenat-FeFmCb-DSS-FA-DHA w/o A (CITRANATAL HARMONY) 27-1-260 MG CAPS    Sig: Take 1 capsule by mouth daily before breakfast.    Dispense:  30 capsule    Refill:  11

## 2014-11-09 NOTE — Patient Instructions (Signed)
Bacterial Vaginosis Bacterial vaginosis is a vaginal infection that occurs when the normal balance of bacteria in the vagina is disrupted. It results from an overgrowth of certain bacteria. This is the most common vaginal infection in women of childbearing age. Treatment is important to prevent complications, especially in pregnant women, as it can cause a premature delivery. CAUSES  Bacterial vaginosis is caused by an increase in harmful bacteria that are normally present in smaller amounts in the vagina. Several different kinds of bacteria can cause bacterial vaginosis. However, the reason that the condition develops is not fully understood. RISK FACTORS Certain activities or behaviors can put you at an increased risk of developing bacterial vaginosis, including:  Having a new sex partner or multiple sex partners.  Douching.  Using an intrauterine device (IUD) for contraception. Women do not get bacterial vaginosis from toilet seats, bedding, swimming pools, or contact with objects around them. SIGNS AND SYMPTOMS  Some women with bacterial vaginosis have no signs or symptoms. Common symptoms include:  Grey vaginal discharge.  A fishlike odor with discharge, especially after sexual intercourse.  Itching or burning of the vagina and vulva.  Burning or pain with urination. DIAGNOSIS  Your health care provider will take a medical history and examine the vagina for signs of bacterial vaginosis. A sample of vaginal fluid may be taken. Your health care provider will look at this sample under a microscope to check for bacteria and abnormal cells. A vaginal pH test may also be done.  TREATMENT  Bacterial vaginosis may be treated with antibiotic medicines. These may be given in the form of a pill or a vaginal cream. A second round of antibiotics may be prescribed if the condition comes back after treatment.  HOME CARE INSTRUCTIONS   Only take over-the-counter or prescription medicines as  directed by your health care provider.  If antibiotic medicine was prescribed, take it as directed. Make sure you finish it even if you start to feel better.  Do not have sex until treatment is completed.  Tell all sexual partners that you have a vaginal infection. They should see their health care provider and be treated if they have problems, such as a mild rash or itching.  Practice safe sex by using condoms and only having one sex partner. SEEK MEDICAL CARE IF:   Your symptoms are not improving after 3 days of treatment.  You have increased discharge or pain.  You have a fever. MAKE SURE YOU:   Understand these instructions.  Will watch your condition.  Will get help right away if you are not doing well or get worse. FOR MORE INFORMATION  Centers for Disease Control and Prevention, Division of STD Prevention: www.cdc.gov/std American Sexual Health Association (ASHA): www.ashastd.org  Document Released: 06/24/2005 Document Revised: 04/14/2013 Document Reviewed: 02/03/2013 [employer] Patient Information 2015 [employer]. This information is not intended to replace advice given to you by your health care provider. Make sure you discuss any questions you have with your health care provider.  

## 2014-11-10 ENCOUNTER — Encounter: Payer: Self-pay | Admitting: Obstetrics

## 2014-11-13 ENCOUNTER — Other Ambulatory Visit: Payer: Self-pay | Admitting: Obstetrics

## 2014-11-13 DIAGNOSIS — N760 Acute vaginitis: Principal | ICD-10-CM

## 2014-11-13 DIAGNOSIS — B9689 Other specified bacterial agents as the cause of diseases classified elsewhere: Secondary | ICD-10-CM

## 2014-11-13 LAB — SURESWAB BACTERIAL VAGINOSIS/ITIS
Atopobium vaginae: 7.4 Log (cells/mL)
C. GLABRATA, DNA: NOT DETECTED
C. PARAPSILOSIS, DNA: NOT DETECTED
C. albicans, DNA: DETECTED — AB
C. tropicalis, DNA: NOT DETECTED
Gardnerella vaginalis: >8 Log (cells/mL)
LACTOBACILLUS SPECIES: NOT DETECTED Log (cells/mL)
MEGASPHAERA SPECIES: 7.8 Log (cells/mL)
T. VAGINALIS RNA, QL TMA: NOT DETECTED

## 2014-11-30 ENCOUNTER — Encounter: Payer: Self-pay | Admitting: Internal Medicine

## 2014-11-30 ENCOUNTER — Ambulatory Visit (INDEPENDENT_AMBULATORY_CARE_PROVIDER_SITE_OTHER): Payer: Medicaid Other | Admitting: Internal Medicine

## 2014-11-30 VITALS — BP 118/73 | HR 86 | Temp 98.2°F | Ht 66.0 in | Wt 151.0 lb

## 2014-11-30 DIAGNOSIS — M5432 Sciatica, left side: Secondary | ICD-10-CM | POA: Diagnosis not present

## 2014-11-30 DIAGNOSIS — M2011 Hallux valgus (acquired), right foot: Secondary | ICD-10-CM | POA: Diagnosis not present

## 2014-11-30 DIAGNOSIS — M21611 Bunion of right foot: Secondary | ICD-10-CM | POA: Insufficient documentation

## 2014-11-30 NOTE — Progress Notes (Signed)
   Subjective:    Patient ID: Megan Chandler, female    DOB: 12/13/1975, 39 y.o.   MRN: [id number]  HPI  39 yo female with hx remote PE 2013 (treated with warfarin 6 months), asthma, GERD, is here with left leg pain and right foot pain.  Wants referral to ortho for her left leg pain and right foot pain.  Had lumbar fusion L3-L4 in the past. Has sciatic pain for about 1 year.   Has bunion on right foot for 6 months.  See problem based charting for details.  Review of Systems  Constitutional: Negative for fever and chills.  Eyes: Negative for visual disturbance.  Respiratory: Negative for chest tightness, shortness of breath and wheezing.   Cardiovascular: Negative for chest pain, palpitations and leg swelling.  Gastrointestinal: Negative for nausea, vomiting, diarrhea and abdominal distention.  Endocrine: Negative.   Genitourinary: Negative.   Musculoskeletal: Positive for back pain. Negative for gait problem.  Neurological: Negative for weakness, numbness and headaches.  Hematological: Negative.        Objective:   Physical Exam  Constitutional: She is oriented to person, place, and time. She appears well-developed and well-nourished. No distress.  HENT:  Head: Normocephalic and atraumatic.  Mouth/Throat: Oropharynx is clear and moist.  Eyes: Conjunctivae are normal. Pupils are equal, round, and reactive to light.  Neck: Normal range of motion.  Cardiovascular: Normal rate and regular rhythm.  Exam reveals no gallop and no friction rub.   No murmur heard. Pulmonary/Chest: Effort normal and breath sounds normal. No respiratory distress. She has no wheezes. She has no rales. She exhibits no tenderness.  Abdominal: Soft. Bowel sounds are normal. She exhibits no distension. There is no tenderness.  Musculoskeletal: Normal range of motion. She exhibits no edema or tenderness.       Feet:  Has 5/5 str on right leg and 4/5 str on left leg. ROM slightly limited on left leg due  to pain. Is able to walk without losing balance. Sensation intact on both sides but slightly diminished on the left leg compared to the right.  Has equal reflexes on both legs.   No tenderness on the back  Neurological: She is alert and oriented to person, place, and time. No cranial nerve deficit.  Skin: She is not diaphoretic.     Filed Vitals:   11/30/14 1457  BP: 118/73  Pulse: 86  Temp:         Assessment & Plan:  See problem based a&p.

## 2014-11-30 NOTE — Assessment & Plan Note (Signed)
Has Bunion on right great toe for 6 months. Has spoke to [facility] who recommended that she sees Dr. Rip Harbour in their practice. She is asking for a referral.  She has pain on this region. Has tried modifying her shoes without much improvement.  Referred to Dr. Rip Harbour (sports medicine physicain at [facility]).

## 2014-11-30 NOTE — Assessment & Plan Note (Signed)
Has chronic sciatic pain, had Lumbar fusion in the past but continued to have pain. This episode of pain has been going on for 1 year. She has seen Dr. [facility]an[facility] for this, who has been trying steroid injection to her back with mild relief of the pain. She is also taking tramadol which is prescribed through them.   No loss of bowel or bladder function. Is able to walk. Has sligthly decreased sensation on the left leg and decreased strength. Has significant pain.   Put referral back to ortho. She does not want surgery and she cannot get PT because medicaid will not pay for it. Will do conservative therapy per ortho unless they have any other recs.

## 2014-11-30 NOTE — Patient Instructions (Signed)
Please see the orthopaedist for your left sciatic pain and also sports medicine Dr. Rip Harbour at their practice for your right great toe bunion.   Follow up with [place] as needed.  Take tylenol for pain relief in addition to your tramadol.

## 2014-12-01 NOTE — Progress Notes (Signed)
Case discussed with Dr. Ahmed at time of visit. We reviewed the resident's history and exam and pertinent patient test results. I agree with the assessment, diagnosis, and plan of care documented in the resident's note. 

## 2014-12-02 ENCOUNTER — Other Ambulatory Visit: Payer: Self-pay | Admitting: Obstetrics

## 2014-12-02 ENCOUNTER — Other Ambulatory Visit (INDEPENDENT_AMBULATORY_CARE_PROVIDER_SITE_OTHER): Payer: Medicaid Other

## 2014-12-02 VITALS — BP 108/69 | HR 68 | Temp 97.3°F | Wt 148.0 lb

## 2014-12-02 DIAGNOSIS — R399 Unspecified symptoms and signs involving the genitourinary system: Secondary | ICD-10-CM | POA: Diagnosis not present

## 2014-12-02 LAB — POCT URINALYSIS DIPSTICK
BILIRUBIN UA: NEGATIVE
Glucose, UA: NEGATIVE
KETONES UA: NEGATIVE
Leukocytes, UA: NEGATIVE
PH UA: 6
RBC UA: 250
Spec Grav, UA: 1.015
UROBILINOGEN UA: NEGATIVE

## 2014-12-02 MED ORDER — FLUCONAZOLE 150 MG PO TABS: 150.0000 mg | ORAL_TABLET | Freq: Once | ORAL | Status: DC

## 2014-12-02 MED ORDER — NITROFURANTOIN MONOHYD MACRO 100 MG PO CAPS: 100.0000 mg | ORAL_CAPSULE | Freq: Two times a day (BID) | ORAL | Status: DC

## 2014-12-02 NOTE — Addendum Note (Signed)
Addended by: Carole Binning on: 12/02/2014 03:18 PM   Modules accepted: Orders

## 2014-12-02 NOTE — Addendum Note (Signed)
Addended by: Carole Binning on: 12/02/2014 09:55 AM   Modules accepted: Level of Service

## 2014-12-02 NOTE — Progress Notes (Signed)
Patient in the office today for UTI. Patient states seh is having pain when urinating. Also having pressure with urination. UA in office show positive nitrites. Culture has been sent. Patient encourage to increase fluids.

## 2014-12-05 ENCOUNTER — Other Ambulatory Visit: Payer: Self-pay | Admitting: Obstetrics

## 2014-12-05 LAB — URINE CULTURE

## 2015-01-02 ENCOUNTER — Encounter: Payer: Self-pay | Admitting: Internal Medicine

## 2015-01-02 DIAGNOSIS — M5417 Radiculopathy, lumbosacral region: Secondary | ICD-10-CM | POA: Insufficient documentation

## 2015-01-05 ENCOUNTER — Ambulatory Visit: Payer: Medicaid Other | Admitting: Obstetrics

## 2015-02-01 ENCOUNTER — Encounter: Payer: Self-pay | Admitting: Internal Medicine

## 2015-02-01 ENCOUNTER — Ambulatory Visit (INDEPENDENT_AMBULATORY_CARE_PROVIDER_SITE_OTHER): Payer: Medicaid Other | Admitting: Internal Medicine

## 2015-02-01 VITALS — BP 102/84 | HR 82 | Temp 98.3°F | Ht 66.0 in | Wt 151.7 lb

## 2015-02-01 DIAGNOSIS — L600 Ingrowing nail: Secondary | ICD-10-CM | POA: Diagnosis present

## 2015-02-01 NOTE — Progress Notes (Signed)
Subjective:    Patient ID: Megan Chandler, female    DOB: 1975/08/03, 39 y.o.   MRN: [id number]  HPI Comments: Megan Chandler is a 39 year old woman with PMH as below here for right toe pain.  Please see problem based charting for assessment and plan.     Past Medical History  Diagnosis Date  . Urinary tract infection   . Chronic low back pain     buldging disc  . Endometriosis   . Bronchitis   . Headache(784.0)     often but no migraines  . Arthritis   . History of bladder infections   . Bartholin's cyst     hx of  . Pulmonary embolism     11/2011/ states no since  anticoagulation 1/14  . Menometrorrhagia   . Adenomyosis   . Hypertension     but not on medication  . Asthma     daily inhaler use  . Anxiety   . Shortness of breath     due to asthma  . GERD (gastroesophageal reflux disease)   . Anemia   . History of blood transfusion June 2013   Current Outpatient Prescriptions on File Prior to Visit  Medication Sig Dispense Refill  . albuterol (PROVENTIL HFA;VENTOLIN HFA) 108 (90 BASE) MCG/ACT inhaler Inhale 2 puffs into the lungs every 4 (four) hours as needed for wheezing or shortness of breath (cough). 1 Inhaler 6  . albuterol (PROVENTIL) (2.5 MG/3ML) 0.083% nebulizer solution Take 3 mLs (2.5 mg total) by nebulization every 6 (six) hours as needed for wheezing or shortness of breath. 75 mL 12  . Prenat-FeFmCb-DSS-FA-DHA w/o A (CITRANATAL HARMONY) 27-1-260 MG CAPS Take 1 capsule by mouth daily before breakfast. 30 capsule 11  . budesonide-formoterol (SYMBICORT) 80-4.5 MCG/ACT inhaler Inhale 2 puffs into the lungs 2 (two) times daily. 1 Inhaler 5   No current facility-administered medications on file prior to visit.    Review of Systems  Constitutional: Negative for fever and chills.  Musculoskeletal: Negative for gait problem.  Skin:       Ingrown toenail. Pain started 5 days ago.  No preceding trauma or nail clipping.  No redness or discharge.       Filed  Vitals:   02/01/15 1521  BP: 102/84  Pulse: 82  Temp: 98.3 F (36.8 C)  TempSrc: Oral  Height: 5\' 6"  (1.676 m)  Weight: 151 lb 11.2 oz (68.811 kg)  SpO2: 100%    Objective:   Physical Exam  Constitutional: She is oriented to person, place, and time. She appears well-developed. No distress.  HENT:  Head: Normocephalic and atraumatic.  Mouth/Throat: Oropharynx is clear and moist. No oropharyngeal exudate.  Eyes: EOM are normal. Pupils are equal, round, and reactive to light.  Neck: Neck supple.  Cardiovascular: Normal rate, regular rhythm and normal heart sounds.  Exam reveals no gallop and no friction rub.   No murmur heard. Pulmonary/Chest: Effort normal and breath sounds normal. No respiratory distress. She has no wheezes. She has no rales.  Abdominal: Soft. Bowel sounds are normal. She exhibits no distension and no mass. There is no tenderness. There is no rebound and no guarding.  Musculoskeletal: Normal range of motion. She exhibits no edema or tenderness.  Neurological: She is alert and oriented to person, place, and time. No cranial nerve deficit.  Skin: Skin is warm. She is not diaphoretic. No erythema.  Ingrown toenail on right 1st toe.  There is no redness, swelling or discharge.  It is TTP.  Psychiatric: She has a normal mood and affect. Her behavior is normal.  Vitals reviewed.         Assessment & Plan:

## 2015-02-01 NOTE — Patient Instructions (Signed)
1. Please see the directions below to care for your ingrown toenail  I am referring you to podiatry for further care.   2. Please take all medications as prescribed.    3. If you have worsening of your symptoms or new symptoms arise, please call the clinic ([phone]), or go to the ER immediately if symptoms are severe.  Ingrown Toenail An ingrown toenail occurs when the sharp edge of your toenail grows into the skin. Causes of ingrown toenails include toenails clipped too far back or poorly fitting shoes. Activities involving sudden stops (basketball, tennis) causing "toe jamming" may lead to an ingrown nail. HOME CARE INSTRUCTIONS   Soak the whole foot in warm soapy water for 20 minutes, 3 times per day.  You may lift the edge of the nail away from the sore skin by wedging a small piece of cotton under the corner of the nail. Be careful not to dig (traumatize) and cause more injury to the area.  Wear shoes that fit well. While the ingrown nail is causing problems, sandals may be beneficial.  Trim your toenails regularly and carefully. Cut your toenails straight across, not in a curve. This will prevent injury to the skin at the corners of the toenail.  Keep your feet clean and dry.  Crutches may be helpful early in treatment if walking is painful.  Antibiotics, if prescribed, should be taken as directed.  Return for a wound check in 2 days or as directed.  Only take over-the-counter or prescription medicines for pain, discomfort, or fever as directed by your caregiver. SEEK IMMEDIATE MEDICAL CARE IF:   You have a fever.  You have increasing pain, redness, swelling, or heat at the wound site.  Your toe is not better in 7 days. If conservative treatment is not successful, surgical removal of a portion or all of the nail may be necessary. MAKE SURE YOU:   Understand these instructions.  Will watch your condition.  Will get help right away if you are not doing well or get  worse. Document Released: 06/21/2000 Document Revised: 09/16/2011 Document Reviewed: 06/15/2008 [facility] Patient Information 2015 [place], [place]. This information is not intended to replace advice given to you by your health care provider. Make sure you discuss any questions you have with your health care provider.

## 2015-02-02 DIAGNOSIS — L600 Ingrowing nail: Secondary | ICD-10-CM | POA: Insufficient documentation

## 2015-02-02 NOTE — Assessment & Plan Note (Signed)
Toe pain x 5 days. No preceding trauma.  Nail of first toe ingrown.  No sign of infection. - given instructions on soaking feet - do not cut nails for now - to wear loose fitting shoes - referral to podiatry for further management.

## 2015-02-03 NOTE — Progress Notes (Signed)
Medicine attending: Medical history, presenting problems, physical findings, and medications, reviewed with Dr Alex Wilson on the day of the patient visit   and I concur with her evaluation and management plan. 

## 2015-02-10 ENCOUNTER — Ambulatory Visit: Payer: Medicaid Other | Admitting: Podiatry

## 2015-02-13 ENCOUNTER — Ambulatory Visit: Payer: Medicaid Other | Admitting: Podiatry

## 2015-02-14 NOTE — Addendum Note (Signed)
Addended by: Hulan Fray on: 02/14/2015 10:33 AM   Modules accepted: Orders

## 2015-04-24 ENCOUNTER — Telehealth: Payer: Self-pay | Admitting: Licensed Clinical Social Worker

## 2015-04-24 ENCOUNTER — Encounter: Payer: Self-pay | Admitting: Licensed Clinical Social Worker

## 2015-04-24 NOTE — Telephone Encounter (Signed)
Megan Chandler placed urgent call to [facility] requesting a letter to provide [employer] by the close of business today.  Pt states she is in jeopardy of having her electric turned off.  Megan Chandler states [facility] in need of documentation that she has asthma.  CSW informed Megan Chandler, physicians request at least 2 weeks notice for written communication.  However, CSW will utilize chart information and provide letter with CSW signature.  Pt agreeable, pt's chart does indicate a dx of asthma.  CSW completed letter and will leave at front office for pt to pick up.  CSW will request pt to complete a ROI in case [employer] requests additional information.

## 2015-05-04 ENCOUNTER — Encounter: Payer: Self-pay | Admitting: Obstetrics

## 2015-05-04 ENCOUNTER — Ambulatory Visit (INDEPENDENT_AMBULATORY_CARE_PROVIDER_SITE_OTHER): Payer: Medicaid Other | Admitting: Obstetrics

## 2015-05-04 VITALS — BP 107/62 | HR 83 | Temp 98.3°F | Wt 158.0 lb

## 2015-05-04 DIAGNOSIS — N898 Other specified noninflammatory disorders of vagina: Secondary | ICD-10-CM

## 2015-05-04 DIAGNOSIS — B9689 Other specified bacterial agents as the cause of diseases classified elsewhere: Secondary | ICD-10-CM

## 2015-05-04 DIAGNOSIS — A499 Bacterial infection, unspecified: Secondary | ICD-10-CM | POA: Diagnosis not present

## 2015-05-04 DIAGNOSIS — R112 Nausea with vomiting, unspecified: Secondary | ICD-10-CM | POA: Diagnosis not present

## 2015-05-04 DIAGNOSIS — N760 Acute vaginitis: Secondary | ICD-10-CM

## 2015-05-04 MED ORDER — METRONIDAZOLE 500 MG PO TABS: 500.0000 mg | ORAL_TABLET | Freq: Two times a day (BID) | ORAL | Status: DC

## 2015-05-04 NOTE — Progress Notes (Signed)
Patient ID: Megan Chandler, female   DOB: 08-23-1975, 39 y.o.   MRN: [id number]  Chief Complaint  Patient presents with  . Vaginitis    HPI Megan Chandler is a 39 y.o. female.  Chronic, recurring BV.  HPI  Past Medical History  Diagnosis Date  . Urinary tract infection   . Chronic low back pain     buldging disc  . Endometriosis   . Bronchitis   . Headache(784.0)     often but no migraines  . Arthritis   . History of bladder infections   . Bartholin's cyst     hx of  . Pulmonary embolism ([place])     11/2011/ states no since  anticoagulation 1/14  . Menometrorrhagia   . Adenomyosis   . Hypertension     but not on medication  . Asthma     daily inhaler use  . Anxiety   . Shortness of breath     due to asthma  . GERD (gastroesophageal reflux disease)   . Anemia   . History of blood transfusion June 2013    Past Surgical History  Procedure Laterality Date  . Dilation and curettage of uterus    . Cholecystectomy, laparoscopic  1998  . Umbilical hernia repair      as a child  . Cesarean section  1996  . Laparoscopy  at age 61  . Cervical spine surgery  2009  . Back surgery      07/2011  . Lumbar spine surgery    . Laparoscopic tubal ligation  02/12/2012    Procedure: LAPAROSCOPIC TUBAL LIGATION;  Surgeon: Lahoma Crocker, MD;  Location: [facility];  Service: Gynecology;  Laterality: Bilateral;  . Tubal ligation    . Hysteroscopy  04/02/2012    Procedure: HYSTEROSCOPY;  Surgeon: Lahoma Crocker, MD;  Location: [facility];  Service: Gynecology;  Laterality: N/A;  with Dilitation and curretage and attempted hydrothermal ablation  . Robotic assisted lap vaginal hysterectomy N/A 10/09/2012    Procedure: ROBOTIC ASSISTED LAPAROSCOPIC VAGINAL HYSTERECTOMY;  Surgeon: Lahoma Crocker, MD;  Location: [facility];  Service: Gynecology;  Laterality: N/A;  . Bilateral salpingectomy Bilateral 10/09/2012    Procedure: BILATERAL SALPINGECTOMY;  Surgeon: Lahoma Crocker, MD;  Location:  [facility];  Service: Gynecology;  Laterality: Bilateral;  . Abdominal hysterectomy      Family History  Problem Relation Age of Onset  . Hypertension Mother   . Cancer Maternal Aunt   . Cancer Maternal Uncle   . Diabetes Maternal Grandmother   . Anesthesia problems Neg Hx   . Hypotension Neg Hx   . Malignant hyperthermia Neg Hx   . Pseudochol deficiency Neg Hx   . Stroke Other     aunt  . Sickle cell anemia Other     Social History Social History  Substance Use Topics  . Smoking status: Former Smoker -- 0.50 packs/day    Types: Cigarettes, Cigars    Start date: 07/09/1995    Quit date: 11/20/2011  . Smokeless tobacco: Never Used     Comment: pt quit cigarettes in 2009, pt smoke 2 black and milds a day until 09-2011  . Alcohol Use: 0.0 oz/week    0 Standard drinks or equivalent per week     Comment: Occasionally, "maybe 1-2 times/month"    Allergies  Allergen Reactions  . Morphine And Related Anaphylaxis    Pt can take dilaudid  . Aspirin Itching    Pt can tolerate ibuprofen  . Contrast  Media [Iodinated Diagnostic Agents] Nausea And Vomiting  . Penicillins Itching  . Tramadol Itching  . Vicodin [Hydrocodone-Acetaminophen] Itching    Pt can take percocet    Current Outpatient Prescriptions  Medication Sig Dispense Refill  . albuterol (PROVENTIL HFA;VENTOLIN HFA) 108 (90 BASE) MCG/ACT inhaler Inhale 2 puffs into the lungs every 4 (four) hours as needed for wheezing or shortness of breath (cough). 1 Inhaler 6  . albuterol (PROVENTIL) (2.5 MG/3ML) 0.083% nebulizer solution Take 3 mLs (2.5 mg total) by nebulization every 6 (six) hours as needed for wheezing or shortness of breath. 75 mL 12  . Prenat-FeFmCb-DSS-FA-DHA w/o A (CITRANATAL HARMONY) 27-1-260 MG CAPS Take 1 capsule by mouth daily before breakfast. 30 capsule 11  . budesonide-formoterol (SYMBICORT) 80-4.5 MCG/ACT inhaler Inhale 2 puffs into the lungs 2 (two) times daily. 1 Inhaler 5  . metroNIDAZOLE (FLAGYL) 500 MG  tablet Take 1 tablet (500 mg total) by mouth 2 (two) times daily. 20 tablet 0   No current facility-administered medications for this visit.    Review of Systems Review of Systems Constitutional: negative for fatigue and weight loss Respiratory: negative for cough and wheezing Cardiovascular: negative for chest pain, fatigue and palpitations Gastrointestinal: negative for abdominal pain and change in bowel habits Genitourinary: positive for malodorous discharge Integument/breast: negative for nipple discharge Musculoskeletal:negative for myalgias Neurological: negative for gait problems and tremors Behavioral/Psych: negative for abusive relationship, depression Endocrine: negative for temperature intolerance     Blood pressure 107/62, pulse 83, temperature 98.3 F (36.8 C), weight 158 lb (71.668 kg), last menstrual period 05/01/2012.  Physical Exam Physical Exam           General:  Alert and no distress Abdomen:  normal findings: no organomegaly, soft, non-tender and no hernia  Pelvis:  External genitalia: normal general appearance Urinary system: urethral meatus normal and bladder without fullness, nontender Vaginal: normal without tenderness, induration or masses.  Thin grey discharge Cervix: absent Adnexa: normal bimanual exam Uterus: absent      Data Reviewed Previous wet preps  Assessment     BV, recurrent     Plan    Treatment started for recurrent BV ( > 3 infections / year ):      Flagyl 500mg  po bid x 7 days      Boric Acid capsules started at same time as Flagyl 600mg  intravaginally ghs x 21 days      F/U in office after completing Boric Acid and if wet prep is clear then start MetroGel bid twice a week for 6 months  Orders Placed This Encounter  Procedures  . SureSwab Bacterial Vaginosis/itis  . Ambulatory referral to Gastroenterology    Referral Priority:  Routine    Referral Type:  Consultation    Referral Reason:  Specialty Services Required     Number of Visits Requested:  1   Meds ordered this encounter  Medications  . metroNIDAZOLE (FLAGYL) 500 MG tablet    Sig: Take 1 tablet (500 mg total) by mouth 2 (two) times daily.    Dispense:  20 tablet    Refill:  0

## 2015-05-05 ENCOUNTER — Encounter: Payer: Self-pay | Admitting: Physician Assistant

## 2015-05-11 LAB — SURESWAB BACTERIAL VAGINOSIS/ITIS
Atopobium vaginae: 7.3 Log (cells/mL)
C. ALBICANS, DNA: NOT DETECTED
C. glabrata, DNA: NOT DETECTED
C. parapsilosis, DNA: NOT DETECTED
C. tropicalis, DNA: NOT DETECTED
Gardnerella vaginalis: >8 Log (cells/mL)
LACTOBACILLUS SPECIES: NOT DETECTED Log (cells/mL)
MEGASPHAERA SPECIES: 7.9 Log (cells/mL)
T. VAGINALIS RNA, QL TMA: NOT DETECTED

## 2015-05-12 ENCOUNTER — Other Ambulatory Visit: Payer: Self-pay | Admitting: Obstetrics

## 2015-06-05 ENCOUNTER — Ambulatory Visit: Payer: Medicaid Other | Admitting: Obstetrics

## 2015-06-05 ENCOUNTER — Ambulatory Visit: Payer: Self-pay | Admitting: Physician Assistant

## 2015-06-06 ENCOUNTER — Encounter: Payer: Self-pay | Admitting: Internal Medicine

## 2015-06-09 ENCOUNTER — Encounter: Payer: Self-pay | Admitting: Internal Medicine

## 2015-06-09 ENCOUNTER — Ambulatory Visit (INDEPENDENT_AMBULATORY_CARE_PROVIDER_SITE_OTHER): Payer: Medicaid Other | Admitting: Internal Medicine

## 2015-06-09 VITALS — BP 103/76 | HR 104 | Temp 98.7°F | Ht 66.0 in | Wt 157.1 lb

## 2015-06-09 DIAGNOSIS — J069 Acute upper respiratory infection, unspecified: Secondary | ICD-10-CM | POA: Insufficient documentation

## 2015-06-09 MED ORDER — BENZONATATE 100 MG PO CAPS: 100.0000 mg | ORAL_CAPSULE | Freq: Three times a day (TID) | ORAL | Status: DC | PRN

## 2015-06-09 MED ORDER — DIPHENHYDRAMINE HCL 25 MG PO CAPS
25.0000 mg | ORAL_CAPSULE | ORAL | Status: DC | PRN
Start: 2015-06-09 — End: 2016-07-29

## 2015-06-09 NOTE — Patient Instructions (Addendum)
Megan Chandler,  It was a pleasure meeting you today. I'm sorry you feel so terrible but fortunately I think this is a common cold and I expect you will be feeling better in the next 3-5 days. Stay hydrated by drinking apple juice and as much water as you can. Try to avoid sodas as much as possible because these have a lot of sugar in them and can actually dehydrate you. I prescribed you Benadryl to take at night; this will help you sleep and dry up your secretions. I have also prescribed you Tessalon Perles which will help with your cough. Remember to wash your hands to prevent spreading this to other people. I hope you and your family start feeling better very soon, and I fully expect you well.  Take care, Dr. Melburn Hake

## 2015-06-09 NOTE — Assessment & Plan Note (Signed)
She presents today with a one-week history of rhinorrhea, cough, malaise, which I believe to be a viral URI. Her asthma is still well controlled, and when she does wheeze, she uses her albuterol inhaler and feels better. I don't think she has pneumonia as she denies any high chills and her pulmonary exam is normal. I reassured her this is a viral URI, and recommended she take Benadryl at night to help her sleep and help her postnasal drip. She was requesting something to help her cough, was going to prescribe her dextromethorphan but I saw extensive allergies to opiates, so I prescribed her Tessalon.

## 2015-06-09 NOTE — Progress Notes (Signed)
Subjective:   Patient ID: Megan Chandler female   DOB: 02/05/1976 39 y.o.   MRN: [id number]  HPI: [name] is a 39 y.o. -year-old lady with a history of hypertension, asthma, gastroesophageal reflux disease, pulmonary embolus and 2013 that was treated with warfarin for 3 months, and anxiety who presents with rhinorrhea, cough, malaise, and lethargy for the past week. She says everyone in her house is sick with a cold. She has been using her Symbicort twice a day as well as her albuterol nebulizer. She denies any spiking fevers, chills, chest pain, or wheezing. She wants something to help her cough and help her sleep.  Please see the assessment and plan for the status of the patient's chronic medical problems.   Past Medical History  Diagnosis Date  . Urinary tract infection   . Chronic low back pain     buldging disc  . Endometriosis   . Bronchitis   . Headache(784.0)     often but no migraines  . Arthritis   . History of bladder infections   . Bartholin's cyst     hx of  . Pulmonary embolism ([place])     11/2011/ states no since  anticoagulation 1/14  . Menometrorrhagia   . Adenomyosis   . Hypertension     but not on medication  . Asthma     daily inhaler use  . Anxiety   . Shortness of breath     due to asthma  . GERD (gastroesophageal reflux disease)   . Anemia   . History of blood transfusion June 2013   Current Outpatient Prescriptions  Medication Sig Dispense Refill  . albuterol (PROVENTIL HFA;VENTOLIN HFA) 108 (90 BASE) MCG/ACT inhaler Inhale 2 puffs into the lungs every 4 (four) hours as needed for wheezing or shortness of breath (cough). 1 Inhaler 6  . albuterol (PROVENTIL) (2.5 MG/3ML) 0.083% nebulizer solution Take 3 mLs (2.5 mg total) by nebulization every 6 (six) hours as needed for wheezing or shortness of breath. 75 mL 12  . budesonide-formoterol (SYMBICORT) 80-4.5 MCG/ACT inhaler Inhale 2 puffs into the lungs 2 (two) times daily. 1 Inhaler 5  .  metroNIDAZOLE (FLAGYL) 500 MG tablet Take 1 tablet (500 mg total) by mouth 2 (two) times daily. 20 tablet 0  . Prenat-FeFmCb-DSS-FA-DHA w/o A (CITRANATAL HARMONY) 27-1-260 MG CAPS Take 1 capsule by mouth daily before breakfast. 30 capsule 11   No current facility-administered medications for this visit.   Family History  Problem Relation Age of Onset  . Hypertension Mother   . Cancer Maternal Aunt   . Cancer Maternal Uncle   . Diabetes Maternal Grandmother   . Anesthesia problems Neg Hx   . Hypotension Neg Hx   . Malignant hyperthermia Neg Hx   . Pseudochol deficiency Neg Hx   . Stroke Other     aunt  . Sickle cell anemia Other    Social History   Social History  . Marital Status: Single    Spouse Name: N/A  . Number of Children: N/A  . Years of Education: N/A   Occupational History  . disabled    Social History Main Topics  . Smoking status: Former Smoker -- 0.50 packs/day    Types: Cigarettes, Cigars    Start date: 07/09/1995    Quit date: 11/20/2011  . Smokeless tobacco: Never Used     Comment: pt quit cigarettes in 2009, pt smoke 2 black and milds a day until 09-2011  . Alcohol  Use: 0.0 oz/week    0 Standard drinks or equivalent per week     Comment: Occasionally, "maybe 1-2 times/month"  . Drug Use: 2.00 per week    Special: Marijuana  . Sexual Activity: Yes    Birth Control/ Protection: None   Other Topics Concern  . Not on file   Social History Narrative   Review of Systems  Constitutional: Positive for malaise/fatigue. Negative for fever, chills, weight loss and diaphoresis.  HENT: Negative for hearing loss.   Eyes: Negative for blurred vision and double vision.  Respiratory: Positive for cough and shortness of breath. Negative for hemoptysis, sputum production and wheezing.   Cardiovascular: Negative for chest pain and palpitations.  Gastrointestinal: Negative for nausea, vomiting, abdominal pain and diarrhea.  Musculoskeletal: Positive for myalgias  and back pain.  Skin: Negative for itching and rash.  Neurological: Negative for dizziness, weakness and headaches.     Objective:  Physical Exam: Filed Vitals:   06/09/15 1448  BP: 103/76  Pulse: 104  Temp: 98.7 F (37.1 C)  TempSrc: Oral  Height: 5\' 6"  (1.676 m)  Weight: 157 lb 1.6 oz (71.26 kg)  SpO2: 100%   General: resting in chair comfortably, appropriately conversational HEENT: no scleral icterus, extra-ocular muscles intact, oropharynx without lesions Cardiac: regular rate and rhythm, no rubs, murmurs or gallops Pulm: breathing well, clear to auscultation bilaterally, without wheezes or focal ronchi Abd: bowel sounds normal, soft, nondistended, non-tender Ext: warm and well perfused, without pedal edema Lymph: no cervical or supraclavicular lymphadenopathy Skin: no rash, hair, or nail changes Neuro: alert and oriented X3, cranial nerves II-XII grossly intact, moving all extremities well  Assessment & Plan:   Please see problem-based charting for my assessment and plan.

## 2015-06-13 ENCOUNTER — Telehealth: Payer: Self-pay | Admitting: Internal Medicine

## 2015-06-13 NOTE — Telephone Encounter (Signed)
Pt would like a stronger cough syrup to be call into the pharmacy.

## 2015-06-13 NOTE — Progress Notes (Signed)
Internal Medicine Clinic Attending  I saw and evaluated the patient.  I personally confirmed the key portions of the history and exam documented by Dr. Flores and I reviewed pertinent patient test results.  The assessment, diagnosis, and plan were formulated together and I agree with the documentation in the resident's note.  

## 2015-06-13 NOTE — Telephone Encounter (Signed)
Dr. Melburn Hake..please advise.  Pt wanting something additional for cough

## 2015-09-06 ENCOUNTER — Encounter (HOSPITAL_COMMUNITY): Payer: Self-pay | Admitting: Family Medicine

## 2015-09-06 ENCOUNTER — Emergency Department (HOSPITAL_COMMUNITY)
Admission: EM | Admit: 2015-09-06 | Discharge: 2015-09-06 | Disposition: A | Discharge status: Home / Self Care | Payer: Medicaid Other | Attending: Physician Assistant | Admitting: Physician Assistant

## 2015-09-06 DIAGNOSIS — Z880 Allergy status to penicillin: Secondary | ICD-10-CM | POA: Insufficient documentation

## 2015-09-06 DIAGNOSIS — I10 Essential (primary) hypertension: Secondary | ICD-10-CM | POA: Diagnosis not present

## 2015-09-06 DIAGNOSIS — Z8742 Personal history of other diseases of the female genital tract: Secondary | ICD-10-CM | POA: Insufficient documentation

## 2015-09-06 DIAGNOSIS — M1990 Unspecified osteoarthritis, unspecified site: Secondary | ICD-10-CM | POA: Diagnosis not present

## 2015-09-06 DIAGNOSIS — D649 Anemia, unspecified: Secondary | ICD-10-CM | POA: Insufficient documentation

## 2015-09-06 DIAGNOSIS — Z20820 Contact with and (suspected) exposure to varicella: Secondary | ICD-10-CM | POA: Diagnosis present

## 2015-09-06 DIAGNOSIS — Z8659 Personal history of other mental and behavioral disorders: Secondary | ICD-10-CM | POA: Insufficient documentation

## 2015-09-06 DIAGNOSIS — Z7951 Long term (current) use of inhaled steroids: Secondary | ICD-10-CM | POA: Diagnosis not present

## 2015-09-06 DIAGNOSIS — Z86711 Personal history of pulmonary embolism: Secondary | ICD-10-CM | POA: Diagnosis not present

## 2015-09-06 DIAGNOSIS — J45909 Unspecified asthma, uncomplicated: Secondary | ICD-10-CM | POA: Diagnosis not present

## 2015-09-06 DIAGNOSIS — Z87440 Personal history of urinary (tract) infections: Secondary | ICD-10-CM | POA: Diagnosis not present

## 2015-09-06 DIAGNOSIS — Z87891 Personal history of nicotine dependence: Secondary | ICD-10-CM | POA: Diagnosis not present

## 2015-09-06 DIAGNOSIS — Z792 Long term (current) use of antibiotics: Secondary | ICD-10-CM | POA: Insufficient documentation

## 2015-09-06 DIAGNOSIS — Z79899 Other long term (current) drug therapy: Secondary | ICD-10-CM | POA: Diagnosis not present

## 2015-09-06 DIAGNOSIS — G8929 Other chronic pain: Secondary | ICD-10-CM | POA: Insufficient documentation

## 2015-09-06 DIAGNOSIS — Z8719 Personal history of other diseases of the digestive system: Secondary | ICD-10-CM | POA: Insufficient documentation

## 2015-09-06 NOTE — ED Notes (Signed)
Declined W/C at D/C and was escorted to lobby by RN. 

## 2015-09-06 NOTE — ED Provider Notes (Signed)
CSN: [id number]     Arrival date & time 09/06/15  [id number] History  By signing my name below, I, Emmanuella Mensah, attest that this documentation has been prepared under the direction and in the presence of  Gloriann Loan, PA-C. Electronically Signed: Judithann Sauger, ED Scribe. 09/06/2015. 10:03 AM.     Chief Complaint  Patient presents with  . came in contact with shingles    The history is provided by the patient. No language interpreter was used.   HPI Comments: Megan Chandler is a 40 y.o. female with a hx of HTN and anemia who presents to the Emergency Department requesting an evaluation for possible shingles. She explains that she hugged someone with shingles on their left upper chest yesterday. She reports a hx of chicken pox and has had the varicella vaccine. Pt does not present with any symptoms, denying any open wounds, rashes, or abrasions on her skin during contact with the other person. She denies any other medical complaints at this time.     Past Medical History  Diagnosis Date  . Urinary tract infection   . Chronic low back pain     buldging disc  . Endometriosis   . Bronchitis   . Headache(784.0)     often but no migraines  . Arthritis   . History of bladder infections   . Bartholin's cyst     hx of  . Pulmonary embolism ([place])     11/2011/ states no since  anticoagulation 1/14  . Menometrorrhagia   . Adenomyosis   . Hypertension     but not on medication  . Asthma     daily inhaler use  . Anxiety   . Shortness of breath     due to asthma  . GERD (gastroesophageal reflux disease)   . Anemia   . History of blood transfusion June 2013   Past Surgical History  Procedure Laterality Date  . Dilation and curettage of uterus    . Cholecystectomy, laparoscopic  1998  . Umbilical hernia repair      as a child  . Cesarean section  1996  . Laparoscopy  at age 7  . Cervical spine surgery  2009  . Back surgery      07/2011  . Lumbar spine surgery    . Laparoscopic  tubal ligation  02/12/2012    Procedure: LAPAROSCOPIC TUBAL LIGATION;  Surgeon: Lahoma Crocker, MD;  Location: [facility];  Service: Gynecology;  Laterality: Bilateral;  . Tubal ligation    . Hysteroscopy  04/02/2012    Procedure: HYSTEROSCOPY;  Surgeon: Lahoma Crocker, MD;  Location: [facility];  Service: Gynecology;  Laterality: N/A;  with Dilitation and curretage and attempted hydrothermal ablation  . Robotic assisted lap vaginal hysterectomy N/A 10/09/2012    Procedure: ROBOTIC ASSISTED LAPAROSCOPIC VAGINAL HYSTERECTOMY;  Surgeon: Lahoma Crocker, MD;  Location: [facility];  Service: Gynecology;  Laterality: N/A;  . Bilateral salpingectomy Bilateral 10/09/2012    Procedure: BILATERAL SALPINGECTOMY;  Surgeon: Lahoma Crocker, MD;  Location: [facility];  Service: Gynecology;  Laterality: Bilateral;  . Abdominal hysterectomy     Family History  Problem Relation Age of Onset  . Hypertension Mother   . Cancer Maternal Aunt   . Cancer Maternal Uncle   . Diabetes Maternal Grandmother   . Anesthesia problems Neg Hx   . Hypotension Neg Hx   . Malignant hyperthermia Neg Hx   . Pseudochol deficiency Neg Hx   . Stroke Other     aunt  .  Sickle cell anemia Other    Social History  Substance Use Topics  . Smoking status: Former Smoker -- 0.50 packs/day    Types: Cigarettes, Cigars    Start date: 07/09/1995    Quit date: 11/20/2011  . Smokeless tobacco: Never Used     Comment: pt quit cigarettes in 2009, pt smoke 2 black and milds a day until 09-2011  . Alcohol Use: 0.0 oz/week    0 Standard drinks or equivalent per week     Comment: Occasionally, "maybe 1-2 times/month"   OB History    Gravida Para Term Preterm AB TAB SAB Ectopic Multiple Living   2 1 1  0 1 0 1 0 0 1     Review of Systems  Constitutional: Negative for fever and chills.  Skin: Negative for color change, rash and wound.       Possible shingles   All other systems reviewed and are negative.     Allergies  Morphine and  related; Aspirin; Contrast media; Penicillins; Tramadol; and Vicodin  Home Medications   Prior to Admission medications   Medication Sig Start Date End Date Taking? Authorizing Provider  albuterol (PROVENTIL HFA;VENTOLIN HFA) 108 (90 BASE) MCG/ACT inhaler Inhale 2 puffs into the lungs every 4 (four) hours as needed for wheezing or shortness of breath (cough). 12/02/13   Neema Bobbie Stack, MD  albuterol (PROVENTIL) (2.5 MG/3ML) 0.083% nebulizer solution Take 3 mLs (2.5 mg total) by nebulization every 6 (six) hours as needed for wheezing or shortness of breath. 05/09/14   Nino Glow McLean-Scocozza, MD  benzonatate (TESSALON) 100 MG capsule Take 1 capsule (100 mg total) by mouth 3 (three) times daily as needed for cough. 06/09/15   Loleta Chance, MD  budesonide-formoterol [facility]) 80-4.5 MCG/ACT inhaler Inhale 2 puffs into the lungs 2 (two) times daily. 09/30/13 11/09/14  Brand Males, MD  diphenhydrAMINE (BENADRYL) 25 mg capsule Take 1 capsule (25 mg total) by mouth every 4 (four) hours as needed. 06/09/15   Loleta Chance, MD  metroNIDAZOLE (FLAGYL) 500 MG tablet Take 1 tablet (500 mg total) by mouth 2 (two) times daily. 05/04/15   Shelly Bombard, MD  Prenat-FeFmCb-DSS-FA-DHA w/o A (CITRANATAL HARMONY) 27-1-260 MG CAPS Take 1 capsule by mouth daily before breakfast. 11/09/14   Shelly Bombard, MD   BP 119/72 mmHg  Pulse 88  Temp(Src) 98.7 F (37.1 C) (Oral)  Resp 18  SpO2 99%  LMP 05/01/2012 Physical Exam  Constitutional: She is oriented to person, place, and time. She appears well-developed and well-nourished.  HENT:  Head: Normocephalic and atraumatic.  Right Ear: External ear normal.  Left Ear: External ear normal.  Eyes: Conjunctivae are normal. No scleral icterus.  Neck: No tracheal deviation present.  Pulmonary/Chest: Effort normal. No respiratory distress.  Abdominal: She exhibits no distension.  Musculoskeletal: Normal range of motion.  Neurological: She is alert and oriented to person,  place, and time.  Skin: Skin is warm and dry. No rash noted.  No evidence of vesicular rash in dermatomal pattern.  Skin intact.   Psychiatric: She has a normal mood and affect. Her behavior is normal.    ED Course  Procedures (including critical care time) DIAGNOSTIC STUDIES: Oxygen Saturation is 99% on RA, normal by my interpretation.    COORDINATION OF CARE: 10:00 AM- Pt advised of plan for treatment and pt agrees.   MDM   Final diagnoses:  Exposure to varicella zoster virus (VZV)    No indication for post exposure prophylaxis as patient  has been vaccinated and hx of varicella.  She is NOT immunocompromised, and a healthy female.  VSS, NAD.  No evidence of shingles on exam.  Discussed return precautions.  Follow up PCP.  Patient agrees and acknowledges the above plan for discharge.   I personally performed the services described in this documentation, which was scribed in my presence. The recorded information has been reviewed and is accurate.   Gloriann Loan, PA-C 09/06/15 [place], MD 09/06/15 1557

## 2015-09-06 NOTE — Discharge Instructions (Signed)
Shingles  Shingles, which is also known as herpes zoster, is an infection that causes a painful skin rash and fluid-filled blisters. Shingles is not related to genital herpes, which is a sexually transmitted infection.   Shingles only develops in people who:  Have had chickenpox.  Have received the chickenpox vaccine. (This is rare.) CAUSES  Shingles is caused by varicella-zoster virus (VZV). This is the same virus that causes chickenpox. After exposure to VZV, the virus stays in the body in an inactive (dormant) state. Shingles develops if the virus reactivates. This can happen many years after the initial exposure to VZV. It is not known what causes this virus to reactivate.  RISK FACTORS  People who have had chickenpox or received the chickenpox vaccine are at risk for shingles. Infection is more common in people who:  Are older than age 39.  Have a weakened defense (immune) system, such as those with HIV, AIDS, or cancer.  Are taking medicines that weaken the immune system, such as transplant medicines.  Are under great stress. SYMPTOMS  Early symptoms of this condition include itching, tingling, and pain in an area on your skin. Pain may be described as burning, stabbing, or throbbing.  A few days or weeks after symptoms start, a painful red rash appears, usually on one side of the body in a bandlike or beltlike pattern. The rash eventually turns into fluid-filled blisters that break open, scab over, and dry up in about 2-3 weeks.  At any time during the infection, you may also develop:  A fever.  Chills.  A headache.  An upset stomach. DIAGNOSIS  This condition is diagnosed with a skin exam. Sometimes, skin or fluid samples are taken from the blisters before a diagnosis is made. These samples are examined under a microscope or sent to a lab for testing.  TREATMENT  There is no specific cure for this condition. Your health care provider will probably prescribe medicines to help you  manage pain, recover more quickly, and avoid long-term problems. Medicines may include:  Antiviral drugs.  Anti-inflammatory drugs.  Pain medicines. If the area involved is on your face, you may be referred to a specialist, such as an eye doctor (ophthalmologist) or an ear, nose, and throat (ENT) doctor to help you avoid eye problems, chronic pain, or disability.  HOME CARE INSTRUCTIONS  Medicines  Take medicines only as directed by your health care provider.  Apply an anti-itch or numbing cream to the affected area as directed by your health care provider. Blister and Rash Care  Take a cool bath or apply cool compresses to the area of the rash or blisters as directed by your health care provider. This may help with pain and itching.  Keep your rash covered with a loose bandage (dressing). Wear loose-fitting clothing to help ease the pain of material rubbing against the rash.  Keep your rash and blisters clean with mild soap and cool water or as directed by your health care provider.  Check your rash every day for signs of infection. These include redness, swelling, and pain that lasts or increases.  Do not pick your blisters.  Do not scratch your rash. General Instructions  Rest as directed by your health care provider.  Keep all follow-up visits as directed by your health care provider. This is important.  Until your blisters scab over, your infection can cause chickenpox in people who have never had it or been vaccinated against it. To prevent this from happening, avoid  contact with other people, especially:  Babies.  Pregnant women.  Children who have eczema.  Elderly people who have transplants.  People who have chronic illnesses, such as leukemia or AIDS. SEEK MEDICAL CARE IF:  Your pain is not relieved with prescribed medicines.  Your pain does not get better after the rash heals.  Your rash looks infected. Signs of infection include redness, swelling, and pain that lasts or  increases. SEEK IMMEDIATE MEDICAL CARE IF:  The rash is on your face or nose.  You have facial pain, pain around your eye area, or loss of feeling on one side of your face.  You have ear pain or you have ringing in your ear.  You have loss of taste.  Your condition gets worse. This information is not intended to replace advice given to you by your health care provider. Make sure you discuss any questions you have with your health care provider.  Document Released: 06/24/2005 Document Revised: 07/15/2014 Document Reviewed: 05/05/2014  Elsevier Interactive Patient Education [address].    Contact Precautions Some germs can be spread by touching the skin or body fluids of someone else. Contact precautions are rules that help to keep those germs from spreading from one person to another person. RULES FOR PATIENTS  Check with your nurse before you leave the room where you are being treated.  Wear a gown and a bandage (dressing) over the infected area if you need to leave the room where you are being treated.  Wash your hands often. RULES FOR VISITORS  Check with a nurse before you go into a room that has a sign that says "Contact Precautions."  If a nurse says that you can go in the room, wash your hands and put on a gown and gloves.  Do not take off your gown or gloves in the room until you are leaving.  Do not eat or drink in the room unless you ask a nurse first.  Do not touch anything in the room unless you ask a nurse first.  Right before you leave the room, take off your gown and then your gloves. Leave them in the room as told. Then wash your hands.   This information is not intended to replace advice given to you by your health care provider. Make sure you discuss any questions you have with your health care provider.   Document Released: 07/27/2010 Document Revised: 11/08/2014 Document Reviewed: 05/29/2014 Elsevier Interactive Patient Education [employer].

## 2015-09-06 NOTE — ED Notes (Signed)
Patient given mask to wear. 

## 2015-09-06 NOTE — ED Notes (Signed)
Pt here because she hugged somebody with shingles. sts no symptoms.

## 2015-10-18 ENCOUNTER — Ambulatory Visit: Payer: Self-pay | Admitting: Internal Medicine

## 2015-10-18 ENCOUNTER — Encounter: Payer: Self-pay | Admitting: Internal Medicine

## 2015-11-09 ENCOUNTER — Ambulatory Visit: Payer: Self-pay | Admitting: Internal Medicine

## 2015-11-10 ENCOUNTER — Ambulatory Visit: Payer: Self-pay | Admitting: Internal Medicine

## 2015-11-10 ENCOUNTER — Encounter: Payer: Self-pay | Admitting: Internal Medicine

## 2015-11-14 ENCOUNTER — Ambulatory Visit: Payer: Medicaid Other | Admitting: Obstetrics

## 2015-11-27 ENCOUNTER — Telehealth: Payer: Self-pay | Admitting: *Deleted

## 2015-11-27 DIAGNOSIS — N390 Urinary tract infection, site not specified: Secondary | ICD-10-CM

## 2015-11-27 MED ORDER — NITROFURANTOIN MONOHYD MACRO 100 MG PO CAPS: 100.0000 mg | ORAL_CAPSULE | Freq: Two times a day (BID) | ORAL | Status: DC

## 2015-11-27 NOTE — Telephone Encounter (Signed)
Patient is calling with symptoms of UTI. 3:46 Call to patient- Rx to be sent to her [place]- if it does not get better - please let us know and we will culture.

## 2016-01-02 ENCOUNTER — Telehealth: Payer: Self-pay | Admitting: Internal Medicine

## 2016-01-02 NOTE — Telephone Encounter (Signed)
Asking for anxiety rx

## 2016-01-03 NOTE — Telephone Encounter (Signed)
Called pt before lunch, she states she would like something called in for anxiety/ nerves, states she had a death in family and needs something, offered an appt refused, she is ask to go to ED for eval, she is agreeable

## 2016-02-12 ENCOUNTER — Ambulatory Visit (INDEPENDENT_AMBULATORY_CARE_PROVIDER_SITE_OTHER): Payer: Medicaid Other | Admitting: Obstetrics and Gynecology

## 2016-02-12 ENCOUNTER — Ambulatory Visit (INDEPENDENT_AMBULATORY_CARE_PROVIDER_SITE_OTHER): Payer: Medicaid Other | Admitting: *Deleted

## 2016-02-12 ENCOUNTER — Encounter: Payer: Self-pay | Admitting: Obstetrics and Gynecology

## 2016-02-12 VITALS — BP 111/80 | HR 75 | Ht 66.0 in | Wt 145.0 lb

## 2016-02-12 DIAGNOSIS — R319 Hematuria, unspecified: Secondary | ICD-10-CM

## 2016-02-12 DIAGNOSIS — N946 Dysmenorrhea, unspecified: Secondary | ICD-10-CM | POA: Diagnosis not present

## 2016-02-12 DIAGNOSIS — Z113 Encounter for screening for infections with a predominantly sexual mode of transmission: Secondary | ICD-10-CM

## 2016-02-12 DIAGNOSIS — Z111 Encounter for screening for respiratory tuberculosis: Secondary | ICD-10-CM | POA: Diagnosis present

## 2016-02-12 DIAGNOSIS — Z1231 Encounter for screening mammogram for malignant neoplasm of breast: Secondary | ICD-10-CM

## 2016-02-12 LAB — POCT URINALYSIS DIPSTICK
BILIRUBIN UA: NEGATIVE
GLUCOSE UA: NEGATIVE
Ketones, UA: NEGATIVE
Spec Grav, UA: 1.01
UROBILINOGEN UA: 0.2
pH, UA: 6

## 2016-02-12 NOTE — Progress Notes (Signed)
Having menstrual cramps, blood in urine.

## 2016-02-12 NOTE — Progress Notes (Signed)
40 yo G2P1011 presenting today for the evaluation of hematuria and lower abdominal cramping. Patient reports recurrent urinary track infections. Her most recent one was a month ago. She is sexually active and would like to be screened for STD. Patient denies any abnormal discharge. She had a hysterectomy secondary to DUB. Her ovaries are still in place  Past Medical History:  Diagnosis Date  . Adenomyosis   . Anemia   . Anxiety   . Arthritis   . Asthma    daily inhaler use  . Bartholin's cyst    hx of  . Bronchitis   . Chronic low back pain    buldging disc  . Endometriosis   . GERD (gastroesophageal reflux disease)   . Headache(784.0)    often but no migraines  . History of bladder infections   . History of blood transfusion June 2013  . Hypertension    but not on medication  . Menometrorrhagia   . Pulmonary embolism ([place])    11/2011/ states no since  anticoagulation 1/14  . Shortness of breath    due to asthma  . Urinary tract infection    Past Surgical History:  Procedure Laterality Date  . ABDOMINAL HYSTERECTOMY    . BACK SURGERY     07/2011  . BILATERAL SALPINGECTOMY Bilateral 10/09/2012   Procedure: BILATERAL SALPINGECTOMY;  Surgeon: Lahoma Crocker, MD;  Location: [facility];  Service: Gynecology;  Laterality: Bilateral;  . CERVICAL SPINE SURGERY  2009  . CESAREAN SECTION  1996  . CHOLECYSTECTOMY, LAPAROSCOPIC  1998  . DILATION AND CURETTAGE OF UTERUS    . HYSTEROSCOPY  04/02/2012   Procedure: HYSTEROSCOPY;  Surgeon: Lahoma Crocker, MD;  Location: [facility];  Service: Gynecology;  Laterality: N/A;  with Dilitation and curretage and attempted hydrothermal ablation  . LAPAROSCOPIC TUBAL LIGATION  02/12/2012   Procedure: LAPAROSCOPIC TUBAL LIGATION;  Surgeon: Lahoma Crocker, MD;  Location: [facility];  Service: Gynecology;  Laterality: Bilateral;  . LAPAROSCOPY  at age 72  . LUMBAR SPINE SURGERY    . ROBOTIC ASSISTED LAP VAGINAL HYSTERECTOMY N/A 10/09/2012   Procedure:  ROBOTIC ASSISTED LAPAROSCOPIC VAGINAL HYSTERECTOMY;  Surgeon: Lahoma Crocker, MD;  Location: [facility];  Service: Gynecology;  Laterality: N/A;  . TUBAL LIGATION    . UMBILICAL HERNIA REPAIR     as a child   Family History  Problem Relation Age of Onset  . Hypertension Mother   . Cancer Maternal Aunt   . Cancer Maternal Uncle   . Diabetes Maternal Grandmother   . Anesthesia problems Neg Hx   . Hypotension Neg Hx   . Malignant hyperthermia Neg Hx   . Pseudochol deficiency Neg Hx   . Stroke Other     aunt  . Sickle cell anemia Other    Social History  Substance Use Topics  . Smoking status: Former Smoker    Packs/day: 0.50    Types: Cigarettes, Cigars    Start date: 07/09/1995    Quit date: 11/20/2011  . Smokeless tobacco: Never Used     Comment: pt quit cigarettes in 2009, pt smoke 2 black and milds a day until 09-2011  . Alcohol use 0.0 oz/week     Comment: Occasionally, "maybe 1-2 times/month"   ROS See pertinent in HPI  Blood pressure 111/80, pulse 75, height 5\' 6"  (1.676 m), weight 145 lb (65.8 kg), last menstrual period 05/01/2012. GENERAL: Well-developed, well-nourished female in no acute distress.  BREASTS: Symmetric in size. No palpable masses or lymphadenopathy, skin  changes, or nipple drainage. ABDOMEN: Soft, nontender, nondistended. No organomegaly. PELVIC: Normal external female genitalia. Vagina is pink and rugated.  Normal discharge. No adnexal mass or tenderness. EXTREMITIES: No cyanosis, clubbing, or edema, 2+ distal pulses.   A/P 40 yo with hematuria - Urine culture collected and will treat pending sensitivities - GC/Cl and wet prep collected. Patient agreed to HIV, RPP and Hep as she reports she had it done a few months ago - referral for breast mammogram provided - Patient will be contacted with any abnormal results - RTC prn

## 2016-02-13 LAB — RPR: RPR Ser Ql: NONREACTIVE

## 2016-02-13 LAB — HIV ANTIBODY (ROUTINE TESTING W REFLEX): HIV SCREEN 4TH GENERATION: NONREACTIVE

## 2016-02-13 LAB — HEPATITIS B SURFACE ANTIGEN: HEP B S AG: NEGATIVE

## 2016-02-13 LAB — HEPATITIS C ANTIBODY: HEP C VIRUS AB: 0.1 {s_co_ratio} (ref 0.0–0.9)

## 2016-02-14 LAB — URINE CULTURE

## 2016-02-15 ENCOUNTER — Other Ambulatory Visit: Payer: Self-pay | Admitting: Obstetrics and Gynecology

## 2016-02-15 MED ORDER — PHENAZOPYRIDINE HCL 100 MG PO TABS: 100.0000 mg | ORAL_TABLET | Freq: Three times a day (TID) | ORAL | 0 refills | Status: DC | PRN

## 2016-02-15 MED ORDER — CEPHALEXIN 500 MG PO CAPS: 500.0000 mg | ORAL_CAPSULE | Freq: Four times a day (QID) | ORAL | 0 refills | Status: DC

## 2016-02-15 NOTE — Addendum Note (Signed)
Addended by: Mora Bellman on: 02/15/2016 08:27 PM   Modules accepted: Orders

## 2016-02-16 ENCOUNTER — Telehealth: Payer: Self-pay

## 2016-02-16 NOTE — Telephone Encounter (Signed)
Patient made aware that she has a urinary tract infection and that Dr. Elly Modena has called her in an antibiotic and pyridium. Gave patient the warning that the pyridium make change the color of her urine. Patient states understanding.   Patient also asked for results of blood work. Patient given result of RPR HIV and Hep C. Kathrene Alu RNBSN

## 2016-02-16 NOTE — Telephone Encounter (Signed)
-----   Message from Mora Bellman, MD sent at 02/15/2016  8:22 PM EDT ----- Please inform patient of UTI. Rx Keflex and pyridium has been e-prescribed. Please inform patient that the pyridium will help with the cramping she has been experiencing. It will turn her urine orange/red which is a normal side effect  Thanks  Peggy

## 2016-02-17 LAB — NUSWAB VG+, CANDIDA 6SP
ATOPOBIUM VAGINAE: HIGH {score} — AB
CANDIDA GLABRATA, NAA: NEGATIVE
CANDIDA KRUSEI, NAA: NEGATIVE
CHLAMYDIA TRACHOMATIS, NAA: NEGATIVE
Candida albicans, NAA: POSITIVE — AB
Candida lusitaniae, NAA: NEGATIVE
Candida parapsilosis, NAA: NEGATIVE
Candida tropicalis, NAA: NEGATIVE
MEGASPHAERA 1: HIGH {score} — AB
Neisseria gonorrhoeae, NAA: NEGATIVE
TRICH VAG BY NAA: NEGATIVE

## 2016-02-19 ENCOUNTER — Other Ambulatory Visit: Payer: Self-pay | Admitting: Obstetrics and Gynecology

## 2016-02-19 ENCOUNTER — Telehealth: Payer: Self-pay

## 2016-02-19 MED ORDER — FLUCONAZOLE 150 MG PO TABS: 150.0000 mg | ORAL_TABLET | Freq: Once | ORAL | 0 refills | Status: AC

## 2016-02-19 MED ORDER — METRONIDAZOLE 500 MG PO TABS: 500.0000 mg | ORAL_TABLET | Freq: Two times a day (BID) | ORAL | 0 refills | Status: DC

## 2016-02-19 NOTE — Telephone Encounter (Signed)
Left message for patient to return call to [facility] for results. Kathrene Alu RN BSN

## 2016-02-19 NOTE — Telephone Encounter (Signed)
-----   Message from Mora Bellman, MD sent at 02/19/2016 10:56 AM EDT ----- Please inform patient of both yeast and BV infection. Rxs have been e-prescribed  Thanks  [employer]

## 2016-02-20 ENCOUNTER — Ambulatory Visit: Payer: Self-pay

## 2016-02-27 ENCOUNTER — Ambulatory Visit: Payer: Self-pay | Admitting: Obstetrics

## 2016-03-13 ENCOUNTER — Ambulatory Visit: Payer: Self-pay

## 2016-03-13 ENCOUNTER — Telehealth: Payer: Self-pay | Admitting: Internal Medicine

## 2016-03-13 NOTE — Telephone Encounter (Signed)
APT. REMINDER CALL, LMTCB °

## 2016-03-14 ENCOUNTER — Encounter: Payer: Self-pay | Admitting: Internal Medicine

## 2016-03-14 ENCOUNTER — Ambulatory Visit (INDEPENDENT_AMBULATORY_CARE_PROVIDER_SITE_OTHER): Payer: Medicaid Other | Admitting: Internal Medicine

## 2016-03-14 ENCOUNTER — Ambulatory Visit: Payer: Medicaid Other | Admitting: Pharmacist

## 2016-03-14 DIAGNOSIS — Z79899 Other long term (current) drug therapy: Secondary | ICD-10-CM

## 2016-03-14 DIAGNOSIS — R059 Cough, unspecified: Secondary | ICD-10-CM

## 2016-03-14 DIAGNOSIS — F4321 Adjustment disorder with depressed mood: Secondary | ICD-10-CM

## 2016-03-14 DIAGNOSIS — J45901 Unspecified asthma with (acute) exacerbation: Secondary | ICD-10-CM | POA: Diagnosis present

## 2016-03-14 DIAGNOSIS — R05 Cough: Secondary | ICD-10-CM

## 2016-03-14 DIAGNOSIS — R0602 Shortness of breath: Secondary | ICD-10-CM

## 2016-03-14 MED ORDER — DEXTROMETHORPHAN HBR 15 MG/5ML PO SYRP: 10.0000 mL | ORAL_SOLUTION | Freq: Four times a day (QID) | ORAL | 0 refills | Status: DC | PRN

## 2016-03-14 MED ORDER — BENZONATATE 100 MG PO CAPS: 100.0000 mg | ORAL_CAPSULE | Freq: Three times a day (TID) | ORAL | 0 refills | Status: DC | PRN

## 2016-03-14 MED ORDER — LORAZEPAM 0.5 MG PO TABS: 0.5000 mg | ORAL_TABLET | Freq: Every evening | ORAL | 0 refills | Status: DC | PRN

## 2016-03-14 MED ORDER — BUDESONIDE-FORMOTEROL FUMARATE 80-4.5 MCG/ACT IN AERO: 2.0000 | INHALATION_SPRAY | Freq: Two times a day (BID) | RESPIRATORY_TRACT | 5 refills | Status: DC

## 2016-03-14 MED ORDER — ALBUTEROL SULFATE HFA 108 (90 BASE) MCG/ACT IN AERS: 2.0000 | INHALATION_SPRAY | RESPIRATORY_TRACT | 6 refills | Status: DC | PRN

## 2016-03-14 NOTE — Assessment & Plan Note (Signed)
Patient presents with 1 week history of cough with some production of yellow sputum. She says that it started abruptly. She denies sore throat, lymphadenopathy, trouble swallowing, sinus tenderness or headaches. Denies systemic symptoms.  She has concurrent asthma and has been using her albuterol 2-3 times a day and borrowed her daughters as she ran out of hers. Normally she uses every other day.   On exam, HEENT exam was normal with no erythema or exudates, and no wheezing heard on exam.  A: acute bronchitis in a patient with asthma  Plan -prescribed dextromethorphan and tessalon pearls  -advised to RTC if symptoms worsen or do not improve in a week -can consider a short course of steroids if symptoms do not improve

## 2016-03-14 NOTE — Progress Notes (Signed)
Medication Samples have been provided to the patient.  Drug name: PROVENTIL (ALBUTEROL)       Strength: 108 MCG/ACT        Qty: 1 INH  LOT: [id number]  Exp.Date: 11/2017  Dosing instructions: Inhale 2 puffs every 4 hours as needed for wheezing and shortness of breath  Drug name: DULERA       Strength: 100/5 MCG/ACT        Qty: 1 INH  LOT: [id number]  Exp.Date: 07/2016  Dosing instructions: Inhale 2 puffs into the lungs 2 times daily  The patient has been instructed regarding the correct time, dose, and frequency of taking this medication, including desired effects and most common side effects.   Megan Chandler 12:26 PM 03/14/2016

## 2016-03-14 NOTE — Assessment & Plan Note (Addendum)
Patient was crying during the entire exam as her father died from lung cancer at the age of 83, and it happened in [place] 2 weeks ago. She said they found out about this 2 months ago and have not had time to really prepare for the news. She says she has a lot of insomnia. I offered support in the room   A: acute grief reaction  P -offered support -advised that seeing a pastor may help  Patient later requested refill of xanax. Given that she has been going through grief- I gave her 0.5 mg ativan to be taken as needed at night time- 15 tablets with no refills.

## 2016-03-14 NOTE — Progress Notes (Signed)
   CC: cough and acute grief HPI: [name] is a 40 y.o. woman with PMH noted below here for cough and acute grief  Please see Problem List/A&P for the status of the patient's chronic medical problems   Past Medical History:  Diagnosis Date  . Adenomyosis   . Anemia   . Anxiety   . Arthritis   . Asthma    daily inhaler use  . Bartholin's cyst    hx of  . Bronchitis   . Chronic low back pain    buldging disc  . Endometriosis   . GERD (gastroesophageal reflux disease)   . Headache(784.0)    often but no migraines  . History of bladder infections   . History of blood transfusion June 2013  . Hypertension    but not on medication  . Menometrorrhagia   . Pulmonary embolism ([place])    11/2011/ states no since  anticoagulation 1/14  . Shortness of breath    due to asthma  . Urinary tract infection     Review of Systems: Denies fevers, chills, fatigue, or sick contacts. Father died 2 weeks ago  Denies headaches. Has cough with some productive sputum yellow. Denies rhinorrhea or sinus tenderness. Denies sore throat Denies n/v/abd pain. Has some diarrhea Denies myalgias or joint pain  Physical Exam: Vitals:   03/14/16 1024  BP: 112/82  Pulse: 71  Temp: 98.5 F (36.9 C)  TempSrc: Oral  SpO2: 100%  Weight: 143 lb 1.6 oz (64.9 kg)  Height: 5\' 6"  (1.676 m)    General: A&O, in NAD HEENT: MMM, no oropharyngeal exudates. No lymphadenopathy. No sinus tenderness CV: RRR, normal s1, s2, no m/r/g,  Resp: equal and symmetric breath sounds, no wheezing or crackles  Abdomen: soft, nontender, nondistended, +BS   Assessment & Plan:   See encounters tab for problem based medical decision making. Patient discussed with Dr. Daryll Drown

## 2016-03-14 NOTE — Patient Instructions (Signed)
Thank you for your visit today  Please take the cough syrup and tessalon pearls for your cough  Please continue to use your inhalers  Please return to clinic in 7-10 days if your symptoms do not improve   Upper Respiratory Infection, Adult Most upper respiratory infections (URIs) are a viral infection of the air passages leading to the lungs. A URI affects the nose, throat, and upper air passages. The most common type of URI is nasopharyngitis and is typically referred to as "the common cold." URIs run their course and usually go away on their own. Most of the time, a URI does not require medical attention, but sometimes a bacterial infection in the upper airways can follow a viral infection. This is called a secondary infection. Sinus and middle ear infections are common types of secondary upper respiratory infections. Bacterial pneumonia can also complicate a URI. A URI can worsen asthma and chronic obstructive pulmonary disease (COPD). Sometimes, these complications can require emergency medical care and may be life threatening.  CAUSES Almost all URIs are caused by viruses. A virus is a type of germ and can spread from one person to another.  RISKS FACTORS You may be at risk for a URI if:   You smoke.   You have chronic heart or lung disease.  You have a weakened defense (immune) system.   You are very young or very old.   You have nasal allergies or asthma.  You work in crowded or poorly ventilated areas.  You work in health care facilities or schools. SIGNS AND SYMPTOMS  Symptoms typically develop 2-3 days after you come in contact with a cold virus. Most viral URIs last 7-10 days. However, viral URIs from the influenza virus (flu virus) can last 14-18 days and are typically more severe. Symptoms may include:   Runny or stuffy (congested) nose.   Sneezing.   Cough.   Sore throat.   Headache.   Fatigue.   Fever.   Loss of appetite.   Pain in your  forehead, behind your eyes, and over your cheekbones (sinus pain).  Muscle aches.  DIAGNOSIS  Your health care provider may diagnose a URI by:  Physical exam.  Tests to check that your symptoms are not due to another condition such as:  Strep throat.  Sinusitis.  Pneumonia.  Asthma. TREATMENT  A URI goes away on its own with time. It cannot be cured with medicines, but medicines may be prescribed or recommended to relieve symptoms. Medicines may help:  Reduce your fever.  Reduce your cough.  Relieve nasal congestion. HOME CARE INSTRUCTIONS   Take medicines only as directed by your health care provider.   Gargle warm saltwater or take cough drops to comfort your throat as directed by your health care provider.  Use a warm mist humidifier or inhale steam from a shower to increase air moisture. This may make it easier to breathe.  Drink enough fluid to keep your urine clear or pale yellow.   Eat soups and other clear broths and maintain good nutrition.   Rest as needed.   Return to work when your temperature has returned to normal or as your health care provider advises. You may need to stay home longer to avoid infecting others. You can also use a face mask and careful hand washing to prevent spread of the virus.  Increase the usage of your inhaler if you have asthma.   Do not use any tobacco products, including cigarettes, chewing tobacco,  or electronic cigarettes. If you need help quitting, ask your health care provider. PREVENTION  The best way to protect yourself from getting a cold is to practice good hygiene.   Avoid oral or hand contact with people with cold symptoms.   Wash your hands often if contact occurs.  There is no clear evidence that vitamin C, vitamin E, echinacea, or exercise reduces the chance of developing a cold. However, it is always recommended to get plenty of rest, exercise, and practice good nutrition.  SEEK MEDICAL CARE IF:   You  are getting worse rather than better.   Your symptoms are not controlled by medicine.   You have chills.  You have worsening shortness of breath.  You have brown or red mucus.  You have yellow or brown nasal discharge.  You have pain in your face, especially when you bend forward.  You have a fever.  You have swollen neck glands.  You have pain while swallowing.  You have white areas in the back of your throat. SEEK IMMEDIATE MEDICAL CARE IF:   You have severe or persistent:  Headache.  Ear pain.  Sinus pain.  Chest pain.  You have chronic lung disease and any of the following:  Wheezing.  Prolonged cough.  Coughing up blood.  A change in your usual mucus.  You have a stiff neck.  You have changes in your:  Vision.  Hearing.  Thinking.  Mood. MAKE SURE YOU:   Understand these instructions.  Will watch your condition.  Will get help right away if you are not doing well or get worse.   This information is not intended to replace advice given to you by your health care provider. Make sure you discuss any questions you have with your health care provider.   Document Released: 12/18/2000 Document Revised: 11/08/2014 Document Reviewed: 09/29/2013 Elsevier Interactive Patient Education [employer].

## 2016-03-15 ENCOUNTER — Telehealth: Payer: Self-pay | Admitting: *Deleted

## 2016-03-15 NOTE — Progress Notes (Signed)
Patient was seen in clinic with Terald Sleeper, PharmD candidate. I agree with the assessment and plan of care documented.

## 2016-03-15 NOTE — Telephone Encounter (Addendum)
Call to [employer] for Prior Authorization for Symbicort .  Approved starting 03/15/2016 thru 09/11/2016.  [facility] was called at [phone].  Sander Nephew, RN 03/14/2016 2:54 PM RTC to [facility]. Tracks spoke with representative about prior authorization approval and that pharmacy could not run claim through.  Corrected amount and approval was done.  Call to [facility] to run claim again.  Sander Nephew, RN 03/22/2016 9:48 AM

## 2016-03-18 NOTE — Progress Notes (Signed)
Internal Medicine Clinic Attending  Case discussed with Dr. Saraiya soon after the resident saw the patient.  We reviewed the resident's history and exam and pertinent patient test results.  I agree with the assessment, diagnosis, and plan of care documented in the resident's note.  

## 2016-04-11 ENCOUNTER — Other Ambulatory Visit: Payer: Self-pay | Admitting: Orthopedic Surgery

## 2016-04-11 DIAGNOSIS — M5416 Radiculopathy, lumbar region: Secondary | ICD-10-CM

## 2016-04-26 ENCOUNTER — Other Ambulatory Visit: Payer: Self-pay

## 2016-05-04 ENCOUNTER — Ambulatory Visit
Admission: RE | Admit: 2016-05-04 | Discharge: 2016-05-04 | Disposition: A | Discharge status: Home / Self Care | Payer: Medicaid Other | Source: Ambulatory Visit | Attending: Orthopedic Surgery | Admitting: Orthopedic Surgery

## 2016-05-04 DIAGNOSIS — M5416 Radiculopathy, lumbar region: Secondary | ICD-10-CM

## 2016-07-17 ENCOUNTER — Ambulatory Visit: Payer: Self-pay | Admitting: Obstetrics

## 2016-07-23 ENCOUNTER — Other Ambulatory Visit: Payer: Self-pay | Admitting: Internal Medicine

## 2016-07-23 NOTE — Telephone Encounter (Signed)
Patient requesting a refill on benzonatate (TESSALON PERLES) 100 MG capsule

## 2016-07-24 MED ORDER — BENZONATATE 100 MG PO CAPS: 100.0000 mg | ORAL_CAPSULE | Freq: Three times a day (TID) | ORAL | 0 refills | Status: DC | PRN

## 2016-07-29 ENCOUNTER — Encounter: Payer: Self-pay | Admitting: Internal Medicine

## 2016-07-29 ENCOUNTER — Telehealth: Payer: Self-pay | Admitting: Internal Medicine

## 2016-07-29 ENCOUNTER — Ambulatory Visit (INDEPENDENT_AMBULATORY_CARE_PROVIDER_SITE_OTHER): Payer: Medicaid Other | Admitting: Internal Medicine

## 2016-07-29 VITALS — BP 99/64 | HR 86 | Temp 98.0°F | Ht 66.0 in | Wt 151.0 lb

## 2016-07-29 DIAGNOSIS — Z87891 Personal history of nicotine dependence: Secondary | ICD-10-CM

## 2016-07-29 DIAGNOSIS — J209 Acute bronchitis, unspecified: Secondary | ICD-10-CM

## 2016-07-29 DIAGNOSIS — K219 Gastro-esophageal reflux disease without esophagitis: Secondary | ICD-10-CM | POA: Diagnosis not present

## 2016-07-29 DIAGNOSIS — J45909 Unspecified asthma, uncomplicated: Secondary | ICD-10-CM | POA: Diagnosis not present

## 2016-07-29 MED ORDER — AMOXICILLIN-POT CLAVULANATE 875-125 MG PO TABS: 1.0000 | ORAL_TABLET | Freq: Two times a day (BID) | ORAL | 0 refills | Status: AC

## 2016-07-29 MED ORDER — BUDESONIDE-FORMOTEROL FUMARATE 80-4.5 MCG/ACT IN AERO: 2.0000 | INHALATION_SPRAY | Freq: Two times a day (BID) | RESPIRATORY_TRACT | 5 refills | Status: DC

## 2016-07-29 MED ORDER — PANTOPRAZOLE SODIUM 40 MG PO TBEC: 40.0000 mg | DELAYED_RELEASE_TABLET | Freq: Every day | ORAL | 0 refills | Status: DC

## 2016-07-29 MED ORDER — DM-GUAIFENESIN ER 30-600 MG PO TB12
1.0000 | ORAL_TABLET | Freq: Two times a day (BID) | ORAL | 0 refills | Status: DC
Start: 2016-07-29 — End: 2016-12-04

## 2016-07-29 NOTE — Telephone Encounter (Signed)
   Reason for call:   I received a call from [name] at 7:41 PM indicating she was prescribed Augmentin by a doctor in the clinic today but cannot take this medication because she is allergic to penicillins (anaplylaxis and hives). States she did not pick up this medication from the pharmacy.    Pertinent Data:   Complaining of cough productive of green colored sputum and R sided pleuritic chest pain for the past few weeks. States the cough is especially worse at night.  Also reports having a diarrhea, fatigue, diaphoresis, and intermittent nausea. Denies any episodes of vomiting. Reports having intermittent lightheadedness which gets better after she sits down. States she has a "big appetite" and is drinking plenty of fluids.   Denies having any fevers, chills, SOB, or body aches.   Reports having a burning sensation in her abdomen and a "pharmaceutical" taste in her mouth intermittently since 2013, however, symptoms have been worse for the past 3 months. States she took Prilosec in 2013 but it did not help much.     Assessment / Plan / Recommendations:   Differentials include influenza, bronchitis, and pneumonia. I do not see complete documentation from the clinic provider in epic to understand why the patient was prescribed Augmentin.   Advised patient to go to an urgent care facility or ER for further evaluation and possible CXR to r/o PNA. Patient stated she will be taking her daughter to the clinic in the morning and would rather wait until then to see a physician. States she feels okay now and feels comfortable waiting until the morning.  Advised her not to take Augmentin as she is allergic to penicillins   Advised her to take Mucinex-DM for her cough, which was prescribed by her clinic provider earlier today. GERD is also likely to be playing a role. As such, prescribed Protonix 40 mg daily. Explained to the patient that since her symptoms have been going on since 2013,  she may need an EGD for further eval.   As always, pt is advised that if symptoms worsen or new symptoms arise, they should go to an urgent care facility or to to ER for further evaluation.   Megan Leff, MD   07/29/2016, 8:13 PM

## 2016-07-29 NOTE — Patient Instructions (Addendum)
Ms. Mosby,   I am going to give you an antibiotic today for your bronchitis.  You likely had a viral illness that predisposed you to getting a bacterial infection. It is called Augmentin. Please stop taking the Cephalexin and take this medication. It is a twice a day medication. Please take it until it runs out.    Please schedule an appointment with Dr. Hetty Ely in March.

## 2016-07-29 NOTE — Progress Notes (Signed)
   CC: Cough  HPI:  [name] is a 41 y.o. female with a past medical history listed below here today with complaints of cough.   [name] reports she has had a productive cough with green sputum production for the past 3 weeks. No blood that she has noticed. Coughing all day but is worst at night when laying down go to sleep. Does have some tightness and chest pain with coughing on her right chest. No fevers, chills. Has chronic nausea that is unchanged. Reports diarrhea during this period as well. 3-4 loose bowel movements. No hematochezia or melena. Reports daughter and cousin as sick contacts. Had left over antibiotics from her gynecologist, Cephalexin and took 5-6 pills over the past week, after the diarrhea began. Left eye swollen and irritated. Does report some shortness of breath. Has albuterol inhaler 1-2 a day at baseline and has not increased use over the past 3 weeks. Has been out of Symbicort for over a month. Has been taking Nyquil and Tessalon perles with no relief. Denies any myalgias. Mild congestion and rhinorrhea. No sinus pressure/pain, headache, ear pain. Does note some mild sore throat.   Does note bad acid reflux. Has daily symptoms with burning sensation. Not associated with meals. Reports nausea without emesis.    Past Medical History:  Diagnosis Date  . Adenomyosis   . Anemia   . Anxiety   . Arthritis   . Asthma    daily inhaler use  . Bartholin's cyst    hx of  . Bronchitis   . Chronic low back pain    buldging disc  . Endometriosis   . GERD (gastroesophageal reflux disease)   . Headache(784.0)    often but no migraines  . History of bladder infections   . History of blood transfusion June 2013  . Hypertension    but not on medication  . Menometrorrhagia   . Pulmonary embolism ([place])    11/2011/ states no since  anticoagulation 1/14  . Shortness of breath    due to asthma  . Urinary tract infection     Review of Systems:   Negative  except as noted in HPI  Physical Exam:  Vitals:   07/29/16 1539  BP: 99/64  Pulse: 86  Temp: 98 F (36.7 C)  TempSrc: Oral  SpO2: 100%  Weight: 151 lb (68.5 kg)  Height: 5\' 6"  (1.676 m)   Physical Exam  Constitutional: She is well-developed, well-nourished, and in no distress. No distress.  HENT:  Head: Normocephalic and atraumatic.  Right Ear: External ear normal.  Left Ear: External ear normal.  Nose: Nose normal.  Mouth/Throat: Oropharynx is clear and moist. No oropharyngeal exudate.  Eyes: Conjunctivae are normal. Pupils are equal, round, and reactive to light.  Neck: Normal range of motion. Neck supple.  Cardiovascular: Normal rate, regular rhythm and normal heart sounds.   Pulmonary/Chest: Effort normal and breath sounds normal. She has no wheezes. She has no rales.  Abdominal: Soft. Bowel sounds are normal. She exhibits no distension. There is no tenderness.  Lymphadenopathy:    She has no cervical adenopathy.  Skin: Skin is warm and dry.    Assessment & Plan:   See Encounters Tab for problem based charting.  Patient discussed with Dr. Dareen Piano

## 2016-07-30 MED ORDER — SULFAMETHOXAZOLE-TRIMETHOPRIM 800-160 MG PO TABS: 1.0000 | ORAL_TABLET | Freq: Two times a day (BID) | ORAL | 0 refills | Status: DC

## 2016-07-30 MED ORDER — CEFACLOR ER 500 MG PO TB12: 500.0000 mg | ORAL_TABLET | Freq: Two times a day (BID) | ORAL | 0 refills | Status: DC

## 2016-07-30 NOTE — Assessment & Plan Note (Signed)
Requesting refill of her symbicort today. Reports needing her albuterol inhaler 1-2 times daily since running out 2 months ago. Previously needed albuterol only sparingly. Denies any wheezing or SOB today.   Will refill symbicort today. Follow up with PCP.

## 2016-07-30 NOTE — Addendum Note (Signed)
Addended by: Grantley Lions on: 07/30/2016 05:01 PM   Modules accepted: Orders

## 2016-07-30 NOTE — Addendum Note (Signed)
Addended by: [place] Lions on: 07/30/2016 08:56 AM   Modules accepted: Orders

## 2016-07-30 NOTE — Assessment & Plan Note (Signed)
Does note bad acid reflux. Has daily symptoms with burning sensation. Not associated with meals. Reports nausea without emesis.   A/P Will give Rx for protonix 40 mg daily for 6 week trial

## 2016-07-30 NOTE — Assessment & Plan Note (Addendum)
  [name] reports she has had a productive cough with green sputum production for the past 3 weeks. No blood that she has noticed. Coughing all day but is worst at night when laying down go to sleep. Does have some tightness and chest pain with coughing on her right chest. No fevers, chills. Has chronic nausea that is unchanged. Reports diarrhea during this period as well. 3-4 loose bowel movements. No hematochezia or melena. Reports daughter and cousin as sick contacts. Had left over antibiotics from her gynecologist, Cephalexin and took 5-6 pills over the past week, after the diarrhea began. Left eye swollen and irritated. Does report some shortness of breath. Has albuterol inhaler 1-2 a day at baseline and has not increased use over the past 3 weeks. Has been out of Symbicort for over a month. Has been taking Nyquil and Tessalon perles with no relief. Denies any myalgias. Mild congestion and rhinorrhea. No sinus pressure/pain, headache, ear pain. Does note some mild sore throat.   Does note bad acid reflux. Has daily symptoms with burning sensation. Not associated with meals. Reports nausea without emesis.   A/P Bacterial bronchitis post viral URI Will treat with Augmentin 875-125 mg bid x 5 days and Mucinex DM  ADDENDUM Patient reportedly called in last night to the after hours pager stating that she had a penicillin allergy. Only thing documented in chart is a history of itching with penicillins but reportedly told overnight provider that she had a history of anaphylaxis. She had been intermittently taking Cephalexin without any reactions.  Due to limitations with her insurance and affordability, will give Rx for Bactrim double strength x 7 days.

## 2016-08-01 NOTE — Progress Notes (Signed)
[facility] Medicine Clinic Attending  Case discussed with Dr. Boswell at the time of the visit.  We reviewed the resident's history and exam and pertinent patient test results.  I agree with the assessment, diagnosis, and plan of care documented in the resident's note.  

## 2016-08-29 ENCOUNTER — Telehealth: Payer: Self-pay

## 2016-08-29 NOTE — Telephone Encounter (Signed)
Returned call, patient stated that she is having vaginal discomfort and knows that she has another bacterial infection, patient is requesting rx to be sent and diflucan for yeast after taking antibiotics. Routed to provider for review.

## 2016-08-30 ENCOUNTER — Other Ambulatory Visit: Payer: Self-pay | Admitting: Obstetrics

## 2016-08-30 ENCOUNTER — Ambulatory Visit: Payer: Self-pay

## 2016-08-30 ENCOUNTER — Telehealth: Payer: Self-pay

## 2016-08-30 ENCOUNTER — Encounter: Payer: Self-pay | Admitting: Internal Medicine

## 2016-08-30 DIAGNOSIS — B3731 Acute candidiasis of vulva and vagina: Secondary | ICD-10-CM

## 2016-08-30 DIAGNOSIS — N760 Acute vaginitis: Principal | ICD-10-CM

## 2016-08-30 DIAGNOSIS — B9689 Other specified bacterial agents as the cause of diseases classified elsewhere: Secondary | ICD-10-CM

## 2016-08-30 DIAGNOSIS — B373 Candidiasis of vulva and vagina: Secondary | ICD-10-CM

## 2016-08-30 MED ORDER — TINIDAZOLE 500 MG PO TABS: 1000.0000 mg | ORAL_TABLET | Freq: Every day | ORAL | 2 refills | Status: DC

## 2016-08-30 MED ORDER — FLUCONAZOLE 150 MG PO TABS: 150.0000 mg | ORAL_TABLET | Freq: Once | ORAL | 0 refills | Status: AC

## 2016-08-30 NOTE — Telephone Encounter (Signed)
Tindamax Rx for BV Diflucan Rx

## 2016-08-30 NOTE — Telephone Encounter (Signed)
Attempted to contact to advise of rx sent, no answer, left vm to call.

## 2016-11-04 ENCOUNTER — Ambulatory Visit: Payer: Self-pay | Admitting: Internal Medicine

## 2016-11-06 ENCOUNTER — Ambulatory Visit: Payer: Self-pay

## 2016-11-07 ENCOUNTER — Encounter: Payer: Self-pay | Admitting: Internal Medicine

## 2016-12-04 ENCOUNTER — Ambulatory Visit (INDEPENDENT_AMBULATORY_CARE_PROVIDER_SITE_OTHER): Payer: Medicaid Other | Admitting: Internal Medicine

## 2016-12-04 ENCOUNTER — Encounter: Payer: Self-pay | Admitting: Internal Medicine

## 2016-12-04 VITALS — BP 119/72 | HR 76 | Temp 98.5°F | Ht 66.0 in | Wt 146.5 lb

## 2016-12-04 DIAGNOSIS — Z86711 Personal history of pulmonary embolism: Secondary | ICD-10-CM | POA: Diagnosis not present

## 2016-12-04 DIAGNOSIS — R296 Repeated falls: Secondary | ICD-10-CM | POA: Diagnosis not present

## 2016-12-04 DIAGNOSIS — Z87891 Personal history of nicotine dependence: Secondary | ICD-10-CM

## 2016-12-04 DIAGNOSIS — Z9181 History of falling: Secondary | ICD-10-CM | POA: Diagnosis not present

## 2016-12-04 DIAGNOSIS — R05 Cough: Secondary | ICD-10-CM | POA: Diagnosis not present

## 2016-12-04 DIAGNOSIS — R0602 Shortness of breath: Secondary | ICD-10-CM

## 2016-12-04 LAB — D-DIMER, QUANTITATIVE: D-Dimer, Quant: 0.31 ug/mL-FEU (ref 0.00–0.50)

## 2016-12-04 LAB — BASIC METABOLIC PANEL
Anion gap: 9 (ref 5–15)
BUN: 6 mg/dL (ref 6–20)
CHLORIDE: 102 mmol/L (ref 101–111)
CO2: 26 mmol/L (ref 22–32)
CREATININE: 0.75 mg/dL (ref 0.44–1.00)
Calcium: 9.4 mg/dL (ref 8.9–10.3)
GFR calc non Af Amer: >60 mL/min (ref >60)
GLUCOSE: 94 mg/dL (ref 65–99)
Potassium: 3.7 mmol/L (ref 3.5–5.1)
Sodium: 137 mmol/L (ref 135–145)

## 2016-12-04 MED ORDER — ALBUTEROL SULFATE HFA 108 (90 BASE) MCG/ACT IN AERS: 2.0000 | INHALATION_SPRAY | RESPIRATORY_TRACT | 6 refills | Status: DC | PRN

## 2016-12-04 MED ORDER — BUDESONIDE-FORMOTEROL FUMARATE 80-4.5 MCG/ACT IN AERO: 2.0000 | INHALATION_SPRAY | Freq: Two times a day (BID) | RESPIRATORY_TRACT | 5 refills | Status: DC

## 2016-12-04 MED ORDER — DM-GUAIFENESIN ER 30-600 MG PO TB12: 1.0000 | ORAL_TABLET | Freq: Two times a day (BID) | ORAL | 0 refills | Status: DC

## 2016-12-04 NOTE — Progress Notes (Signed)
   CC: SOB and Cough  HPI:  [name] is a 41 y.o. female with a past medical history listed below here today with complaints of shortness of breath and cough.   She reports a 2 day history of SOB and cough. Cough worst when lying down flat on back or get 'over heated'. Keeping her up at night. Productive with clear, brown and yellow phlegm. Chills today. No fevers. SOB worst when lying flat as well. Chest pain with deep inspiration and coughing. Sharp pain. Reports it feels like when she had a blood clot in 2013. No worsening SOB or CP with exertion. Former smoker, quit 9 months ago. Reports being out of her asthma medications for 4-5 days ago. On Symbicort and albuterol. Uses albuterol daily regularly. No sore throat, ear pain, sinus congestion. Nauseated all the time. Reports rhinorrhea. History of unprovoked PE in 2013. RF at that time were smoking and oral contraceptives. Revised Geneva Score 3 (history). No tachycardia, tachypnea, ambulatory stats >95%. Stat D-Dimer today 0.31. No wheezing on exam.   Falling. Reports 3 falls in the past month. Reports no loss of sensation, no loss of bowel or bladder function, does report subjective weakness in both her legs and arms at times. Reports feeling like her legs are knocked out from beneath her.   Past Medical History:  Diagnosis Date  . Adenomyosis   . Anemia   . Anxiety   . Arthritis   . Asthma    daily inhaler use  . Bartholin's cyst    hx of  . Bronchitis   . Chronic low back pain    buldging disc  . Endometriosis   . GERD (gastroesophageal reflux disease)   . Headache(784.0)    often but no migraines  . History of bladder infections   . History of blood transfusion June 2013  . Hypertension    but not on medication  . Menometrorrhagia   . Pulmonary embolism ([place])    11/2011/ states no since  anticoagulation 1/14  . Shortness of breath    due to asthma  . Urinary tract infection     Review of Systems:   +SOB  +Pleuritic Chest pain +Chills +Cough No fever  Physical Exam:  Vitals:   12/04/16 1609  BP: 119/72  Pulse: 76  Temp: 98.5 F (36.9 C)  TempSrc: Oral  SpO2: 100%  Weight: 146 lb 8 oz (66.5 kg)  Height: 5\' 6"  (1.676 m)   Physical Exam  Constitutional: She is oriented to person, place, and time and well-developed, well-nourished, and in no distress. No distress.  HENT:  Head: Normocephalic and atraumatic.  Cardiovascular: Normal rate, regular rhythm and normal heart sounds.   Pulmonary/Chest: Effort normal and breath sounds normal. She has no wheezes. She has no rales. She exhibits no tenderness.  Musculoskeletal: She exhibits no edema.  Neurological: She is alert and oriented to person, place, and time. She has normal sensation, normal strength and normal reflexes.  Skin: Skin is warm and dry.  Psychiatric: Mood and affect normal.    Assessment & Plan:   See Encounters Tab for problem based charting.  Patient discussed with Dr. Evette Doffing

## 2016-12-04 NOTE — Patient Instructions (Addendum)
Megan Chandler,   I will give you a call with the results of your test.  Please follow up with Dr. Hetty Ely on June 19th at 3:45.

## 2016-12-05 ENCOUNTER — Other Ambulatory Visit: Payer: Self-pay | Admitting: Obstetrics and Gynecology

## 2016-12-05 ENCOUNTER — Telehealth: Payer: Self-pay

## 2016-12-05 ENCOUNTER — Other Ambulatory Visit: Payer: Self-pay | Admitting: Family Medicine

## 2016-12-05 MED ORDER — METRONIDAZOLE 500 MG PO TABS: 500.0000 mg | ORAL_TABLET | Freq: Two times a day (BID) | ORAL | 0 refills | Status: DC

## 2016-12-05 MED ORDER — FLUCONAZOLE 150 MG PO TABS: 150.0000 mg | ORAL_TABLET | Freq: Once | ORAL | 0 refills | Status: AC

## 2016-12-05 MED ORDER — FLUCONAZOLE 150 MG PO TABS: 150.0000 mg | ORAL_TABLET | Freq: Once | ORAL | 0 refills | Status: DC

## 2016-12-05 NOTE — Telephone Encounter (Signed)
Pt informed

## 2016-12-05 NOTE — Telephone Encounter (Signed)
Pt states that she called about a month or two ago. States that she has a bacterial infection and yeast infection. Complains of thick, white discharge with itching. States that she did have some odor, but this has now subsided. Pt has an appt on 6/26 for annual. Pt would like an rx sent to the pharmacy for BV and 2 tabs of diflucan sent to the pharmacy. Due to this being above what's in our protocol, I'm forwarding this to the provider

## 2016-12-09 DIAGNOSIS — R296 Repeated falls: Secondary | ICD-10-CM | POA: Insufficient documentation

## 2016-12-09 NOTE — Assessment & Plan Note (Addendum)
D-dimer negative today. No evidence of new PE. She has been out of her asthma medications for several days. No wheezing on exam today but does report significant wheezing at home and reports some relief with albuterol here.   Likely a viral URI with mild asthma exacerbation  Plan: Refill Symbicort and Albuterol today Mucinex DM for cough

## 2016-12-09 NOTE — Assessment & Plan Note (Signed)
Denies any alarm symptoms. Does have a history of left lumbosacral radiculopathy. Neuro exam reassuring.   Plan: No further work up today given lack of alarm symptoms and reassuring exam Follow up with PCP

## 2016-12-10 NOTE — Progress Notes (Signed)
[facility] Medicine Clinic Attending  Case discussed with Dr. Boswell at the time of the visit.  We reviewed the resident's history and exam and pertinent patient test results.  I agree with the assessment, diagnosis, and plan of care documented in the resident's note.  

## 2016-12-24 ENCOUNTER — Encounter: Payer: Self-pay | Admitting: Internal Medicine

## 2016-12-31 ENCOUNTER — Ambulatory Visit: Payer: Self-pay | Admitting: Obstetrics

## 2017-01-01 ENCOUNTER — Ambulatory Visit (INDEPENDENT_AMBULATORY_CARE_PROVIDER_SITE_OTHER): Payer: Medicaid Other | Admitting: Obstetrics

## 2017-01-01 ENCOUNTER — Encounter: Payer: Self-pay | Admitting: Obstetrics

## 2017-01-01 ENCOUNTER — Other Ambulatory Visit (HOSPITAL_COMMUNITY)
Admission: RE | Admit: 2017-01-01 | Discharge: 2017-01-01 | Disposition: A | Discharge status: Home / Self Care | Payer: Medicaid Other | Source: Ambulatory Visit | Attending: Obstetrics | Admitting: Obstetrics

## 2017-01-01 VITALS — BP 125/86 | HR 89 | Ht 66.0 in | Wt 151.4 lb

## 2017-01-01 DIAGNOSIS — Z01419 Encounter for gynecological examination (general) (routine) without abnormal findings: Secondary | ICD-10-CM | POA: Diagnosis not present

## 2017-01-01 DIAGNOSIS — F419 Anxiety disorder, unspecified: Secondary | ICD-10-CM | POA: Diagnosis not present

## 2017-01-01 DIAGNOSIS — R51 Headache: Secondary | ICD-10-CM

## 2017-01-01 DIAGNOSIS — G8929 Other chronic pain: Secondary | ICD-10-CM

## 2017-01-01 DIAGNOSIS — H532 Diplopia: Secondary | ICD-10-CM | POA: Diagnosis not present

## 2017-01-01 DIAGNOSIS — K219 Gastro-esophageal reflux disease without esophagitis: Secondary | ICD-10-CM | POA: Diagnosis not present

## 2017-01-01 DIAGNOSIS — R110 Nausea: Secondary | ICD-10-CM

## 2017-01-01 DIAGNOSIS — R61 Generalized hyperhidrosis: Secondary | ICD-10-CM

## 2017-01-01 DIAGNOSIS — O926 Galactorrhea: Secondary | ICD-10-CM | POA: Insufficient documentation

## 2017-01-01 DIAGNOSIS — N898 Other specified noninflammatory disorders of vagina: Secondary | ICD-10-CM | POA: Insufficient documentation

## 2017-01-01 DIAGNOSIS — N643 Galactorrhea not associated with childbirth: Secondary | ICD-10-CM

## 2017-01-01 DIAGNOSIS — Z0000 Encounter for general adult medical examination without abnormal findings: Secondary | ICD-10-CM | POA: Diagnosis not present

## 2017-01-01 DIAGNOSIS — J45909 Unspecified asthma, uncomplicated: Secondary | ICD-10-CM | POA: Insufficient documentation

## 2017-01-01 MED ORDER — FLUCONAZOLE 150 MG PO TABS: 150.0000 mg | ORAL_TABLET | Freq: Once | ORAL | 0 refills | Status: AC

## 2017-01-01 NOTE — Progress Notes (Signed)
Subjective:        Megan Chandler is a 41 y.o. female here for a routine exam.  Current complaints: Persistent nausea.  Denies abdominal pain, constipation or diarrhea.  Also has started producing breast milk, and has started having HA's and blurred vision.  Recently has had severe sweating episodes, but not hot flushes.  Personal health questionnaire:  Is patient Ashkenazi Jewish, have a family history of breast and/or ovarian cancer: no Is there a family history of uterine cancer diagnosed at age < 56, gastrointestinal cancer, urinary tract cancer, family member who is a Field seismologist syndrome-associated carrier: no Is the patient overweight and hypertensive, family history of diabetes, personal history of gestational diabetes, preeclampsia or PCOS: no Is patient over 60, have PCOS,  family history of premature CHD under age 26, diabetes, smoke, have hypertension or peripheral artery disease:  no At any time, has a partner hit, kicked or otherwise hurt or frightened you?: no Over the past 2 weeks, have you felt down, depressed or hopeless?: no Over the past 2 weeks, have you felt little interest or pleasure in doing things?:no   Gynecologic History Patient's last menstrual period was 05/01/2012. Contraception: status post hysterectomy Last Pap: unknown. Results were: unknown Last mammogram: none. Results were: none  Obstetric History OB History  Gravida Para Term Preterm AB Living  2 1 1  0 1 1  SAB TAB Ectopic Multiple Live Births  1 0 0 0 1    # Outcome Date GA Lbr Len/2nd Weight Sex Delivery Anes PTL Lv  2 SAB           1 Term               Past Medical History:  Diagnosis Date  . Adenomyosis   . Anemia   . Anxiety   . Arthritis   . Asthma    daily inhaler use  . Bartholin's cyst    hx of  . Bronchitis   . Chronic low back pain    buldging disc  . Endometriosis   . GERD (gastroesophageal reflux disease)   . Headache(784.0)    often but no migraines  . History  of bladder infections   . History of blood transfusion June 2013  . Hypertension    but not on medication  . Menometrorrhagia   . Pulmonary embolism ([place])    11/2011/ states no since  anticoagulation 1/14  . Shortness of breath    due to asthma  . Urinary tract infection     Past Surgical History:  Procedure Laterality Date  . ABDOMINAL HYSTERECTOMY    . BACK SURGERY     07/2011  . BILATERAL SALPINGECTOMY Bilateral 10/09/2012   Procedure: BILATERAL SALPINGECTOMY;  Surgeon: Lahoma Crocker, MD;  Location: [facility];  Service: Gynecology;  Laterality: Bilateral;  . CERVICAL SPINE SURGERY  2009  . CESAREAN SECTION  1996  . CHOLECYSTECTOMY, LAPAROSCOPIC  1998  . DILATION AND CURETTAGE OF UTERUS    . HYSTEROSCOPY  04/02/2012   Procedure: HYSTEROSCOPY;  Surgeon: Lahoma Crocker, MD;  Location: [facility];  Service: Gynecology;  Laterality: N/A;  with Dilitation and curretage and attempted hydrothermal ablation  . LAPAROSCOPIC TUBAL LIGATION  02/12/2012   Procedure: LAPAROSCOPIC TUBAL LIGATION;  Surgeon: Lahoma Crocker, MD;  Location: [facility];  Service: Gynecology;  Laterality: Bilateral;  . LAPAROSCOPY  at age 8  . LUMBAR SPINE SURGERY    . ROBOTIC ASSISTED LAP VAGINAL HYSTERECTOMY N/A 10/09/2012   Procedure: ROBOTIC  ASSISTED LAPAROSCOPIC VAGINAL HYSTERECTOMY;  Surgeon: Lahoma Crocker, MD;  Location: [facility];  Service: Gynecology;  Laterality: N/A;  . TUBAL LIGATION    . UMBILICAL HERNIA REPAIR     as a child     Current Outpatient Prescriptions:  .  albuterol (PROVENTIL HFA;VENTOLIN HFA) 108 (90 Base) MCG/ACT inhaler, Inhale 2 puffs into the lungs every 4 (four) hours as needed for wheezing or shortness of breath (cough)., Disp: 1 Inhaler, Rfl: 6 .  budesonide-formoterol (SYMBICORT) 80-4.5 MCG/ACT inhaler, Inhale 2 puffs into the lungs 2 (two) times daily., Disp: 1 Inhaler, Rfl: 5 .  fluconazole (DIFLUCAN) 150 MG tablet, Take 1 tablet (150 mg total) by mouth once., Disp: 1 tablet, Rfl:  0 .  pantoprazole (PROTONIX) 40 MG tablet, Take 1 tablet (40 mg total) by mouth daily. (Patient not taking: Reported on 01/01/2017), Disp: 30 tablet, Rfl: 0 Allergies  Allergen Reactions  . Morphine And Related Anaphylaxis    Pt can take dilaudid  . Aspirin Itching    Pt can tolerate ibuprofen  . Contrast Media [Iodinated Diagnostic Agents] Nausea And Vomiting  . Penicillins Itching  . Tramadol Itching  . Vicodin [Hydrocodone-Acetaminophen] Itching    Pt can take percocet    Social History  Substance Use Topics  . Smoking status: Former Smoker    Packs/day: 0.50    Types: Cigarettes, Cigars    Start date: 07/09/1995    Quit date: 11/20/2011  . Smokeless tobacco: Never Used     Comment: pt quit cigarettes in 2009, pt smoke 2 black and milds a day until 09-2011  . Alcohol use 0.0 oz/week     Comment: Occasionally, "maybe 1-2 times/month"    Family History  Problem Relation Age of Onset  . Hypertension Mother   . Cancer Maternal Aunt   . Cancer Maternal Uncle   . Diabetes Maternal Grandmother   . Stroke Other        aunt  . Sickle cell anemia Other   . Anesthesia problems Neg Hx   . Hypotension Neg Hx   . Malignant hyperthermia Neg Hx   . Pseudochol deficiency Neg Hx       Review of Systems  Constitutional: positive for fatigue and weight gain Respiratory: negative for cough and wheezing Cardiovascular: negative for chest pain, fatigue and palpitations Gastrointestinal: negative for abdominal pain and change in bowel habits Musculoskeletal:negative for myalgias Neurological: positive for HA's and blurred vision Behavioral/Psych: negative for abusive relationship, depression Endocrine: positive for excessive sweating Genitourinary:negative for abnormal menstrual periods, genital lesions, hot flashes, sexual problems and vaginal discharge Integument/breast: negative for breast lump, breast tenderness, nipple discharge and skin lesion(s)    Objective:       BP 125/86    Pulse 89   Ht 5\' 6"  (1.676 m)   Wt 151 lb 6.4 oz (68.7 kg)   LMP 05/01/2012   BMI 24.44 kg/m  General:   alert  Skin:   no rash or abnormalities  Lungs:   clear to auscultation bilaterally  Heart:   regular rate and rhythm, S1, S2 normal, no murmur, click, rub or gallop  Breasts:   normal without suspicious masses, skin or nipple changes or axillary nodes  Abdomen:  normal findings: no organomegaly, soft, non-tender and no hernia  Pelvis:  External genitalia: normal general appearance Urinary system: urethral meatus normal and bladder without fullness, nontender Vaginal: normal without tenderness, induration or masses Cervix: absent Adnexa: normal bimanual exam Uterus: absent   Lab Review Urine  pregnancy test Labs reviewed yes Radiologic studies reviewed yes  50% of 20 min visit spent on counseling and coordination of care.    Assessment:     1. Encounter for gynecological examination without abnormal finding Rx: - Hemoglobin A1c - TSH - CBC - Comprehensive metabolic panel  2. Galactorrhea in female Rx: - Prolactin  3. Nausea Rx: - Ambulatory referral to Gastroenterology  4. Chronic intractable headache, unspecified headache type Rx: - Prolactin  5. Diplopia Rx: - Prolactin  6. Vaginal discharge Rx: - Cervicovaginal ancillary only - fluconazole (DIFLUCAN) 150 MG tablet; Take 1 tablet (150 mg total) by mouth once.  Dispense: 1 tablet; Refill: 0  7. Hyperhidrosis Rx: [facility]   Plan:    Education reviewed: calcium supplements, depression evaluation, low fat, low cholesterol diet, self breast exams and weight bearing exercise. Follow up in: 2 weeks.   Meds ordered this encounter  Medications  . fluconazole (DIFLUCAN) 150 MG tablet    Sig: Take 1 tablet (150 mg total) by mouth once.    Dispense:  1 tablet    Refill:  0   Orders Placed This Encounter  Procedures  . Hemoglobin A1c  . Prolactin  . TSH  . CBC  . Comprehensive metabolic panel  .  [place]  . Ambulatory referral to Gastroenterology    Referral Priority:   Routine    Referral Type:   Consultation    Referral Reason:   Specialty Services Required    Number of Visits Requested:   1     Patient ID: Megan Chandler, female   DOB: 08/04/1975, 41 y.o.   MRN: [id number]   Patient ID: Megan Chandler, female   DOB: 27-Nov-1975, 41 y.o.   MRN: [id number]

## 2017-01-02 LAB — COMPREHENSIVE METABOLIC PANEL
ALBUMIN: 4.3 g/dL (ref 3.5–5.5)
ALT: 18 IU/L (ref 0–32)
AST: 18 IU/L (ref 0–40)
Albumin/Globulin Ratio: 1.7 (ref 1.2–2.2)
Alkaline Phosphatase: 81 IU/L (ref 39–117)
BUN/Creatinine Ratio: 18 (ref 9–23)
BUN: 13 mg/dL (ref 6–24)
Bilirubin Total: <0.2 mg/dL (ref 0.0–1.2)
CALCIUM: 9.5 mg/dL (ref 8.7–10.2)
CO2: 23 mmol/L (ref 20–29)
CREATININE: 0.71 mg/dL (ref 0.57–1.00)
Chloride: 100 mmol/L (ref 96–106)
GFR, EST AFRICAN AMERICAN: 121 mL/min/{1.73_m2} (ref >59)
GFR, EST NON AFRICAN AMERICAN: 105 mL/min/{1.73_m2} (ref >59)
GLOBULIN, TOTAL: 2.6 g/dL (ref 1.5–4.5)
Glucose: 72 mg/dL (ref 65–99)
Potassium: 4.3 mmol/L (ref 3.5–5.2)
SODIUM: 140 mmol/L (ref 134–144)
TOTAL PROTEIN: 6.9 g/dL (ref 6.0–8.5)

## 2017-01-02 LAB — CBC
HEMATOCRIT: 46.8 % — AB (ref 34.0–46.6)
HEMOGLOBIN: 14.6 g/dL (ref 11.1–15.9)
MCH: 31.5 pg (ref 26.6–33.0)
MCHC: 31.2 g/dL — ABNORMAL LOW (ref 31.5–35.7)
MCV: 101 fL — ABNORMAL HIGH (ref 79–97)
Platelets: 311 10*3/uL (ref 150–379)
RBC: 4.64 x10E6/uL (ref 3.77–5.28)
RDW: 13.6 % (ref 12.3–15.4)
WBC: 11.1 10*3/uL — AB (ref 3.4–10.8)

## 2017-01-02 LAB — CERVICOVAGINAL ANCILLARY ONLY
BACTERIAL VAGINITIS: POSITIVE — AB
Candida vaginitis: NEGATIVE
Trichomonas: NEGATIVE

## 2017-01-02 LAB — HEMOGLOBIN A1C
Est. average glucose Bld gHb Est-mCnc: 100 mg/dL
HEMOGLOBIN A1C: 5.1 % (ref 4.8–5.6)

## 2017-01-02 LAB — TSH: TSH: 1.19 u[IU]/mL (ref 0.450–4.500)

## 2017-01-02 LAB — PROLACTIN: Prolactin: 9.7 ng/mL (ref 4.8–23.3)

## 2017-01-02 LAB — FOLLICLE STIMULATING HORMONE: FSH: 65 m[IU]/mL

## 2017-01-03 ENCOUNTER — Other Ambulatory Visit: Payer: Self-pay | Admitting: Obstetrics

## 2017-01-03 DIAGNOSIS — N760 Acute vaginitis: Principal | ICD-10-CM

## 2017-01-03 DIAGNOSIS — B9689 Other specified bacterial agents as the cause of diseases classified elsewhere: Secondary | ICD-10-CM

## 2017-01-03 MED ORDER — METRONIDAZOLE 500 MG PO TABS: 500.0000 mg | ORAL_TABLET | Freq: Two times a day (BID) | ORAL | 2 refills | Status: DC

## 2017-01-07 ENCOUNTER — Encounter: Payer: Self-pay | Admitting: Internal Medicine

## 2017-01-15 ENCOUNTER — Ambulatory Visit (INDEPENDENT_AMBULATORY_CARE_PROVIDER_SITE_OTHER): Payer: Medicaid Other | Admitting: Obstetrics

## 2017-01-15 ENCOUNTER — Encounter: Payer: Self-pay | Admitting: Obstetrics

## 2017-01-15 VITALS — BP 114/75 | HR 89 | Ht 66.0 in | Wt 154.8 lb

## 2017-01-15 DIAGNOSIS — N951 Menopausal and female climacteric states: Secondary | ICD-10-CM

## 2017-01-15 DIAGNOSIS — R51 Headache: Secondary | ICD-10-CM

## 2017-01-15 DIAGNOSIS — R61 Generalized hyperhidrosis: Secondary | ICD-10-CM

## 2017-01-15 DIAGNOSIS — N643 Galactorrhea not associated with childbirth: Secondary | ICD-10-CM

## 2017-01-15 DIAGNOSIS — L74519 Primary focal hyperhidrosis, unspecified: Secondary | ICD-10-CM | POA: Diagnosis not present

## 2017-01-15 DIAGNOSIS — R399 Unspecified symptoms and signs involving the genitourinary system: Secondary | ICD-10-CM

## 2017-01-15 DIAGNOSIS — R519 Headache, unspecified: Secondary | ICD-10-CM

## 2017-01-15 NOTE — Progress Notes (Signed)
Patient ID: Megan Chandler, female   DOB: 09/06/1975, 41 y.o.   MRN: [id number]  Chief Complaint  Patient presents with  . GYN    HPI Megan Chandler is a 41 y.o. female.  History of headaches, galactorrhea, hot flashes and sweating.  Presents today for lab results. HPI  Past Medical History:  Diagnosis Date  . Adenomyosis   . Anemia   . Anxiety   . Arthritis   . Asthma    daily inhaler use  . Bartholin's cyst    hx of  . Bronchitis   . Chronic low back pain    buldging disc  . Endometriosis   . GERD (gastroesophageal reflux disease)   . Headache(784.0)    often but no migraines  . History of bladder infections   . History of blood transfusion June 2013  . Hypertension    but not on medication  . Menometrorrhagia   . Pulmonary embolism ([place])    11/2011/ states no since  anticoagulation 1/14  . Shortness of breath    due to asthma  . Urinary tract infection     Past Surgical History:  Procedure Laterality Date  . ABDOMINAL HYSTERECTOMY    . BACK SURGERY     07/2011  . BILATERAL SALPINGECTOMY Bilateral 10/09/2012   Procedure: BILATERAL SALPINGECTOMY;  Surgeon: Lahoma Crocker, MD;  Location: [facility];  Service: Gynecology;  Laterality: Bilateral;  . CERVICAL SPINE SURGERY  2009  . CESAREAN SECTION  1996  . CHOLECYSTECTOMY, LAPAROSCOPIC  1998  . DILATION AND CURETTAGE OF UTERUS    . HYSTEROSCOPY  04/02/2012   Procedure: HYSTEROSCOPY;  Surgeon: Lahoma Crocker, MD;  Location: [facility];  Service: Gynecology;  Laterality: N/A;  with Dilitation and curretage and attempted hydrothermal ablation  . LAPAROSCOPIC TUBAL LIGATION  02/12/2012   Procedure: LAPAROSCOPIC TUBAL LIGATION;  Surgeon: Lahoma Crocker, MD;  Location: [facility];  Service: Gynecology;  Laterality: Bilateral;  . LAPAROSCOPY  at age 41  . LUMBAR SPINE SURGERY    . ROBOTIC ASSISTED LAP VAGINAL HYSTERECTOMY N/A 10/09/2012   Procedure: ROBOTIC ASSISTED LAPAROSCOPIC VAGINAL HYSTERECTOMY;  Surgeon: Lahoma Crocker, MD;  Location: [facility];  Service: Gynecology;  Laterality: N/A;  . TUBAL LIGATION    . UMBILICAL HERNIA REPAIR     as a child    Family History  Problem Relation Age of Onset  . Hypertension Mother   . Cancer Maternal Aunt   . Cancer Maternal Uncle   . Diabetes Maternal Grandmother   . Stroke Other        aunt  . Sickle cell anemia Other   . Anesthesia problems Neg Hx   . Hypotension Neg Hx   . Malignant hyperthermia Neg Hx   . Pseudochol deficiency Neg Hx     Social History Social History  Substance Use Topics  . Smoking status: Former Smoker    Packs/day: 0.50    Types: Cigarettes, Cigars    Start date: 07/09/1995    Quit date: 11/20/2011  . Smokeless tobacco: Never Used     Comment: pt quit cigarettes in 2009, pt smoke 2 black and milds a day until 09-2011  . Alcohol use 0.0 oz/week     Comment: Occasionally, "maybe 1-2 times/month"    Allergies  Allergen Reactions  . Morphine And Related Anaphylaxis    Pt can take dilaudid  . Aspirin Itching    Pt can tolerate ibuprofen  . Contrast Media [Iodinated Diagnostic Agents] Nausea And Vomiting  .  Tramadol Itching  . Vicodin [Hydrocodone-Acetaminophen] Itching    Pt can take percocet    Current Outpatient Prescriptions  Medication Sig Dispense Refill  . albuterol (PROVENTIL HFA;VENTOLIN HFA) 108 (90 Base) MCG/ACT inhaler Inhale 2 puffs into the lungs every 4 (four) hours as needed for wheezing or shortness of breath (cough). 1 Inhaler 6  . budesonide-formoterol (SYMBICORT) 80-4.5 MCG/ACT inhaler Inhale 2 puffs into the lungs 2 (two) times daily. 1 Inhaler 5  . metroNIDAZOLE (FLAGYL) 500 MG tablet Take 1 tablet (500 mg total) by mouth 2 (two) times daily. (Patient not taking: Reported on 01/15/2017) 14 tablet 2  . pantoprazole (PROTONIX) 40 MG tablet Take 1 tablet (40 mg total) by mouth daily. (Patient not taking: Reported on 01/01/2017) 30 tablet 0   No current facility-administered medications for this  visit.     Review of Systems Review of Systems Constitutional: negative for fatigue and weight loss Respiratory: negative for cough and wheezing Cardiovascular: negative for chest pain, fatigue and palpitations Gastrointestinal: negative for abdominal pain and change in bowel habits Genitourinary:negative Integument/breast: positive for nipple discharge Musculoskeletal:negative for myalgias Neurological: positive for headaches Behavioral/Psych: negative for abusive relationship, depression Endocrine: positive for hot flashes and sweating   Blood pressure 114/75, pulse 89, height 5\' 6"  (1.676 m), weight 154 lb 12.8 oz (70.2 kg), last menstrual period 05/01/2012.  Physical Exam Physical Exam:  Deferred  >50% of 10 min visit spent on counseling and coordination of care.    Data Reviewed Labs: Results for Megan, Chandler (MRN [id number]) as of 01/15/2017 11:18  Ref. Range 01/01/2017 10:37  COMPREHENSIVE METABOLIC PANEL Unknown Rpt  Sodium Latest Ref Range: 134 - 144 mmol/L 140  Potassium Latest Ref Range: 3.5 - 5.2 mmol/L 4.3  Chloride Latest Ref Range: 96 - 106 mmol/L 100  CO2 Latest Ref Range: 20 - 29 mmol/L 23  Glucose Latest Ref Range: 65 - 99 mg/dL 72  BUN Latest Ref Range: 6 - 24 mg/dL 13  Creatinine Latest Ref Range: 0.57 - 1.00 mg/dL 0.71  Calcium Latest Ref Range: 8.7 - 10.2 mg/dL 9.5  BUN/Creatinine Ratio Latest Ref Range: 9 - 23  18  Alkaline Phosphatase Latest Ref Range: 39 - 117 IU/L 81  Albumin Latest Ref Range: 3.5 - 5.5 g/dL 4.3  Albumin/Globulin Ratio Latest Ref Range: 1.2 - 2.2  1.7  AST Latest Ref Range: 0 - 40 IU/L 18  ALT Latest Ref Range: 0 - 32 IU/L 18  Total Protein Latest Ref Range: 6.0 - 8.5 g/dL 6.9  Total Bilirubin Latest Ref Range: 0.0 - 1.2 mg/dL <0.2  GFR, Est African American Latest Ref Range: >59 mL/min/1.73 121  GFR, Est Non African American Latest Ref Range: >59 mL/min/1.73 105  Globulin, Total Latest Ref Range: 1.5 - 4.5 g/dL 2.6  WBC  Latest Ref Range: 3.4 - 10.8 x10E3/uL 11.1 (H)  RBC Latest Ref Range: 3.77 - 5.28 x10E6/uL 4.64  Hemoglobin Latest Ref Range: 11.1 - 15.9 g/dL 14.6  HCT Latest Ref Range: 34.0 - 46.6 % 46.8 (H)  MCV Latest Ref Range: 79 - 97 fL 101 (H)  MCH Latest Ref Range: 26.6 - 33.0 pg 31.5  MCHC Latest Ref Range: 31.5 - 35.7 g/dL 31.2 (L)  RDW Latest Ref Range: 12.3 - 15.4 % 13.6  Platelets Latest Ref Range: 150 - 379 x10E3/uL 311  FSH Latest Units: mIU/mL 65.0  Prolactin Latest Ref Range: 4.8 - 23.3 ng/mL 9.7  Hemoglobin A1C Latest Ref Range: 4.8 - 5.6 % 5.1  Est. average glucose Bld gHb Est-mCnc Latest Units: mg/dL 100  TSH Latest Ref Range: 0.450 - 4.500 uIU/mL 1.190   Assessment / Plan:    1. Chronic intractable headache, unspecified headache type Rx: - MR BRAIN W WO CONTRAST; Future - Ambulatory referral to Ophthalmology  2. Galactorrhea in female Rx: - MR BRAIN W Agar; Future  3. Hyperhidrosis Rx: - MR BRAIN W WO CONTRAST; Future  4. Menopausal symptoms Rx: - MR BRAIN W WO CONTRAST; Future  5. UTI symptoms Rx: - Urine Culture        Orders Placed This Encounter  Procedures  . Urine Culture  . MR BRAIN W WO CONTRAST    Standing Status:   Future    Standing Expiration Date:   03/18/2018    Order Specific Question:   If indicated for the ordered procedure, I authorize the administration of contrast media per Radiology protocol    Answer:   Yes    Order Specific Question:   Reason for Exam (SYMPTOM  OR DIAGNOSIS REQUIRED)    Answer:   Headaches.  Galactorrhea.  Premature Menopause.    Order Specific Question:   What is the patient's sedation requirement?    Answer:   Oral Sedation    Order Specific Question:   Does the patient have a pacemaker or implanted devices?    Answer:   Yes    Order Specific Question:   Preferred imaging location?    Answer:   [facility] (table limit-550lbs)    Order Specific Question:   Radiology Contrast Protocol - do NOT remove  file path    Answer:   [email or web address]  . Ambulatory referral to Ophthalmology    Referral Priority:   Routine    Referral Type:   Consultation    Referral Reason:   Specialty Services Required    Requested Specialty:   Ophthalmology    Number of Visits Requested:   1     Follow up in 2 weeks.

## 2017-01-15 NOTE — Progress Notes (Signed)
Patient is in the office to follow up with results.

## 2017-01-17 LAB — URINE CULTURE

## 2017-01-22 ENCOUNTER — Telehealth: Payer: Self-pay | Admitting: *Deleted

## 2017-01-22 NOTE — Telephone Encounter (Signed)
Patient is requesting yeast medication- she is having symptoms and would like treatment sent to her pharmacy.

## 2017-01-24 ENCOUNTER — Other Ambulatory Visit: Payer: Self-pay | Admitting: Obstetrics

## 2017-01-24 DIAGNOSIS — B3731 Acute candidiasis of vulva and vagina: Secondary | ICD-10-CM

## 2017-01-24 DIAGNOSIS — B373 Candidiasis of vulva and vagina: Secondary | ICD-10-CM

## 2017-01-24 MED ORDER — FLUCONAZOLE 150 MG PO TABS: 150.0000 mg | ORAL_TABLET | Freq: Once | ORAL | 0 refills | Status: AC

## 2017-01-24 NOTE — Telephone Encounter (Signed)
Diflucan Rx 

## 2017-01-30 ENCOUNTER — Ambulatory Visit: Payer: Self-pay | Admitting: Obstetrics

## 2017-03-06 ENCOUNTER — Encounter (HOSPITAL_COMMUNITY): Payer: Self-pay | Admitting: Emergency Medicine

## 2017-03-06 ENCOUNTER — Ambulatory Visit (INDEPENDENT_AMBULATORY_CARE_PROVIDER_SITE_OTHER): Payer: Medicaid Other

## 2017-03-06 ENCOUNTER — Ambulatory Visit (HOSPITAL_COMMUNITY)
Admission: EM | Admit: 2017-03-06 | Discharge: 2017-03-06 | Disposition: A | Discharge status: Home / Self Care | Payer: Medicaid Other | Attending: Family Medicine | Admitting: Family Medicine

## 2017-03-06 DIAGNOSIS — S93492A Sprain of other ligament of left ankle, initial encounter: Secondary | ICD-10-CM

## 2017-03-06 DIAGNOSIS — M25572 Pain in left ankle and joints of left foot: Secondary | ICD-10-CM | POA: Diagnosis not present

## 2017-03-06 DIAGNOSIS — S99912A Unspecified injury of left ankle, initial encounter: Secondary | ICD-10-CM

## 2017-03-06 DIAGNOSIS — S80212A Abrasion, left knee, initial encounter: Secondary | ICD-10-CM

## 2017-03-06 DIAGNOSIS — S80211A Abrasion, right knee, initial encounter: Secondary | ICD-10-CM | POA: Diagnosis not present

## 2017-03-06 DIAGNOSIS — W19XXXA Unspecified fall, initial encounter: Secondary | ICD-10-CM

## 2017-03-06 MED ORDER — DICLOFENAC SODIUM 75 MG PO TBEC: 75.0000 mg | DELAYED_RELEASE_TABLET | Freq: Two times a day (BID) | ORAL | 0 refills | Status: DC

## 2017-03-06 MED ORDER — IBUPROFEN 800 MG PO TABS
ORAL_TABLET | ORAL | Status: AC
  Filled 2017-03-06: qty 1

## 2017-03-06 MED ORDER — IBUPROFEN 800 MG PO TABS
800.0000 mg | ORAL_TABLET | Freq: Once | ORAL | Status: AC
  Administered 2017-03-06: 800 mg via ORAL

## 2017-03-06 NOTE — ED Triage Notes (Signed)
Pt states she was getting out of a car last night when she slipped and fell on both knees causing some abrasions and pain in the knees.  Pt also complains of left ankle pain, with no ability to walk on it.

## 2017-03-06 NOTE — Discharge Instructions (Signed)
Follow up with your regular doctor or an orthopaedist if your ankle is not improving within the next week.

## 2017-03-06 NOTE — ED Provider Notes (Signed)
[facility]   [id number] 03/06/17 Arrival Time: [place]:  1. Abrasion of knee, bilateral   2. Ankle injury, left, initial encounter     Meds ordered this encounter  Medications  . ibuprofen (ADVIL,MOTRIN) tablet 800 mg  . diclofenac (VOLTAREN) 75 MG EC tablet    Sig: Take 1 tablet (75 mg total) by mouth 2 (two) times daily.    Dispense:  14 tablet    Refill:  0   No fracture seen. Abrasions cleaned and bandaged. Given walking boot to assist with ambulation. Will f/u with PCP if not showing improvement within the next several days.  Reviewed expectations re: course of current medical issues. Questions answered. Outlined signs and symptoms indicating need for more acute intervention. Patient verbalized understanding. After Visit Summary given.   SUBJECTIVE:  Megan Chandler is a 41 y.o. female who presents with complaint of left ankle pain and swelling after falling last evening. Getting out of car and slipped. Abrasions to both knees. Left ankle immediately painful with swelling noticed this morning. Reports that she has been unable to bear weight on LLE secondary to ankle pain. No OTC analgesics taken. No previous ankle injury reported. No extremity sensation changes.  ROS: As per HPI.   OBJECTIVE:  Vitals:   03/06/17 1557  BP: 110/74  Pulse: 85  Temp: 98.3 F (36.8 C)  TempSrc: Oral  SpO2: 97%    General appearance: alert; no distress Extremities: no cyanosis or edema; bilateral knees with anterior abrasions; no active bleeding; L ankle with lateral swelling with poorly localized tenderness; reports unable to bear weight Skin: warm and dry Neurologic: normal gait; normal symmetric reflexes; distal extremity sensation intact Psychological: alert and cooperative; normal mood and affect  Imaging: Dg Ankle Complete Left  Result Date: 03/06/2017 CLINICAL DATA:  Left ankle pain following a fall last night. EXAM: LEFT ANKLE COMPLETE - 3+  VIEW COMPARISON:  Left lower leg dated 09/23/2012. FINDINGS: Mild diffuse soft tissue swelling. No fracture, dislocation or effusion. IMPRESSION: No fracture. Electronically Signed   By: Claudie Revering M.D.   On: 03/06/2017 16:30    Allergies  Allergen Reactions  . Morphine And Related Anaphylaxis    Pt can take dilaudid  . Aspirin Itching    Pt can tolerate ibuprofen  . Contrast Media [Iodinated Diagnostic Agents] Nausea And Vomiting  . Tramadol Itching  . Vicodin [Hydrocodone-Acetaminophen] Itching    Pt can take percocet    Past Medical History:  Diagnosis Date  . Adenomyosis   . Anemia   . Anxiety   . Arthritis   . Asthma    daily inhaler use  . Bartholin's cyst    hx of  . Bronchitis   . Chronic low back pain    buldging disc  . Endometriosis   . GERD (gastroesophageal reflux disease)   . Headache(784.0)    often but no migraines  . History of bladder infections   . History of blood transfusion June 2013  . Hypertension    but not on medication  . Menometrorrhagia   . Pulmonary embolism ([place])    11/2011/ states no since  anticoagulation 1/14  . Shortness of breath    due to asthma  . Urinary tract infection    Social History   Social History  . Marital status: Single    Spouse name: N/A  . Number of children: N/A  . Years of education: N/A   Occupational History  . disabled  Social History Main Topics  . Smoking status: Former Smoker    Packs/day: 0.50    Types: Cigarettes, Cigars    Start date: 07/09/1995    Quit date: 11/20/2011  . Smokeless tobacco: Never Used     Comment: pt quit cigarettes in 2009, pt smoke 2 black and milds a day until 09-2011  . Alcohol use 0.0 oz/week     Comment: Occasionally, "maybe 1-2 times/month"  . Drug use: Yes    Frequency: 2.0 times per week    Types: Marijuana  . Sexual activity: Yes    Partners: Male    Birth control/ protection: None   Other Topics Concern  . Not on file   Social History Narrative  . No  narrative on file   Family History  Problem Relation Age of Onset  . Hypertension Mother   . Cancer Maternal Aunt   . Cancer Maternal Uncle   . Diabetes Maternal Grandmother   . Stroke Other        aunt  . Sickle cell anemia Other   . Anesthesia problems Neg Hx   . Hypotension Neg Hx   . Malignant hyperthermia Neg Hx   . Pseudochol deficiency Neg Hx    Past Surgical History:  Procedure Laterality Date  . ABDOMINAL HYSTERECTOMY    . BACK SURGERY     07/2011  . BILATERAL SALPINGECTOMY Bilateral 10/09/2012   Procedure: BILATERAL SALPINGECTOMY;  Surgeon: Lahoma Crocker, MD;  Location: [facility];  Service: Gynecology;  Laterality: Bilateral;  . CERVICAL SPINE SURGERY  2009  . CESAREAN SECTION  1996  . CHOLECYSTECTOMY, LAPAROSCOPIC  1998  . DILATION AND CURETTAGE OF UTERUS    . HYSTEROSCOPY  04/02/2012   Procedure: HYSTEROSCOPY;  Surgeon: Lahoma Crocker, MD;  Location: [facility];  Service: Gynecology;  Laterality: N/A;  with Dilitation and curretage and attempted hydrothermal ablation  . LAPAROSCOPIC TUBAL LIGATION  02/12/2012   Procedure: LAPAROSCOPIC TUBAL LIGATION;  Surgeon: Lahoma Crocker, MD;  Location: [facility];  Service: Gynecology;  Laterality: Bilateral;  . LAPAROSCOPY  at age 66  . LUMBAR SPINE SURGERY    . ROBOTIC ASSISTED LAP VAGINAL HYSTERECTOMY N/A 10/09/2012   Procedure: ROBOTIC ASSISTED LAPAROSCOPIC VAGINAL HYSTERECTOMY;  Surgeon: Lahoma Crocker, MD;  Location: [facility];  Service: Gynecology;  Laterality: N/A;  . TUBAL LIGATION    . UMBILICAL HERNIA REPAIR     as a child     Vanessa Kick, MD 03/08/17 1059

## 2017-03-14 ENCOUNTER — Telehealth: Payer: Self-pay

## 2017-03-14 DIAGNOSIS — B379 Candidiasis, unspecified: Secondary | ICD-10-CM

## 2017-03-14 MED ORDER — FLUCONAZOLE 150 MG PO TABS: 150.0000 mg | ORAL_TABLET | Freq: Once | ORAL | 0 refills | Status: AC

## 2017-03-14 NOTE — Telephone Encounter (Signed)
Pt complains of having vaginal itching, with thick white discharge. She would like diflucan rx sent to the pharmacy. Per protocol rx sent for diflucan.

## 2017-04-14 ENCOUNTER — Telehealth: Payer: Self-pay

## 2017-04-14 NOTE — Telephone Encounter (Signed)
Pt called requesting rx for diflucan. Pt was last treated 03/14/17 and

## 2017-04-15 ENCOUNTER — Other Ambulatory Visit: Payer: Self-pay

## 2017-04-15 DIAGNOSIS — B379 Candidiasis, unspecified: Secondary | ICD-10-CM

## 2017-04-15 MED ORDER — FLUCONAZOLE 150 MG PO TABS: 150.0000 mg | ORAL_TABLET | Freq: Once | ORAL | 0 refills | Status: AC

## 2017-04-21 ENCOUNTER — Encounter: Payer: Self-pay | Admitting: Internal Medicine

## 2017-04-21 NOTE — Progress Notes (Deleted)
Flu vaccine  GAD    PMH provoked pulmonary embolism, GERD, asthma, anxiety   Anxiety

## 2017-04-22 ENCOUNTER — Encounter: Payer: Self-pay | Admitting: Internal Medicine

## 2017-04-24 ENCOUNTER — Encounter: Payer: Self-pay | Admitting: Internal Medicine

## 2017-05-31 ENCOUNTER — Emergency Department (HOSPITAL_COMMUNITY): Payer: Medicaid Other

## 2017-05-31 ENCOUNTER — Other Ambulatory Visit: Payer: Self-pay

## 2017-05-31 ENCOUNTER — Encounter (HOSPITAL_COMMUNITY): Payer: Self-pay | Admitting: Emergency Medicine

## 2017-05-31 DIAGNOSIS — Z885 Allergy status to narcotic agent status: Secondary | ICD-10-CM | POA: Insufficient documentation

## 2017-05-31 DIAGNOSIS — R0789 Other chest pain: Secondary | ICD-10-CM | POA: Insufficient documentation

## 2017-05-31 DIAGNOSIS — J069 Acute upper respiratory infection, unspecified: Secondary | ICD-10-CM | POA: Diagnosis not present

## 2017-05-31 DIAGNOSIS — R0602 Shortness of breath: Secondary | ICD-10-CM | POA: Insufficient documentation

## 2017-05-31 DIAGNOSIS — Z87891 Personal history of nicotine dependence: Secondary | ICD-10-CM | POA: Insufficient documentation

## 2017-05-31 DIAGNOSIS — Z79899 Other long term (current) drug therapy: Secondary | ICD-10-CM | POA: Insufficient documentation

## 2017-05-31 DIAGNOSIS — Z86711 Personal history of pulmonary embolism: Secondary | ICD-10-CM | POA: Insufficient documentation

## 2017-05-31 DIAGNOSIS — J45909 Unspecified asthma, uncomplicated: Secondary | ICD-10-CM | POA: Insufficient documentation

## 2017-05-31 DIAGNOSIS — R05 Cough: Secondary | ICD-10-CM | POA: Diagnosis present

## 2017-05-31 LAB — BASIC METABOLIC PANEL
ANION GAP: 8 (ref 5–15)
BUN: 10 mg/dL (ref 6–20)
CALCIUM: 9.2 mg/dL (ref 8.9–10.3)
CHLORIDE: 103 mmol/L (ref 101–111)
CO2: 26 mmol/L (ref 22–32)
Creatinine, Ser: 0.71 mg/dL (ref 0.44–1.00)
GFR calc non Af Amer: >60 mL/min (ref >60)
GLUCOSE: 81 mg/dL (ref 65–99)
Potassium: 4 mmol/L (ref 3.5–5.1)
Sodium: 137 mmol/L (ref 135–145)

## 2017-05-31 LAB — I-STAT TROPONIN, ED: TROPONIN I, POC: 0 ng/mL (ref 0.00–0.08)

## 2017-05-31 LAB — CBC
HCT: 42.8 % (ref 36.0–46.0)
HEMOGLOBIN: 14.6 g/dL (ref 12.0–15.0)
MCH: 32.2 pg (ref 26.0–34.0)
MCHC: 34.1 g/dL (ref 30.0–36.0)
MCV: 94.5 fL (ref 78.0–100.0)
Platelets: 285 10*3/uL (ref 150–400)
RBC: 4.53 MIL/uL (ref 3.87–5.11)
RDW: 13.5 % (ref 11.5–15.5)
WBC: 11.5 10*3/uL — ABNORMAL HIGH (ref 4.0–10.5)

## 2017-05-31 NOTE — ED Triage Notes (Signed)
Pt c/o dry cough x 2 days R rib pain onset yesterday after excessive coughing. Exacerbation of chronic low back pain ( pain clinic) L leg feels "tight" onset today(pt would like to be checked for cancer) R sided CP began OTW to ED, pt states she has hx of blood clots Pt reports for lazy 2 days having episodes of hot flashes with diaphoresis, pt states these episodes usually exacerbate coughing.

## 2017-06-01 ENCOUNTER — Encounter (HOSPITAL_COMMUNITY): Payer: Self-pay | Admitting: Emergency Medicine

## 2017-06-01 ENCOUNTER — Emergency Department (HOSPITAL_COMMUNITY)
Admission: EM | Admit: 2017-06-01 | Discharge: 2017-06-01 | Disposition: A | Discharge status: Home / Self Care | Payer: Medicaid Other | Source: Home / Self Care | Attending: Emergency Medicine | Admitting: Emergency Medicine

## 2017-06-01 ENCOUNTER — Emergency Department (HOSPITAL_COMMUNITY)
Admission: EM | Admit: 2017-06-01 | Discharge: 2017-06-01 | Disposition: A | Discharge status: Home / Self Care | Payer: Medicaid Other | Attending: Emergency Medicine | Admitting: Emergency Medicine

## 2017-06-01 DIAGNOSIS — J069 Acute upper respiratory infection, unspecified: Secondary | ICD-10-CM

## 2017-06-01 DIAGNOSIS — R0789 Other chest pain: Secondary | ICD-10-CM

## 2017-06-01 DIAGNOSIS — B9789 Other viral agents as the cause of diseases classified elsewhere: Secondary | ICD-10-CM

## 2017-06-01 HISTORY — DX: Sciatica, unspecified side: M54.30

## 2017-06-01 LAB — CBC WITH DIFFERENTIAL/PLATELET
BASOS PCT: 0 %
Basophils Absolute: 0 10*3/uL (ref 0.0–0.1)
EOS ABS: 0.4 10*3/uL (ref 0.0–0.7)
EOS PCT: 4 %
HEMATOCRIT: 45.3 % (ref 36.0–46.0)
HEMOGLOBIN: 15.6 g/dL — AB (ref 12.0–15.0)
LYMPHS PCT: 42 %
Lymphs Abs: 4.4 10*3/uL — ABNORMAL HIGH (ref 0.7–4.0)
MCH: 32.6 pg (ref 26.0–34.0)
MCHC: 34.4 g/dL (ref 30.0–36.0)
MCV: 94.6 fL (ref 78.0–100.0)
MONOS PCT: 9 %
Monocytes Absolute: 0.9 10*3/uL (ref 0.1–1.0)
NEUTROS ABS: 4.8 10*3/uL (ref 1.7–7.7)
NEUTROS PCT: 45 %
Platelets: 263 10*3/uL (ref 150–400)
RBC: 4.79 MIL/uL (ref 3.87–5.11)
RDW: 13.6 % (ref 11.5–15.5)
WBC: 10.5 10*3/uL (ref 4.0–10.5)

## 2017-06-01 LAB — I-STAT BETA HCG BLOOD, ED (MC, WL, AP ONLY)

## 2017-06-01 LAB — COMPREHENSIVE METABOLIC PANEL
ALK PHOS: 85 U/L (ref 38–126)
ALT: 44 U/L (ref 14–54)
ANION GAP: 8 (ref 5–15)
AST: 81 U/L — ABNORMAL HIGH (ref 15–41)
Albumin: 3.9 g/dL (ref 3.5–5.0)
BILIRUBIN TOTAL: 0.4 mg/dL (ref 0.3–1.2)
BUN: 8 mg/dL (ref 6–20)
CALCIUM: 9.3 mg/dL (ref 8.9–10.3)
CO2: 25 mmol/L (ref 22–32)
Chloride: 105 mmol/L (ref 101–111)
Creatinine, Ser: 0.73 mg/dL (ref 0.44–1.00)
GFR calc non Af Amer: >60 mL/min (ref >60)
GLUCOSE: 106 mg/dL — AB (ref 65–99)
Potassium: 3.4 mmol/L — ABNORMAL LOW (ref 3.5–5.1)
SODIUM: 138 mmol/L (ref 135–145)
Total Protein: 6.9 g/dL (ref 6.5–8.1)

## 2017-06-01 LAB — I-STAT TROPONIN, ED: Troponin i, poc: 0.01 ng/mL (ref 0.00–0.08)

## 2017-06-01 LAB — D-DIMER, QUANTITATIVE: D-Dimer, Quant: 0.38 ug/mL-FEU (ref 0.00–0.50)

## 2017-06-01 MED ORDER — LIDOCAINE 5 % EX PTCH
1.0000 | MEDICATED_PATCH | CUTANEOUS | Status: DC
  Administered 2017-06-01: 1 via TRANSDERMAL
  Filled 2017-06-01: qty 1

## 2017-06-01 MED ORDER — GUAIFENESIN-CODEINE 100-10 MG/5ML PO SOLN: 10.0000 mL | Freq: Four times a day (QID) | ORAL | 0 refills | Status: DC | PRN

## 2017-06-01 NOTE — Discharge Instructions (Signed)
Robitussin with codeine as prescribed as needed for cough.  Ibuprofen 600 mg every 6 hours as needed for pain or fever.  Return to the emergency department if you develop difficulty breathing, worsening chest pain, or other new and concerning symptoms.

## 2017-06-01 NOTE — ED Notes (Signed)
Pt verbalized understanding of dc instructions, vss, nad. Ambulatory upon discharge with significant other

## 2017-06-01 NOTE — ED Notes (Signed)
Pt removed IV that was started by ED staff.

## 2017-06-01 NOTE — ED Provider Notes (Signed)
[facility] DEPART[facility] Provider Note   CSN: [id number] Arrival date & time: 05/31/17  2153     History   Chief Complaint Chief Complaint  Patient presents with  . Cough    HPI Megan Chandler is a 41 y.o. female.  Patient is a 42 year old female with history of anxiety, asthma, and prior pulmonary embolism.  She presents today for evaluation of cough and chest congestion for the past 2 days.  She denies any fevers or chills.  She reports some discomfort in her chest when coughing.  She denies any ill contacts.  She has tried her inhaler with minimal relief.   The history is provided by the patient.  Cough  This is a new problem. The problem occurs constantly. The problem has been gradually worsening. The cough is non-productive. There has been no fever. Associated symptoms include chest pain. Pertinent negatives include no chills, no shortness of breath and no wheezing. She is not a smoker.    Past Medical History:  Diagnosis Date  . Adenomyosis   . Anemia   . Anxiety   . Asthma    daily inhaler use  . Bartholin's cyst    hx of  . Bronchitis   . Endometriosis   . GERD (gastroesophageal reflux disease)   . Menometrorrhagia   . Pulmonary embolism ([place])    11/2011/ states no since  anticoagulation 1/14  . Sciatica     Patient Active Problem List   Diagnosis Date Noted  . Frequent falls 12/09/2016  . Grief 03/14/2016  . Left lumbosacral radiculopathy 01/02/2015  . GERD without esophagitis 02/26/2013  . Shortness of breath 04/23/2012  . Anxiety 11/20/2011  . Endometriosis 11/20/2011  . Asthma 11/20/2011    Past Surgical History:  Procedure Laterality Date  . ABDOMINAL HYSTERECTOMY    . BACK SURGERY     07/2011  . BILATERAL SALPINGECTOMY Bilateral 10/09/2012   Procedure: BILATERAL SALPINGECTOMY;  Surgeon: Lahoma Crocker, MD;  Location: [facility];  Service: Gynecology;  Laterality: Bilateral;  . CERVICAL SPINE SURGERY  2009  .  CESAREAN SECTION  1996  . CHOLECYSTECTOMY, LAPAROSCOPIC  1998  . DILATION AND CURETTAGE OF UTERUS    . HYSTEROSCOPY  04/02/2012   Procedure: HYSTEROSCOPY;  Surgeon: Lahoma Crocker, MD;  Location: [facility];  Service: Gynecology;  Laterality: N/A;  with Dilitation and curretage and attempted hydrothermal ablation  . LAPAROSCOPIC TUBAL LIGATION  02/12/2012   Procedure: LAPAROSCOPIC TUBAL LIGATION;  Surgeon: Lahoma Crocker, MD;  Location: [facility];  Service: Gynecology;  Laterality: Bilateral;  . LAPAROSCOPY  at age 77  . LUMBAR SPINE SURGERY    . ROBOTIC ASSISTED LAP VAGINAL HYSTERECTOMY N/A 10/09/2012   Procedure: ROBOTIC ASSISTED LAPAROSCOPIC VAGINAL HYSTERECTOMY;  Surgeon: Lahoma Crocker, MD;  Location: [facility];  Service: Gynecology;  Laterality: N/A;  . TUBAL LIGATION    . UMBILICAL HERNIA REPAIR     as a child    OB History    Gravida Para Term Preterm AB Living   2 1 1  0 1 1   SAB TAB Ectopic Multiple Live Births   1 0 0 0 1       Home Medications    Prior to Admission medications   Medication Sig Start Date End Date Taking? Authorizing Provider  albuterol (PROVENTIL HFA;VENTOLIN HFA) 108 (90 Base) MCG/ACT inhaler Inhale 2 puffs into the lungs every 4 (four) hours as needed for wheezing or shortness of breath (cough). 12/04/16   Maryellen Pile,  MD  budesonide-formoterol (SYMBICORT) 80-4.5 MCG/ACT inhaler Inhale 2 puffs into the lungs 2 (two) times daily. 12/04/16 01/13/18  Maryellen Pile, MD  diclofenac (VOLTAREN) 75 MG EC tablet Take 1 tablet (75 mg total) by mouth 2 (two) times daily. 03/06/17   Vanessa Kick, MD  metroNIDAZOLE (FLAGYL) 500 MG tablet Take 1 tablet (500 mg total) by mouth 2 (two) times daily. Patient not taking: Reported on 01/15/2017 01/03/17   Shelly Bombard, MD  pantoprazole (PROTONIX) 40 MG tablet Take 1 tablet (40 mg total) by mouth daily. Patient not taking: Reported on 01/01/2017 07/29/16 07/29/17  Shela Leff, MD    Family History Family History   Problem Relation Age of Onset  . Hypertension Mother   . Cancer Maternal Aunt   . Cancer Maternal Uncle   . Diabetes Maternal Grandmother   . Stroke Other        aunt  . Sickle cell anemia Other   . Anesthesia problems Neg Hx   . Hypotension Neg Hx   . Malignant hyperthermia Neg Hx   . Pseudochol deficiency Neg Hx     Social History Social History   Tobacco Use  . Smoking status: Former Smoker    Packs/day: 0.50    Types: Cigarettes, Cigars    Start date: 07/09/1995    Last attempt to quit: 11/20/2011    Years since quitting: 5.5  . Smokeless tobacco: Never Used  . Tobacco comment: pt quit cigarettes in 2009, pt smoke 2 black and milds a day until 09-2011  Substance Use Topics  . Alcohol use: Yes    Alcohol/week: 0.0 oz    Comment: Occasionally, "maybe 1-2 times/month"  . Drug use: Yes    Frequency: 2.0 times per week    Types: Marijuana     Allergies   Morphine and related; Aspirin; Contrast media [iodinated diagnostic agents]; Tramadol; and Vicodin [hydrocodone-acetaminophen]   Review of Systems Review of Systems  Constitutional: Negative for chills.  Respiratory: Positive for cough. Negative for shortness of breath and wheezing.   Cardiovascular: Positive for chest pain.  All other systems reviewed and are negative.    Physical Exam Updated Vital Signs BP (!) 116/99 (BP Location: Right Arm)   Pulse 81   Temp 98.4 F (36.9 C) (Oral)   Resp 18   LMP 05/01/2012   SpO2 100%   Physical Exam  Constitutional: She is oriented to person, place, and time. She appears well-developed and well-nourished. No distress.  HENT:  Head: Normocephalic and atraumatic.  Neck: Normal range of motion. Neck supple.  Cardiovascular: Normal rate and regular rhythm. Exam reveals no gallop and no friction rub.  No murmur heard. Pulmonary/Chest: Effort normal and breath sounds normal. No respiratory distress. She has no wheezes.  Abdominal: Soft. Bowel sounds are normal. She  exhibits no distension. There is no tenderness.  Musculoskeletal: Normal range of motion. She exhibits no edema.  Neurological: She is alert and oriented to person, place, and time.  Skin: Skin is warm and dry. She is not diaphoretic.  Nursing note and vitals reviewed.    ED Treatments / Results  Labs (all labs ordered are listed, but only abnormal results are displayed) Labs Reviewed  CBC - Abnormal; Notable for the following components:      Result Value   WBC 11.5 (*)    All other components within normal limits  BASIC METABOLIC PANEL  I-STAT TROPONIN, ED    EKG  EKG Interpretation None  Radiology Dg Chest 2 View  Result Date: 05/31/2017 CLINICAL DATA:  Right rib pain for 2 days. Cough and chest pain for 3 days. Smoker. EXAM: CHEST  2 VIEW COMPARISON:  11/08/2014 FINDINGS: Normal heart size and pulmonary vascularity. No focal airspace disease or consolidation in the lungs. No blunting of costophrenic angles. No pneumothorax. Mediastinal contours appear intact. Postoperative changes in the cervical and lumbar spine. Surgical clips in the right upper quadrant. IMPRESSION: No active cardiopulmonary disease. Electronically Signed   By: Lucienne Capers M.D.   On: 05/31/2017 22:42    Procedures Procedures (including critical care time)  Medications Ordered in ED Medications - No data to display   Initial Impression / Assessment and Plan / ED Course  I have reviewed the triage vital signs and the nursing notes.  Pertinent labs & imaging results that were available during my care of the patient were reviewed by me and considered in my medical decision making (see chart for details).  Patient presents with cough and right-sided chest discomfort for the past 2 days.  Symptoms sound like a upper respiratory infection.  She has a history of pulmonary embolism, however there is no tachycardia or hypoxia.  Her symptoms sound infectious in nature.  Chest x-ray is clear.  She  will be treated with Robitussin with codeine, ibuprofen, and follow-up as needed.  Final Clinical Impressions(s) / ED Diagnoses   Final diagnoses:  None    ED Discharge Orders    None       Veryl Speak, MD 06/01/17 [id number]

## 2017-06-01 NOTE — ED Provider Notes (Signed)
[place] EMERGENCY DEPARTMENT Provider Note   CSN: [id number] Arrival date & time: 06/01/17  2774     History   Chief Complaint Chief Complaint  Patient presents with  . Chest Pain    HPI   Blood pressure (!) 150/94, pulse 69, temperature (!) 97.4 F (36.3 C), temperature source Oral, resp. rate 16, last menstrual period 05/01/2012, SpO2 100 %.  Megan Chandler is a 41 y.o. female past medical history significant for asthma, anxiety, PE (not anticoagulated) complaining of right-sided chest pain onset 3 days ago with associated shortness of breath.  She was seen and evaluated in the ED early this a.m., she was given codeine for pain control at discharge but she feels that the codeine has made the pain worse and spread more superiorly and caused a shortness of breath which she did not have before She rates her pain at 10 out of 10, states it is worse lying supine.  No palpitations, syncope, calf pain or leg swelling, recent MI or hemoptysis, fever, chills.  She endorses productive cough.  Denies diabetes, hypertension hyperlipidemia.  She states that she does not have a primary care physician but is checked regularly by her OB/GYN.  She does have family history of ACS.  Denies cocaine or methamphetamine use.   Past Medical History:  Diagnosis Date  . Adenomyosis   . Anemia   . Anxiety   . Asthma    daily inhaler use  . Bartholin's cyst    hx of  . Bronchitis   . Endometriosis   . GERD (gastroesophageal reflux disease)   . Menometrorrhagia   . Pulmonary embolism ([place])    11/2011/ states no since  anticoagulation 1/14  . Sciatica     Patient Active Problem List   Diagnosis Date Noted  . Frequent falls 12/09/2016  . Grief 03/14/2016  . Left lumbosacral radiculopathy 01/02/2015  . GERD without esophagitis 02/26/2013  . Shortness of breath 04/23/2012  . Anxiety 11/20/2011  . Endometriosis 11/20/2011  . Asthma 11/20/2011    Past Surgical History:    Procedure Laterality Date  . ABDOMINAL HYSTERECTOMY    . BACK SURGERY     07/2011  . BILATERAL SALPINGECTOMY Bilateral 10/09/2012   Procedure: BILATERAL SALPINGECTOMY;  Surgeon: Lahoma Crocker, MD;  Location: [facility];  Service: Gynecology;  Laterality: Bilateral;  . CERVICAL SPINE SURGERY  2009  . CESAREAN SECTION  1996  . CHOLECYSTECTOMY, LAPAROSCOPIC  1998  . DILATION AND CURETTAGE OF UTERUS    . HYSTEROSCOPY  04/02/2012   Procedure: HYSTEROSCOPY;  Surgeon: Lahoma Crocker, MD;  Location: [facility];  Service: Gynecology;  Laterality: N/A;  with Dilitation and curretage and attempted hydrothermal ablation  . LAPAROSCOPIC TUBAL LIGATION  02/12/2012   Procedure: LAPAROSCOPIC TUBAL LIGATION;  Surgeon: Lahoma Crocker, MD;  Location: [facility];  Service: Gynecology;  Laterality: Bilateral;  . LAPAROSCOPY  at age 11  . LUMBAR SPINE SURGERY    . ROBOTIC ASSISTED LAP VAGINAL HYSTERECTOMY N/A 10/09/2012   Procedure: ROBOTIC ASSISTED LAPAROSCOPIC VAGINAL HYSTERECTOMY;  Surgeon: Lahoma Crocker, MD;  Location: [facility];  Service: Gynecology;  Laterality: N/A;  . TUBAL LIGATION    . UMBILICAL HERNIA REPAIR     as a child    OB History    Gravida Para Term Preterm AB Living   2 1 1  0 1 1   SAB TAB Ectopic Multiple Live Births   1 0 0 0 1       Home  Medications    Prior to Admission medications   Medication Sig Start Date End Date Taking? Authorizing Provider  albuterol (PROVENTIL HFA;VENTOLIN HFA) 108 (90 Base) MCG/ACT inhaler Inhale 2 puffs into the lungs every 4 (four) hours as needed for wheezing or shortness of breath (cough). 12/04/16   Maryellen Pile, MD  budesonide-formoterol (SYMBICORT) 80-4.5 MCG/ACT inhaler Inhale 2 puffs into the lungs 2 (two) times daily. 12/04/16 01/13/18  Maryellen Pile, MD  diclofenac (VOLTAREN) 75 MG EC tablet Take 1 tablet (75 mg total) by mouth 2 (two) times daily. 03/06/17   Vanessa Kick, MD  guaiFENesin-codeine 100-10 MG/5ML syrup Take 10 mLs by mouth  every 6 (six) hours as needed for cough. 06/01/17   Veryl Speak, MD  metroNIDAZOLE (FLAGYL) 500 MG tablet Take 1 tablet (500 mg total) by mouth 2 (two) times daily. Patient not taking: Reported on 01/15/2017 01/03/17   Shelly Bombard, MD  pantoprazole (PROTONIX) 40 MG tablet Take 1 tablet (40 mg total) by mouth daily. Patient not taking: Reported on 01/01/2017 07/29/16 07/29/17  Shela Leff, MD    Family History Family History  Problem Relation Age of Onset  . Hypertension Mother   . Cancer Maternal Aunt   . Cancer Maternal Uncle   . Diabetes Maternal Grandmother   . Stroke Other        aunt  . Sickle cell anemia Other   . Anesthesia problems Neg Hx   . Hypotension Neg Hx   . Malignant hyperthermia Neg Hx   . Pseudochol deficiency Neg Hx     Social History Social History   Tobacco Use  . Smoking status: Former Smoker    Packs/day: 0.50    Types: Cigarettes, Cigars    Start date: 07/09/1995    Last attempt to quit: 11/20/2011    Years since quitting: 5.5  . Smokeless tobacco: Never Used  . Tobacco comment: pt quit cigarettes in 2009, pt smoke 2 black and milds a day until 09-2011  Substance Use Topics  . Alcohol use: Yes    Alcohol/week: 0.0 oz    Comment: Occasionally, "maybe 1-2 times/month"  . Drug use: Yes    Frequency: 2.0 times per week    Types: Marijuana     Allergies   Morphine and related; Aspirin; Contrast media [iodinated diagnostic agents]; Tramadol; and Vicodin [hydrocodone-acetaminophen]   Review of Systems Review of Systems  A complete review of systems was obtained and all systems are negative except as noted in the HPI and PMH.    Physical Exam Updated Vital Signs BP 107/79   Pulse 96   Temp (!) 97.4 F (36.3 C) (Oral)   Resp 15   LMP 05/01/2012   SpO2 97%   Physical Exam  Constitutional: She is oriented to person, place, and time. She appears well-developed and well-nourished. No distress.  HENT:  Head: Normocephalic.   Mouth/Throat: Oropharynx is clear and moist.  Eyes: Conjunctivae are normal.  Neck: Normal range of motion. No JVD present. No tracheal deviation present.  Cardiovascular: Normal rate, regular rhythm and intact distal pulses.  Radial pulse equal bilaterally  Pulmonary/Chest: Effort normal and breath sounds normal. No stridor. No respiratory distress. She has no wheezes. She has no rales. She exhibits no tenderness.  Abdominal: Soft. She exhibits no distension and no mass. There is no tenderness. There is no rebound and no guarding.  Musculoskeletal: Normal range of motion. She exhibits no edema or tenderness.  No calf asymmetry, superficial collaterals, palpable cords, edema, Homans  sign negative bilaterally.    Neurological: She is alert and oriented to person, place, and time.  Skin: Skin is warm. She is not diaphoretic.  Psychiatric:  Mild agitation  Nursing note and vitals reviewed.    ED Treatments / Results  Labs (all labs ordered are listed, but only abnormal results are displayed) Labs Reviewed  CBC WITH DIFFERENTIAL/PLATELET - Abnormal; Notable for the following components:      Result Value   Hemoglobin 15.6 (*)    Lymphs Abs 4.4 (*)    All other components within normal limits  COMPREHENSIVE METABOLIC PANEL - Abnormal; Notable for the following components:   Potassium 3.4 (*)    Glucose, Bld 106 (*)    AST 81 (*)    All other components within normal limits  D-DIMER, QUANTITATIVE (NOT AT [facility])  I-STAT TROPONIN, ED  I-STAT BETA HCG BLOOD, ED ([facility], [facility], [facility] ONLY)    EKG  EKG Interpretation  Date/Time:  Sunday June 01 2017 08:37:31 EST Ventricular Rate:  71 PR Interval:    QRS Duration: 82 QT Interval:  388 QTC Calculation: 422 R Axis:   80 Text Interpretation:  Sinus rhythm Borderline T wave abnormalities No acute changes Confirmed by Brantley Stage [phone]) on 06/01/2017 9:02:46 AM       Radiology Dg Chest 2 View  Result Date: 05/31/2017 CLINICAL DATA:   Right rib pain for 2 days. Cough and chest pain for 3 days. Smoker. EXAM: CHEST  2 VIEW COMPARISON:  11/08/2014 FINDINGS: Normal heart size and pulmonary vascularity. No focal airspace disease or consolidation in the lungs. No blunting of costophrenic angles. No pneumothorax. Mediastinal contours appear intact. Postoperative changes in the cervical and lumbar spine. Surgical clips in the right upper quadrant. IMPRESSION: No active cardiopulmonary disease. Electronically Signed   By: Lucienne Capers M.D.   On: 05/31/2017 22:42    Procedures Procedures (including critical care time)  Medications Ordered in ED Medications  lidocaine (LIDODERM) 5 % 1 patch (1 patch Transdermal Patch Applied 06/01/17 0902)     Initial Impression / Assessment and Plan / ED Course  I have reviewed the triage vital signs and the nursing notes.  Pertinent labs & imaging results that were available during my care of the patient were reviewed by me and considered in my medical decision making (see chart for details).     Vitals:   06/01/17 1000 06/01/17 1015 06/01/17 1030 06/01/17 1045  BP: 107/74 113/78 97/69 107/79  Pulse: 84 74 70 96  Resp:    15  Temp:      TempSrc:      SpO2: 96% 100% 97% 97%    Medications  lidocaine (LIDODERM) 5 % 1 patch (1 patch Transdermal Patch Applied 06/01/17 0902)    Megan Chandler is 41 y.o. female presenting with recurrent chest pain states it is worse after taking cough medication.  Patient with history of PE and she is not anticoagulated however she did not have any hypoxia, tachypnea, tachycardia or hypotension to suggest PE.  Will obtain d-dimer.  EKG unchanged, patient is low risk by heart score.  Chest x-ray performed several hours ago without infiltrate, I do not think there is value in repeating this at this time.  Blood work otherwise unchanged and reassuring.  She feels much better after being given a Lidoderm patch.  I think her pain may be coming from chest wall  pain from frequent coughing.  Advised high-dose NSAIDs.  Initially she stated that her  pain was worse when lying down however after being given a Lidoderm patch she can recline comfortably without complication.  Evaluation does not show pathology that would require ongoing emergent intervention or inpatient treatment. Pt is hemodynamically stable and mentating appropriately. Discussed findings and plan with patient/guardian, who agrees with care plan. All questions answered. Return precautions discussed and outpatient follow up given.    Final Clinical Impressions(s) / ED Diagnoses   Final diagnoses:  Upper respiratory tract infection, unspecified type  Chest wall pain    ED Discharge Orders    None       Waynetta Pean 06/01/17 1115    Forde Dandy, MD 06/02/17 [id number]

## 2017-06-01 NOTE — ED Triage Notes (Signed)
Pt arrives via [facility] from home for c/o cp/epigastric pain, seen here last night, dx with URI. No meds given pta. Pt has hx of pe, no blood thinners. Pt a/ox4, vss. Pt removed ems IV en route.

## 2017-06-01 NOTE — ED Notes (Signed)
ED Provider at bedside. 

## 2017-06-01 NOTE — Discharge Instructions (Signed)
For pain control please take ibuprofen (also known as Motrin or Advil) 800mg  (this is normally 4 over the counter pills) 3 times a day  for 5 days. Take with food to minimize stomach irritation.  Do not hesitate to return to the emergency room for any new, worsening or concerning symptoms.  Please obtain primary care using resource guide below. Let them know that you were seen in the emergency room and that they will need to obtain records for further outpatient management.

## 2017-06-25 ENCOUNTER — Ambulatory Visit: Payer: Self-pay | Admitting: Obstetrics

## 2017-06-26 ENCOUNTER — Ambulatory Visit: Payer: Self-pay | Admitting: Obstetrics

## 2017-10-13 ENCOUNTER — Emergency Department (HOSPITAL_COMMUNITY): Payer: Medicaid Other

## 2017-10-13 ENCOUNTER — Emergency Department (HOSPITAL_COMMUNITY)
Admission: EM | Admit: 2017-10-13 | Discharge: 2017-10-14 | Disposition: A | Discharge status: Home / Self Care | Payer: Medicaid Other | Attending: Emergency Medicine | Admitting: Emergency Medicine

## 2017-10-13 ENCOUNTER — Other Ambulatory Visit: Payer: Self-pay

## 2017-10-13 DIAGNOSIS — R42 Dizziness and giddiness: Secondary | ICD-10-CM | POA: Insufficient documentation

## 2017-10-13 DIAGNOSIS — Z87891 Personal history of nicotine dependence: Secondary | ICD-10-CM | POA: Insufficient documentation

## 2017-10-13 DIAGNOSIS — J45909 Unspecified asthma, uncomplicated: Secondary | ICD-10-CM | POA: Diagnosis not present

## 2017-10-13 DIAGNOSIS — F419 Anxiety disorder, unspecified: Secondary | ICD-10-CM | POA: Insufficient documentation

## 2017-10-13 DIAGNOSIS — H5461 Unqualified visual loss, right eye, normal vision left eye: Secondary | ICD-10-CM | POA: Insufficient documentation

## 2017-10-13 DIAGNOSIS — Z79899 Other long term (current) drug therapy: Secondary | ICD-10-CM | POA: Insufficient documentation

## 2017-10-13 DIAGNOSIS — R51 Headache: Secondary | ICD-10-CM | POA: Diagnosis not present

## 2017-10-13 DIAGNOSIS — H547 Unspecified visual loss: Secondary | ICD-10-CM

## 2017-10-13 LAB — DIFFERENTIAL
BASOS ABS: 0 10*3/uL (ref 0.0–0.1)
BASOS PCT: 0 %
Eosinophils Absolute: 0.7 10*3/uL (ref 0.0–0.7)
Eosinophils Relative: 5 %
LYMPHS ABS: 4.6 10*3/uL — AB (ref 0.7–4.0)
LYMPHS PCT: 38 %
MONOS PCT: 5 %
Monocytes Absolute: 0.6 10*3/uL (ref 0.1–1.0)
NEUTROS ABS: 6.2 10*3/uL (ref 1.7–7.7)
Neutrophils Relative %: 52 %

## 2017-10-13 LAB — RAPID URINE DRUG SCREEN, HOSP PERFORMED
Amphetamines: NOT DETECTED
BARBITURATES: NOT DETECTED
BENZODIAZEPINES: NOT DETECTED
COCAINE: NOT DETECTED
Opiates: NOT DETECTED
TETRAHYDROCANNABINOL: POSITIVE — AB

## 2017-10-13 LAB — COMPREHENSIVE METABOLIC PANEL
ALK PHOS: 89 U/L (ref 38–126)
ALT: 17 U/L (ref 14–54)
AST: 22 U/L (ref 15–41)
Albumin: 4.3 g/dL (ref 3.5–5.0)
Anion gap: 10 (ref 5–15)
BUN: 8 mg/dL (ref 6–20)
CHLORIDE: 107 mmol/L (ref 101–111)
CO2: 23 mmol/L (ref 22–32)
CREATININE: 0.86 mg/dL (ref 0.44–1.00)
Calcium: 9.6 mg/dL (ref 8.9–10.3)
GFR calc Af Amer: >60 mL/min (ref >60)
Glucose, Bld: 97 mg/dL (ref 65–99)
Potassium: 4 mmol/L (ref 3.5–5.1)
Sodium: 140 mmol/L (ref 135–145)
Total Bilirubin: 0.9 mg/dL (ref 0.3–1.2)
Total Protein: 7.6 g/dL (ref 6.5–8.1)

## 2017-10-13 LAB — I-STAT BETA HCG BLOOD, ED (MC, WL, AP ONLY): I-stat hCG, quantitative: <5 m[IU]/mL (ref <5)

## 2017-10-13 LAB — URINALYSIS, ROUTINE W REFLEX MICROSCOPIC
Bacteria, UA: NONE SEEN
Bilirubin Urine: NEGATIVE
Glucose, UA: NEGATIVE mg/dL
Ketones, ur: NEGATIVE mg/dL
Leukocytes, UA: NEGATIVE
Nitrite: NEGATIVE
PH: 6 (ref 5.0–8.0)
Protein, ur: NEGATIVE mg/dL
SPECIFIC GRAVITY, URINE: 1.016 (ref 1.005–1.030)

## 2017-10-13 LAB — I-STAT CHEM 8, ED
BUN: 9 mg/dL (ref 6–20)
CHLORIDE: 107 mmol/L (ref 101–111)
CREATININE: 0.7 mg/dL (ref 0.44–1.00)
Calcium, Ion: 1.16 mmol/L (ref 1.15–1.40)
GLUCOSE: 95 mg/dL (ref 65–99)
HCT: 50 % — ABNORMAL HIGH (ref 36.0–46.0)
HEMOGLOBIN: 17 g/dL — AB (ref 12.0–15.0)
POTASSIUM: 3.9 mmol/L (ref 3.5–5.1)
Sodium: 141 mmol/L (ref 135–145)
TCO2: 23 mmol/L (ref 22–32)

## 2017-10-13 LAB — CBC
HEMATOCRIT: 46.3 % — AB (ref 36.0–46.0)
HEMOGLOBIN: 15.9 g/dL — AB (ref 12.0–15.0)
MCH: 32.4 pg (ref 26.0–34.0)
MCHC: 34.3 g/dL (ref 30.0–36.0)
MCV: 94.5 fL (ref 78.0–100.0)
Platelets: 324 10*3/uL (ref 150–400)
RBC: 4.9 MIL/uL (ref 3.87–5.11)
RDW: 13.4 % (ref 11.5–15.5)
WBC: 12.2 10*3/uL — AB (ref 4.0–10.5)

## 2017-10-13 LAB — I-STAT TROPONIN, ED: TROPONIN I, POC: 0 ng/mL (ref 0.00–0.08)

## 2017-10-13 LAB — PROTIME-INR
INR: 0.94
Prothrombin Time: 12.5 seconds (ref 11.4–15.2)

## 2017-10-13 LAB — APTT: APTT: 35 s (ref 24–36)

## 2017-10-13 MED ORDER — ACETAMINOPHEN 500 MG PO TABS
1000.0000 mg | ORAL_TABLET | Freq: Once | ORAL | Status: AC
Start: 2017-10-13 — End: 2017-10-13
  Administered 2017-10-13: 1000 mg via ORAL
  Filled 2017-10-13: qty 2

## 2017-10-13 NOTE — ED Notes (Signed)
Pt requesting something for headache. 

## 2017-10-13 NOTE — ED Triage Notes (Signed)
Pt states woke up from her nap and she could see.  She became acutely dizzy and her R vision became blurred.  LSN [id number].

## 2017-10-13 NOTE — ED Notes (Signed)
Pt to MRI at this time.

## 2017-10-13 NOTE — Consult Note (Signed)
NEURO HOSPITALIST CONSULT NOTE   Requestig physician: Dr. Rex Kras  Reason for Consult: Right central scotoma of acute onset  History obtained from:  Patient     HPI:                                                                                                                                          Megan Chandler is an 42 y.o. female who today at 4:10 PM woke up with some dizziness which she describes as a combined presyncopal and vertiginous feeling, followed by acute onset of central scotoma involving her right eye while cooking in her kitchen. She states that the blind spot involves her central vision with preservation of vision in her peripheral fields. She states that the spot moves around when she moves her eye and that she seems to feel that she can "see blood" within her left eye when looking upwards. She denies hemianopsia or vision changes affecting her left eye. She has a history of "blood clots in the heart and lungs" but denies prior stroke.   CT head in the ED is negative for acute findings.   Past Medical History:  Diagnosis Date  . Adenomyosis   . Anemia   . Anxiety   . Asthma    daily inhaler use  . Bartholin's cyst    hx of  . Bronchitis   . Endometriosis   . GERD (gastroesophageal reflux disease)   . Menometrorrhagia   . Pulmonary embolism ([place])    11/2011/ states no since  anticoagulation 1/14  . Sciatica     Past Surgical History:  Procedure Laterality Date  . ABDOMINAL HYSTERECTOMY    . BACK SURGERY     07/2011  . BILATERAL SALPINGECTOMY Bilateral 10/09/2012   Procedure: BILATERAL SALPINGECTOMY;  Surgeon: Lahoma Crocker, MD;  Location: [facility];  Service: Gynecology;  Laterality: Bilateral;  . CERVICAL SPINE SURGERY  2009  . CESAREAN SECTION  1996  . CHOLECYSTECTOMY, LAPAROSCOPIC  1998  . DILATION AND CURETTAGE OF UTERUS    . HYSTEROSCOPY  04/02/2012   Procedure: HYSTEROSCOPY;  Surgeon: Lahoma Crocker, MD;  Location: [facility];   Service: Gynecology;  Laterality: N/A;  with Dilitation and curretage and attempted hydrothermal ablation  . LAPAROSCOPIC TUBAL LIGATION  02/12/2012   Procedure: LAPAROSCOPIC TUBAL LIGATION;  Surgeon: Lahoma Crocker, MD;  Location: [facility];  Service: Gynecology;  Laterality: Bilateral;  . LAPAROSCOPY  at age 46  . LUMBAR SPINE SURGERY    . ROBOTIC ASSISTED LAP VAGINAL HYSTERECTOMY N/A 10/09/2012   Procedure: ROBOTIC ASSISTED LAPAROSCOPIC VAGINAL HYSTERECTOMY;  Surgeon: Lahoma Crocker, MD;  Location: [facility];  Service: Gynecology;  Laterality: N/A;  . TUBAL LIGATION    . UMBILICAL HERNIA REPAIR     as a child  Family History  Problem Relation Age of Onset  . Hypertension Mother   . Cancer Maternal Aunt   . Cancer Maternal Uncle   . Diabetes Maternal Grandmother   . Stroke Other        aunt  . Sickle cell anemia Other   . Anesthesia problems Neg Hx   . Hypotension Neg Hx   . Malignant hyperthermia Neg Hx   . Pseudochol deficiency Neg Hx               Social History:  reports that she quit smoking about 5 years ago. Her smoking use included cigarettes and cigars. She started smoking about 22 years ago. She smoked 0.50 packs per day. She has never used smokeless tobacco. She reports that she drinks alcohol. She reports that she has current or past drug history. Drug: Marijuana. Frequency: 2.00 times per week.  Allergies  Allergen Reactions  . Morphine And Related Anaphylaxis    Pt can take dilaudid  . Aspirin Itching    Pt can tolerate ibuprofen  . Contrast Media [Iodinated Diagnostic Agents] Nausea And Vomiting  . Tramadol Itching  . Vicodin [Hydrocodone-Acetaminophen] Itching    Pt can take percocet    MEDICATIONS:                                                                                                                      ROS:                                                                                                                                       As  per HPI.    Blood pressure (!) 189/141, pulse (!) 101, temperature 98.6 F (37 C), temperature source Oral, resp. rate (!) 22, last menstrual period 05/01/2012, SpO2 100 %.   General Examination:  Physical Exam  HEENT-  [place]/AT   Lungs- Wheezy tonality to vocalizations but no respiratory distress Extremities- No edema  Neurological Examination Mental Status: Alert, oriented, thought content appropriate.  Speech fluent without evidence of aphasia.  Able to follow all commands without difficulty. Cranial Nerves: II: PERRL without miosis or mydriasis. No RAPD. Vision in left eye is normal. Visual acuity in right eye impaired centrally. Also with contriction of visual fields all 4 quadrants of right eye.   III,IV, VI: No ptosis. EOMI without nystagmus.  VII: Smile symmetric.  VIII: hearing intact to conversation IX,X: Hoarse/hypophonic speech quality XI: Symmetric shoulder shrug XII: midline tongue extension Motor: Moves upper and lower extremities antigravity symmetrically.  Sensory: Light touch intact x 4  Cerebellar: No ataxia with FNF bilaterally Gait: Deferred   Lab Results: Basic Metabolic Panel: Recent Labs  Lab 10/13/17 1731  NA 141  K 3.9  CL 107  GLUCOSE 95  BUN 9  CREATININE 0.70    CBC: Recent Labs  Lab 10/13/17 1731  HGB 17.0*  HCT 50.0*    Cardiac Enzymes: No results for input(s): CKTOTAL, CKMB, CKMBINDEX, TROPONINI in the last 168 hours.  Lipid Panel: No results for input(s): CHOL, TRIG, HDL, CHOLHDL, VLDL, LDLCALC in the last 168 hours.  Imaging: Ct Head Code Stroke Wo Contrast  Result Date: 10/13/2017 CLINICAL DATA:  Code stroke. Dizziness and RIGHT blurry vision. History of cervical spine fusion. EXAM: CT HEAD WITHOUT CONTRAST TECHNIQUE: Contiguous axial images were obtained from the base of the skull through the vertex without intravenous contrast.  COMPARISON:  None. FINDINGS: BRAIN: No intraparenchymal hemorrhage, mass effect nor midline shift. The ventricles and sulci are normal. No acute large vascular territory infarcts. No abnormal extra-axial fluid collections. Basal cisterns are patent. VASCULAR: Unremarkable. SKULL/SOFT TISSUES: No skull fracture. No significant soft tissue swelling. ORBITS/SINUSES: The included ocular globes and orbital contents are normal.Trace paranasal sinus mucosal thickening. Mastoid air cells and petrous apices are well aerated. OTHER: None. ASPECTS [facility] Stroke Program Early CT Score) - Ganglionic level infarction (caudate, lentiform nuclei, internal capsule, insula, M1-M3 cortex): 7 - Supraganglionic infarction (M4-M6 cortex): 3 Total score (0-10 with 10 being normal): 10 IMPRESSION: 1. Normal noncontrast CT HEAD. 2. ASPECTS is 10. 3. Critical Value/emergent results text paged to [place], Neurology via [employer] secure system on 10/13/2017 at 5:40 pm, including interpreting physician's phone number. Electronically Signed   By: Elon Alas M.D.   On: 10/13/2017 17:41   Assessment: 42 year old female with acute monocular vision loss OD 1. Central obscuration of vision with some preservation of visual fields in right eye. Left eye normal vision. DDx for presentation includes CRVO, CRAO, BRAO, retinal hemorrhage and retinal detachment.  2. CT head normal.  3. No aphasia, dysarthria, facial droop, lateralized motor, sensory or coordination deficit on exam.   Recommendations: 1. STAT ophthalmology consult 2. MRI of brain and orbits with contrast   Electronically signed: Dr. Kerney Elbe 10/13/2017, 6:04 PM

## 2017-10-13 NOTE — Discharge Instructions (Addendum)
As we discussed you need to follow-up with the referred eye doctor tomorrow morning. They are expecting you. Call their office tomorrow morning to arrange an appointment time. You will be seeing Dr. Cher Nakai. It is very important that you go to this appointment. Do not miss it.   Return to the Emergency Department for any chest pain, difficulty breathing, eye pain, worsening vision changes or any other worsening or concerning symptoms.

## 2017-10-13 NOTE — ED Notes (Signed)
ED Provider at bedside. 

## 2017-10-13 NOTE — ED Provider Notes (Signed)
[facility] EMERGENCY DEPARTMENT Provider Note   CSN: [id number] Arrival date & time: 10/13/17  1657   An emergency department physician performed an initial assessment on this suspected stroke patient at 1721.  History   Chief Complaint Chief Complaint  Patient presents with  . Loss of Vision    HPI Megan Chandler is a 42 y.o. female with PMH/o PE, Asthma, GERD who presents for evaluation of dizziness and acute vision loss that occurred approximately 1 hour prior to ED arrival.  Patient reports that she had gone down to take a nap and states that she woke up and was feeling her normal self.  Patient reports that she walked into the kitchen when she got there, she started feeling acutely dizzy ", like she was going to pass out."  She denies any room spinning sensation.  Patient states that then she started having vision loss in the right eye.  Patient states that she is having central vision loss but is still able to see on the periphery. Patient denies any pain to the eye. Patient states that when she looks up, she feels like she sees a floater and red blood.  She denies any preceding trauma, injury to the eye.  Patient states she has a history of PEs and "blood clots around her heart."  Patient states that she had previously been on blood thinners but states that she was taken off of them about 2 years ago.  Patient denies any difficulty speaking, facial asymmetry, numbness/weakness of her extremities, chest pain, difficulty breathing.  The history is provided by the patient.    Past Medical History:  Diagnosis Date  . Adenomyosis   . Anemia   . Anxiety   . Asthma    daily inhaler use  . Bartholin's cyst    hx of  . Bronchitis   . Endometriosis   . GERD (gastroesophageal reflux disease)   . Menometrorrhagia   . Pulmonary embolism ([place])    11/2011/ states no since  anticoagulation 1/14  . Sciatica     Patient Active Problem List   Diagnosis Date Noted  .  Frequent falls 12/09/2016  . Grief 03/14/2016  . Left lumbosacral radiculopathy 01/02/2015  . GERD without esophagitis 02/26/2013  . Shortness of breath 04/23/2012  . Anxiety 11/20/2011  . Endometriosis 11/20/2011  . Asthma 11/20/2011    Past Surgical History:  Procedure Laterality Date  . ABDOMINAL HYSTERECTOMY    . BACK SURGERY     07/2011  . BILATERAL SALPINGECTOMY Bilateral 10/09/2012   Procedure: BILATERAL SALPINGECTOMY;  Surgeon: Lahoma Crocker, MD;  Location: [facility];  Service: Gynecology;  Laterality: Bilateral;  . CERVICAL SPINE SURGERY  2009  . CESAREAN SECTION  1996  . CHOLECYSTECTOMY, LAPAROSCOPIC  1998  . DILATION AND CURETTAGE OF UTERUS    . HYSTEROSCOPY  04/02/2012   Procedure: HYSTEROSCOPY;  Surgeon: Lahoma Crocker, MD;  Location: [facility];  Service: Gynecology;  Laterality: N/A;  with Dilitation and curretage and attempted hydrothermal ablation  . LAPAROSCOPIC TUBAL LIGATION  02/12/2012   Procedure: LAPAROSCOPIC TUBAL LIGATION;  Surgeon: Lahoma Crocker, MD;  Location: [facility];  Service: Gynecology;  Laterality: Bilateral;  . LAPAROSCOPY  at age 89  . LUMBAR SPINE SURGERY    . ROBOTIC ASSISTED LAP VAGINAL HYSTERECTOMY N/A 10/09/2012   Procedure: ROBOTIC ASSISTED LAPAROSCOPIC VAGINAL HYSTERECTOMY;  Surgeon: Lahoma Crocker, MD;  Location: [facility];  Service: Gynecology;  Laterality: N/A;  . TUBAL LIGATION    . UMBILICAL  HERNIA REPAIR     as a child     OB History    Gravida  2   Para  1   Term  1   Preterm  0   AB  1   Living  1     SAB  1   TAB  0   Ectopic  0   Multiple  0   Live Births  1            Home Medications    Prior to Admission medications   Medication Sig Start Date End Date Taking? Authorizing Provider  albuterol (PROVENTIL HFA;VENTOLIN HFA) 108 (90 Base) MCG/ACT inhaler Inhale 2 puffs into the lungs every 4 (four) hours as needed for wheezing or shortness of breath (cough). 12/04/16   Maryellen Pile, MD    budesonide-formoterol (SYMBICORT) 80-4.5 MCG/ACT inhaler Inhale 2 puffs into the lungs 2 (two) times daily. 12/04/16 01/13/18  Maryellen Pile, MD  diclofenac (VOLTAREN) 75 MG EC tablet Take 1 tablet (75 mg total) by mouth 2 (two) times daily. 03/06/17   Vanessa Kick, MD  guaiFENesin-codeine 100-10 MG/5ML syrup Take 10 mLs by mouth every 6 (six) hours as needed for cough. 06/01/17   Veryl Speak, MD  metroNIDAZOLE (FLAGYL) 500 MG tablet Take 1 tablet (500 mg total) by mouth 2 (two) times daily. Patient not taking: Reported on 01/15/2017 01/03/17   Shelly Bombard, MD  pantoprazole (PROTONIX) 40 MG tablet Take 1 tablet (40 mg total) by mouth daily. Patient not taking: Reported on 01/01/2017 07/29/16 07/29/17  Shela Leff, MD    Family History Family History  Problem Relation Age of Onset  . Hypertension Mother   . Cancer Maternal Aunt   . Cancer Maternal Uncle   . Diabetes Maternal Grandmother   . Stroke Other        aunt  . Sickle cell anemia Other   . Anesthesia problems Neg Hx   . Hypotension Neg Hx   . Malignant hyperthermia Neg Hx   . Pseudochol deficiency Neg Hx     Social History Social History   Tobacco Use  . Smoking status: Former Smoker    Packs/day: 0.50    Types: Cigarettes, Cigars    Start date: 07/09/1995    Last attempt to quit: 11/20/2011    Years since quitting: 5.9  . Smokeless tobacco: Never Used  . Tobacco comment: pt quit cigarettes in 2009, pt smoke 2 black and milds a day until 09-2011  Substance Use Topics  . Alcohol use: Yes    Alcohol/week: 0.0 oz    Comment: Occasionally, "maybe 1-2 times/month"  . Drug use: Yes    Frequency: 2.0 times per week    Types: Marijuana     Allergies   Morphine and related; Aspirin; Contrast media [iodinated diagnostic agents]; Tramadol; and Vicodin [hydrocodone-acetaminophen]   Review of Systems Review of Systems  Constitutional: Negative for chills and fever.  Eyes: Positive for visual disturbance. Negative  for pain.  Respiratory: Negative for cough and shortness of breath.   Cardiovascular: Negative for chest pain.  Gastrointestinal: Negative for abdominal pain, diarrhea, nausea and vomiting.  Genitourinary: Negative for dysuria and hematuria.  Musculoskeletal: Negative for back pain and neck pain.  Skin: Negative for rash.  Neurological: Positive for dizziness. Negative for facial asymmetry, speech difficulty, weakness, numbness and headaches.  All other systems reviewed and are negative.    Physical Exam Updated Vital Signs BP (!) 189/141 (BP Location: Right Arm)  Pulse (!) 101   Temp 98.6 F (37 C) (Oral)   Resp (!) 22   LMP 05/01/2012   SpO2 100%   Physical Exam  Constitutional: She is oriented to person, place, and time. She appears well-developed and well-nourished.  Tearful and very anxious   HENT:  Head: Normocephalic and atraumatic.  Mouth/Throat: Oropharynx is clear and moist and mucous membranes are normal.  Eyes: Pupils are equal, round, and reactive to light. Conjunctivae, EOM and lids are normal.  Fundoscopic exam:      The right eye shows no hemorrhage and no papilledema.       The left eye shows no hemorrhage and no papilledema.  No conjunctival injection bilaterally.  No evidence of hyphema, hypopyon.  Patient can tell me how many fingers I am holding up though she says it is blurry.  When I hold my fingers in front of her central vision, she is unable to tell me how many including up but I do not want to move them to the periphery, she is able to see them.  Neck: Full passive range of motion without pain.  Cardiovascular: Normal rate, regular rhythm, normal heart sounds and normal pulses. Exam reveals no gallop and no friction rub.  No murmur heard. Pulmonary/Chest: Effort normal and breath sounds normal.  No evidence of respiratory distress. Able to speak in full sentences without difficulty.  Abdominal: Soft. Normal appearance. There is no tenderness. There is  no rigidity and no guarding.  Musculoskeletal: Normal range of motion.  Neurological: She is alert and oriented to person, place, and time.  Cranial nerves III-XII intact Follows commands, Moves all extremities  5/5 strength to BUE and BLE  Sensation intact throughout all major nerve distributions Normal finger to nose. No dysdiadochokinesia. No pronator drift. No gait abnormalities  No slurred speech. No facial droop.   Skin: Skin is warm and dry. Capillary refill takes less than 2 seconds.  Psychiatric: Her speech is normal. Her mood appears anxious.  Nursing note and vitals reviewed.    ED Treatments / Results  Labs (all labs ordered are listed, but only abnormal results are displayed) Labs Reviewed  CBC - Abnormal; Notable for the following components:      Result Value   WBC 12.2 (*)    Hemoglobin 15.9 (*)    HCT 46.3 (*)    All other components within normal limits  DIFFERENTIAL - Abnormal; Notable for the following components:   Lymphs Abs 4.6 (*)    All other components within normal limits  I-STAT CHEM 8, ED - Abnormal; Notable for the following components:   Hemoglobin 17.0 (*)    HCT 50.0 (*)    All other components within normal limits  PROTIME-INR  APTT  COMPREHENSIVE METABOLIC PANEL  ETHANOL  RAPID URINE DRUG SCREEN, HOSP PERFORMED  URINALYSIS, ROUTINE W REFLEX MICROSCOPIC  I-STAT TROPONIN, ED  I-STAT BETA HCG BLOOD, ED ([facility], [facility], [facility] ONLY)  CBG MONITORING, ED    EKG EKG Interpretation  Date/Time:  Monday October 13 2017 17:11:27 EDT Ventricular Rate:  107 PR Interval:  128 QRS Duration: 64 QT Interval:  310 QTC Calculation: 413 R Axis:   81 Text Interpretation:  Sinus tachycardia ST & T wave abnormality, consider inferolateral ischemia Abnormal ECG borderline diffuse ST depression compared to previous, tachycardia new from previous Confirmed by Theotis Burrow [phone]) on 10/13/2017 5:39:45 PM   Radiology Ct Head Code Stroke Wo Contrast  Result  Date: 10/13/2017 CLINICAL DATA:  Code  stroke. Dizziness and RIGHT blurry vision. History of cervical spine fusion. EXAM: CT HEAD WITHOUT CONTRAST TECHNIQUE: Contiguous axial images were obtained from the base of the skull through the vertex without intravenous contrast. COMPARISON:  None. FINDINGS: BRAIN: No intraparenchymal hemorrhage, mass effect nor midline shift. The ventricles and sulci are normal. No acute large vascular territory infarcts. No abnormal extra-axial fluid collections. Basal cisterns are patent. VASCULAR: Unremarkable. SKULL/SOFT TISSUES: No skull fracture. No significant soft tissue swelling. ORBITS/SINUSES: The included ocular globes and orbital contents are normal.Trace paranasal sinus mucosal thickening. Mastoid air cells and petrous apices are well aerated. OTHER: None. ASPECTS [facility] Stroke Program Early CT Score) - Ganglionic level infarction (caudate, lentiform nuclei, internal capsule, insula, M1-M3 cortex): 7 - Supraganglionic infarction (M4-M6 cortex): 3 Total score (0-10 with 10 being normal): 10 IMPRESSION: 1. Normal noncontrast CT HEAD. 2. ASPECTS is 10. 3. Critical Value/emergent results text paged to [place], Neurology via [employer] secure system on 10/13/2017 at 5:40 pm, including interpreting physician's phone number. Electronically Signed   By: Elon Alas M.D.   On: 10/13/2017 17:41    Procedures Procedures (including critical care time)  Medications Ordered in ED Medications - No data to display   Initial Impression / Assessment and Plan / ED Course  I have reviewed the triage vital signs and the nursing notes.  Pertinent labs & imaging results that were available during my care of the patient were reviewed by me and considered in my medical decision making (see chart for details).     42 y.o. female who presents for evaluation of acute vision loss that occurred approximate 1 hour prior to ED arrival.  Associated with dizziness.  No history of trauma,  injury to the eye.  History of blood clots in her lungs and states she had previously been on blood thinners but states she has not on him the last 2 years.  No numbness/weakness to extremities, chest pain, shortness of breath, facial asymmetry.  On initial ED arrival, patient is tachycardic and hypertensive.  On exam, patient is able to tell me how many fingers I am holding up and is accurate.  When I move him to the center, she states she can barely see them but when I move the periphery, she subsequently vomited blurry.  Consider CVA versus CRAO versus retinal detachment versus retinal hemorrhage.  I initially evaluated the patient in triage.  Given initial concern for vision loss, concern for acute stroke.  I discussed with ED attending provider who agreed for calling a code stroke.  Labs, CT head ordered.  5:23 PM: Discussed patient with Dr. Cheral Marker (neuro).  Given presentation, low suspicion for CVA at this time.  Recommends obtaining a stat MRI and paging optho for an emergent consult.   5:30 PM: Attempted to call optho but no answer. Will page.    5:41 PM: Optho paged.     5:52 PM: Discussed patient with Dr. Cher Nakai (Optho). Given that patient is not having total vision loss, low suspicion for CRAO at this time. Agrees with getting MRI and plan to have patient follow-up in the office tomorrow.    6:09 PM: Dr. Rex Kras (ED attending) who evaluated patient also performed bedside U/S that was concerning for retinal detachment. Discussed patient with Dr. Cher Nakai (Optho) regarding updated findings on ultrasound.  Does not recommend any acute intervention at this time.  Ophthalmology states that there would be no intervention that would be needed in the next 24 hours and recommend that patient follow-up  in their office tomorrow morning. Updated Dr. Rex Kras on plan.    BMP shows no abnormalities.  Troponin is negative.  I-STAT beta is negative.  INR is 0.94.   CBC shows slight leukocytosis of 12.2 but  otherwise unremarkable. UA negative for any acute infectious etiology. UDS positive for marijuana. CT head is negative for any acute abnormalities.   Patient signed out to Dr. Rex Kras with MRI attending. If MRI unremarkable, plan to dispo home with outpatient ophthalmology follow-up tomorrow morning.   Final Clinical Impressions(s) / ED Diagnoses   Final diagnoses:  Visual loss    ED Discharge Orders    None       Desma Mcgregor 10/13/17 2121    Little, Wenda Overland, MD 10/15/17 [phone]

## 2017-10-13 NOTE — ED Notes (Signed)
To ct

## 2017-11-19 ENCOUNTER — Ambulatory Visit: Payer: Medicaid Other | Admitting: Obstetrics

## 2017-11-19 ENCOUNTER — Other Ambulatory Visit (HOSPITAL_COMMUNITY)
Admission: RE | Admit: 2017-11-19 | Discharge: 2017-11-19 | Disposition: A | Discharge status: Home / Self Care | Payer: Medicaid Other | Source: Ambulatory Visit | Attending: Obstetrics | Admitting: Obstetrics

## 2017-11-19 ENCOUNTER — Encounter: Payer: Self-pay | Admitting: Obstetrics

## 2017-11-19 VITALS — BP 118/81 | HR 82 | Wt 162.4 lb

## 2017-11-19 DIAGNOSIS — B3731 Acute candidiasis of vulva and vagina: Secondary | ICD-10-CM

## 2017-11-19 DIAGNOSIS — N951 Menopausal and female climacteric states: Secondary | ICD-10-CM | POA: Diagnosis not present

## 2017-11-19 DIAGNOSIS — B373 Candidiasis of vulva and vagina: Secondary | ICD-10-CM | POA: Insufficient documentation

## 2017-11-19 DIAGNOSIS — H44811 Hemophthalmos, right eye: Secondary | ICD-10-CM

## 2017-11-19 MED ORDER — TERCONAZOLE 0.8 % VA CREA: 1.0000 | TOPICAL_CREAM | Freq: Every day | VAGINAL | 2 refills | Status: DC

## 2017-11-19 NOTE — Progress Notes (Signed)
Presents for infection, says its personal.

## 2017-11-19 NOTE — Progress Notes (Signed)
Patient ID: Megan Chandler, female   DOB: 03/12/1976, 42 y.o.   MRN: [id number]  Chief Complaint  Patient presents with  . Gynecologic Exam    HPI Megan Chandler is a 42 y.o. female.  Vaginal discharge and itching.  Has had recent hemorrhage in her right eye which has resulted in diminished vision.  Continues to have hot flashes. HPI  Past Medical History:  Diagnosis Date  . Adenomyosis   . Anemia   . Anxiety   . Asthma    daily inhaler use  . Bartholin's cyst    hx of  . Bronchitis   . Endometriosis   . GERD (gastroesophageal reflux disease)   . Menometrorrhagia   . Pulmonary embolism ([place])    11/2011/ states no since  anticoagulation 1/14  . Sciatica     Past Surgical History:  Procedure Laterality Date  . ABDOMINAL HYSTERECTOMY    . BACK SURGERY     07/2011  . BILATERAL SALPINGECTOMY Bilateral 10/09/2012   Procedure: BILATERAL SALPINGECTOMY;  Surgeon: Lahoma Crocker, MD;  Location: [facility];  Service: Gynecology;  Laterality: Bilateral;  . CERVICAL SPINE SURGERY  2009  . CESAREAN SECTION  1996  . CHOLECYSTECTOMY, LAPAROSCOPIC  1998  . DILATION AND CURETTAGE OF UTERUS    . HYSTEROSCOPY  04/02/2012   Procedure: HYSTEROSCOPY;  Surgeon: Lahoma Crocker, MD;  Location: [facility];  Service: Gynecology;  Laterality: N/A;  with Dilitation and curretage and attempted hydrothermal ablation  . LAPAROSCOPIC TUBAL LIGATION  02/12/2012   Procedure: LAPAROSCOPIC TUBAL LIGATION;  Surgeon: Lahoma Crocker, MD;  Location: [facility];  Service: Gynecology;  Laterality: Bilateral;  . LAPAROSCOPY  at age 40  . LUMBAR SPINE SURGERY    . ROBOTIC ASSISTED LAP VAGINAL HYSTERECTOMY N/A 10/09/2012   Procedure: ROBOTIC ASSISTED LAPAROSCOPIC VAGINAL HYSTERECTOMY;  Surgeon: Lahoma Crocker, MD;  Location: [facility];  Service: Gynecology;  Laterality: N/A;  . TUBAL LIGATION    . UMBILICAL HERNIA REPAIR     as a child    Family History  Problem Relation Age of Onset  . Hypertension Mother   .  Cancer Maternal Aunt   . Cancer Maternal Uncle   . Diabetes Maternal Grandmother   . Stroke Other        aunt  . Sickle cell anemia Other   . Anesthesia problems Neg Hx   . Hypotension Neg Hx   . Malignant hyperthermia Neg Hx   . Pseudochol deficiency Neg Hx     Social History Social History   Tobacco Use  . Smoking status: Former Smoker    Packs/day: 0.50    Types: Cigarettes, Cigars    Start date: 07/09/1995    Last attempt to quit: 11/20/2011    Years since quitting: 6.0  . Smokeless tobacco: Never Used  . Tobacco comment: pt quit cigarettes in 2009, pt smoke 2 black and milds a day until 09-2011  Substance Use Topics  . Alcohol use: Yes    Alcohol/week: 0.0 oz    Comment: Occasionally, "maybe 1-2 times/month"  . Drug use: Yes    Frequency: 2.0 times per week    Types: Marijuana    Allergies  Allergen Reactions  . Morphine And Related Anaphylaxis    Pt can take dilaudid  . Aspirin Itching    Pt can tolerate ibuprofen  . Contrast Media [Iodinated Diagnostic Agents] Nausea And Vomiting  . Tramadol Itching  . Vicodin [Hydrocodone-Acetaminophen] Itching    Pt can take percocet  Current Outpatient Medications  Medication Sig Dispense Refill  . albuterol (PROVENTIL HFA;VENTOLIN HFA) 108 (90 Base) MCG/ACT inhaler Inhale 2 puffs into the lungs every 4 (four) hours as needed for wheezing or shortness of breath (cough). 1 Inhaler 6  . albuterol (PROVENTIL) (2.5 MG/3ML) 0.083% nebulizer solution Inhale 2.5 mg into the lungs every 6 (six) hours as needed.  2  . budesonide-formoterol (SYMBICORT) 80-4.5 MCG/ACT inhaler Inhale 2 puffs into the lungs 2 (two) times daily. (Patient taking differently: Inhale 2 puffs into the lungs as needed. ) 1 Inhaler 5  . cyclobenzaprine (FLEXERIL) 5 MG tablet Take 5 mg by mouth every 6 (six) hours as needed.  0  . diclofenac (VOLTAREN) 75 MG EC tablet Take 1 tablet (75 mg total) by mouth 2 (two) times daily. (Patient not taking: Reported on  10/13/2017) 14 tablet 0  . guaiFENesin-codeine 100-10 MG/5ML syrup Take 10 mLs by mouth every 6 (six) hours as needed for cough. (Patient not taking: Reported on 10/13/2017) 120 mL 0  . meloxicam (MOBIC) 15 MG tablet Take 15 mg by mouth daily.  0  . terconazole (TERAZOL 3) 0.8 % vaginal cream Place 1 applicator vaginally at bedtime. 20 g 2   No current facility-administered medications for this visit.     Review of Systems Review of Systems Constitutional: negative for fatigue and weight loss Respiratory: negative for cough and wheezing Cardiovascular: negative for chest pain, fatigue and palpitations Gastrointestinal: negative for abdominal pain and change in bowel habits Genitourinary:POSITIVE for vaginal discharge and itching Integument/breast: negative for nipple discharge Musculoskeletal:negative for myalgias Neurological: negative for gait problems and tremors Behavioral/Psych: negative for abusive relationship, depression Endocrine: POSITIVE for hot flashes   Blood pressure 118/81, pulse 82, weight 162 lb 6.4 oz (73.7 kg), last menstrual period 05/01/2012.  Physical Exam Physical Exam           General:  Alert and no distress Abdomen:  normal findings: no organomegaly, soft, non-tender and no hernia  Pelvis:  External genitalia: normal general appearance Urinary system: urethral meatus normal and bladder without fullness, nontender Vaginal: normal without tenderness, induration or masses Cervix: normal appearance Adnexa: normal bimanual exam Uterus: anteverted and non-tender, normal size    50% of 15 min visit spent on counseling and coordination of care.   Data Reviewed Wet Prep  Assessment     1. Candida vaginitis Rx: - Cervicovaginal ancillary only - terconazole (TERAZOL 3) 0.8 % vaginal cream; Place 1 applicator vaginally at bedtime.  Dispense: 20 g; Refill: 2  2. Menopausal symptoms - will try some of the herbal products - would avoid estrogens, with her ocular  bleeding history  3. Ocular hemorrhage, right - stable.  Managed by Ophthalmology    Plan    Follow up in 6 months   No orders of the defined types were placed in this encounter.  Meds ordered this encounter  Medications  . terconazole (TERAZOL 3) 0.8 % vaginal cream    Sig: Place 1 applicator vaginally at bedtime.    Dispense:  20 g    Refill:  2    Shelly Bombard MD 11-19-2017

## 2017-11-20 ENCOUNTER — Other Ambulatory Visit: Payer: Self-pay | Admitting: Obstetrics

## 2017-11-20 DIAGNOSIS — B9689 Other specified bacterial agents as the cause of diseases classified elsewhere: Secondary | ICD-10-CM

## 2017-11-20 DIAGNOSIS — N760 Acute vaginitis: Principal | ICD-10-CM

## 2017-11-20 LAB — CERVICOVAGINAL ANCILLARY ONLY
BACTERIAL VAGINITIS: POSITIVE — AB
CANDIDA VAGINITIS: NEGATIVE
TRICH (WINDOWPATH): NEGATIVE

## 2017-11-20 MED ORDER — SECNIDAZOLE 2 G PO PACK: 1.0000 | PACK | Freq: Once | ORAL | 2 refills | Status: AC

## 2018-08-17 ENCOUNTER — Emergency Department (HOSPITAL_COMMUNITY): Payer: Medicaid Other

## 2018-08-17 ENCOUNTER — Other Ambulatory Visit: Payer: Self-pay

## 2018-08-17 ENCOUNTER — Inpatient Hospital Stay (HOSPITAL_COMMUNITY)
Admission: EM | Admit: 2018-08-17 | Discharge: 2018-08-19 | DRG: 202 | Disposition: A | Discharge status: Home / Self Care | Payer: Medicaid Other | Attending: Family Medicine | Admitting: Family Medicine

## 2018-08-17 DIAGNOSIS — Z87891 Personal history of nicotine dependence: Secondary | ICD-10-CM

## 2018-08-17 DIAGNOSIS — J45901 Unspecified asthma with (acute) exacerbation: Principal | ICD-10-CM | POA: Diagnosis present

## 2018-08-17 DIAGNOSIS — Z833 Family history of diabetes mellitus: Secondary | ICD-10-CM

## 2018-08-17 DIAGNOSIS — Z7951 Long term (current) use of inhaled steroids: Secondary | ICD-10-CM

## 2018-08-17 DIAGNOSIS — Z79899 Other long term (current) drug therapy: Secondary | ICD-10-CM

## 2018-08-17 DIAGNOSIS — Z886 Allergy status to analgesic agent status: Secondary | ICD-10-CM

## 2018-08-17 DIAGNOSIS — F419 Anxiety disorder, unspecified: Secondary | ICD-10-CM | POA: Diagnosis present

## 2018-08-17 DIAGNOSIS — Z791 Long term (current) use of non-steroidal anti-inflammatories (NSAID): Secondary | ICD-10-CM

## 2018-08-17 DIAGNOSIS — Z90710 Acquired absence of both cervix and uterus: Secondary | ICD-10-CM

## 2018-08-17 DIAGNOSIS — J189 Pneumonia, unspecified organism: Secondary | ICD-10-CM

## 2018-08-17 DIAGNOSIS — Z8249 Family history of ischemic heart disease and other diseases of the circulatory system: Secondary | ICD-10-CM

## 2018-08-17 DIAGNOSIS — Z832 Family history of diseases of the blood and blood-forming organs and certain disorders involving the immune mechanism: Secondary | ICD-10-CM

## 2018-08-17 DIAGNOSIS — Z885 Allergy status to narcotic agent status: Secondary | ICD-10-CM

## 2018-08-17 DIAGNOSIS — Z86711 Personal history of pulmonary embolism: Secondary | ICD-10-CM

## 2018-08-17 DIAGNOSIS — E876 Hypokalemia: Secondary | ICD-10-CM

## 2018-08-17 DIAGNOSIS — Z91041 Radiographic dye allergy status: Secondary | ICD-10-CM

## 2018-08-17 DIAGNOSIS — R0602 Shortness of breath: Secondary | ICD-10-CM

## 2018-08-17 DIAGNOSIS — Z79891 Long term (current) use of opiate analgesic: Secondary | ICD-10-CM

## 2018-08-17 DIAGNOSIS — Z823 Family history of stroke: Secondary | ICD-10-CM

## 2018-08-17 DIAGNOSIS — R0603 Acute respiratory distress: Secondary | ICD-10-CM | POA: Diagnosis present

## 2018-08-17 DIAGNOSIS — K219 Gastro-esophageal reflux disease without esophagitis: Secondary | ICD-10-CM | POA: Diagnosis present

## 2018-08-17 DIAGNOSIS — F1290 Cannabis use, unspecified, uncomplicated: Secondary | ICD-10-CM | POA: Diagnosis present

## 2018-08-17 DIAGNOSIS — A084 Viral intestinal infection, unspecified: Secondary | ICD-10-CM

## 2018-08-17 LAB — CBC WITH DIFFERENTIAL/PLATELET
Abs Immature Granulocytes: 0.02 10*3/uL (ref 0.00–0.07)
BASOS ABS: 0.1 10*3/uL (ref 0.0–0.1)
Basophils Relative: 1 %
EOS PCT: 3 %
Eosinophils Absolute: 0.2 10*3/uL (ref 0.0–0.5)
HEMATOCRIT: 43.3 % (ref 36.0–46.0)
HEMOGLOBIN: 14.1 g/dL (ref 12.0–15.0)
IMMATURE GRANULOCYTES: 0 %
LYMPHS ABS: 3.2 10*3/uL (ref 0.7–4.0)
LYMPHS PCT: 39 %
MCH: 30.3 pg (ref 26.0–34.0)
MCHC: 32.6 g/dL (ref 30.0–36.0)
MCV: 92.9 fL (ref 80.0–100.0)
Monocytes Absolute: 0.9 10*3/uL (ref 0.1–1.0)
Monocytes Relative: 10 %
NEUTROS PCT: 47 %
NRBC: 0 % (ref 0.0–0.2)
Neutro Abs: 3.9 10*3/uL (ref 1.7–7.7)
Platelets: 254 10*3/uL (ref 150–400)
RBC: 4.66 MIL/uL (ref 3.87–5.11)
RDW: 12.9 % (ref 11.5–15.5)
WBC: 8.3 10*3/uL (ref 4.0–10.5)

## 2018-08-17 LAB — HEPATIC FUNCTION PANEL
ALT: 22 U/L (ref 0–44)
AST: 24 U/L (ref 15–41)
Albumin: 3.6 g/dL (ref 3.5–5.0)
Alkaline Phosphatase: 76 U/L (ref 38–126)
Bilirubin, Direct: <0.1 mg/dL (ref 0.0–0.2)
Total Bilirubin: 0.6 mg/dL (ref 0.3–1.2)
Total Protein: 6.6 g/dL (ref 6.5–8.1)

## 2018-08-17 LAB — BASIC METABOLIC PANEL
ANION GAP: 11 (ref 5–15)
BUN: 9 mg/dL (ref 6–20)
CHLORIDE: 107 mmol/L (ref 98–111)
CO2: 25 mmol/L (ref 22–32)
Calcium: 8.8 mg/dL — ABNORMAL LOW (ref 8.9–10.3)
Creatinine, Ser: 0.89 mg/dL (ref 0.44–1.00)
GFR calc non Af Amer: >60 mL/min (ref >60)
Glucose, Bld: 113 mg/dL — ABNORMAL HIGH (ref 70–99)
Potassium: 3 mmol/L — ABNORMAL LOW (ref 3.5–5.1)
Sodium: 143 mmol/L (ref 135–145)

## 2018-08-17 LAB — I-STAT TROPONIN, ED: Troponin i, poc: 0 ng/mL (ref 0.00–0.08)

## 2018-08-17 LAB — LIPASE, BLOOD: Lipase: 24 U/L (ref 11–51)

## 2018-08-17 LAB — MAGNESIUM: MAGNESIUM: 2.4 mg/dL (ref 1.7–2.4)

## 2018-08-17 MED ORDER — DOXYCYCLINE HYCLATE 100 MG PO TABS
100.0000 mg | ORAL_TABLET | Freq: Once | ORAL | Status: AC
  Administered 2018-08-17: 100 mg via ORAL
  Filled 2018-08-17: qty 1

## 2018-08-17 MED ORDER — RAMELTEON 8 MG PO TABS
8.0000 mg | ORAL_TABLET | Freq: Every evening | ORAL | Status: DC | PRN
  Administered 2018-08-18: 8 mg via ORAL
  Filled 2018-08-17 (×3): qty 1

## 2018-08-17 MED ORDER — ONDANSETRON HCL 4 MG/2ML IJ SOLN: 4.0000 mg | Freq: Four times a day (QID) | INTRAMUSCULAR | Status: DC | PRN

## 2018-08-17 MED ORDER — BENZONATATE 100 MG PO CAPS
100.0000 mg | ORAL_CAPSULE | Freq: Three times a day (TID) | ORAL | Status: DC | PRN
  Administered 2018-08-18: 100 mg via ORAL
  Filled 2018-08-17: qty 1

## 2018-08-17 MED ORDER — SODIUM CHLORIDE 0.9 % IV SOLN
INTRAVENOUS | Status: AC
  Administered 2018-08-17 – 2018-08-18 (×2): via INTRAVENOUS

## 2018-08-17 MED ORDER — METHYLPREDNISOLONE SODIUM SUCC 125 MG IJ SOLR
60.0000 mg | Freq: Three times a day (TID) | INTRAMUSCULAR | Status: DC
  Administered 2018-08-18 (×2): 60 mg via INTRAVENOUS
  Filled 2018-08-17 (×2): qty 2

## 2018-08-17 MED ORDER — SODIUM CHLORIDE 0.9 % IV SOLN
1.0000 g | Freq: Once | INTRAVENOUS | Status: AC
  Administered 2018-08-17: 1 g via INTRAVENOUS
  Filled 2018-08-17: qty 10

## 2018-08-17 MED ORDER — BENZONATATE 100 MG PO CAPS
100.0000 mg | ORAL_CAPSULE | Freq: Once | ORAL | Status: AC
  Administered 2018-08-17: 100 mg via ORAL
  Filled 2018-08-17: qty 1

## 2018-08-17 MED ORDER — POTASSIUM CHLORIDE CRYS ER 20 MEQ PO TBCR
60.0000 meq | EXTENDED_RELEASE_TABLET | Freq: Once | ORAL | Status: AC
  Administered 2018-08-17: 60 meq via ORAL
  Filled 2018-08-17: qty 3

## 2018-08-17 MED ORDER — ALBUTEROL SULFATE (2.5 MG/3ML) 0.083% IN NEBU
2.5000 mg | INHALATION_SOLUTION | RESPIRATORY_TRACT | Status: DC | PRN
  Administered 2018-08-18 – 2018-08-19 (×2): 2.5 mg via RESPIRATORY_TRACT
  Filled 2018-08-17 (×2): qty 3

## 2018-08-17 MED ORDER — SODIUM CHLORIDE 0.9 % IV SOLN
1.0000 g | Freq: Once | INTRAVENOUS | Status: AC
  Administered 2018-08-18: 1 g via INTRAVENOUS
  Filled 2018-08-17 (×2): qty 10

## 2018-08-17 MED ORDER — MAGNESIUM SULFATE 2 GM/50ML IV SOLN
2.0000 g | Freq: Once | INTRAVENOUS | Status: AC
  Administered 2018-08-17: 2 g via INTRAVENOUS
  Filled 2018-08-17: qty 50

## 2018-08-17 MED ORDER — ENOXAPARIN SODIUM 40 MG/0.4ML ~~LOC~~ SOLN
40.0000 mg | SUBCUTANEOUS | Status: DC
  Filled 2018-08-17 (×2): qty 0.4

## 2018-08-17 MED ORDER — DM-GUAIFENESIN ER 30-600 MG PO TB12
2.0000 | ORAL_TABLET | Freq: Two times a day (BID) | ORAL | Status: DC
  Administered 2018-08-18 – 2018-08-19 (×4): 2 via ORAL
  Filled 2018-08-17 (×4): qty 2

## 2018-08-17 MED ORDER — IPRATROPIUM-ALBUTEROL 0.5-2.5 (3) MG/3ML IN SOLN
3.0000 mL | Freq: Four times a day (QID) | RESPIRATORY_TRACT | Status: DC
  Administered 2018-08-17 – 2018-08-18 (×2): 3 mL via RESPIRATORY_TRACT
  Filled 2018-08-17 (×2): qty 3

## 2018-08-17 MED ORDER — IPRATROPIUM-ALBUTEROL 0.5-2.5 (3) MG/3ML IN SOLN
3.0000 mL | Freq: Once | RESPIRATORY_TRACT | Status: AC
  Administered 2018-08-17: 3 mL via RESPIRATORY_TRACT
  Filled 2018-08-17: qty 3

## 2018-08-17 MED ORDER — DOXYCYCLINE HYCLATE 100 MG PO TABS
100.0000 mg | ORAL_TABLET | Freq: Two times a day (BID) | ORAL | Status: DC
  Administered 2018-08-18 – 2018-08-19 (×3): 100 mg via ORAL
  Filled 2018-08-17 (×3): qty 1

## 2018-08-17 NOTE — ED Notes (Signed)
Patient ambulated to the bathroom. Steady gait noted at this time.

## 2018-08-17 NOTE — ED Provider Notes (Signed)
[facility] EMERGENCY DEPARTMENT Provider Note   CSN: [id number] Arrival date & time: 08/17/18  1641     History   Chief Complaint Chief Complaint  Patient presents with  . Shortness of Breath    HPI Megan Chandler is a 43 y.o. female.  The history is provided by the patient and medical records. No language interpreter was used.  Shortness of Breath  Associated symptoms: cough and wheezing   Associated symptoms: no chest pain and no fever    Megan Chandler is a 43 y.o. female with a history of asthma who presents to the Emergency Department complaining of persistent respiratory symptoms for the last 4 weeks.  She was seen at onset of symptoms and diagnosed with the flu.  She was started on Tamiflu.  She felt like she got somewhat improved, but never returned to her full function.  Feels as if she has been coughing and wheezing since the end of December.  She saw her primary care doctor on February 6 because her symptoms acutely worsened where she was started on azithromycin.  She took this as directed and has not noticed any improvement.  She took her last dose today and was no better, therefore went to her primary care doctor again.  She is also been using albuterol neb treatments and Symbicort inhaler.  At her primary care doctor's office today, she was given a neb treatment and EMS was called.  EMS gave her neb treatment and Solu-Medrol 125 mg in route.  She denies much improvement after these medications.  She denies any fever or chills over the last 5 days.    Past Medical History:  Diagnosis Date  . Adenomyosis   . Anemia   . Anxiety   . Asthma    daily inhaler use  . Bartholin's cyst    hx of  . Bronchitis   . Endometriosis   . GERD (gastroesophageal reflux disease)   . Menometrorrhagia   . Pulmonary embolism ([place])    11/2011/ states no since  anticoagulation 1/14  . Sciatica     Patient Active Problem List   Diagnosis Date Noted  .  Frequent falls 12/09/2016  . Grief 03/14/2016  . Left lumbosacral radiculopathy 01/02/2015  . GERD without esophagitis 02/26/2013  . Shortness of breath 04/23/2012  . Anxiety 11/20/2011  . Endometriosis 11/20/2011  . Asthma 11/20/2011    Past Surgical History:  Procedure Laterality Date  . ABDOMINAL HYSTERECTOMY    . BACK SURGERY     07/2011  . BILATERAL SALPINGECTOMY Bilateral 10/09/2012   Procedure: BILATERAL SALPINGECTOMY;  Surgeon: Lahoma Crocker, MD;  Location: [facility];  Service: Gynecology;  Laterality: Bilateral;  . CERVICAL SPINE SURGERY  2009  . CESAREAN SECTION  1996  . CHOLECYSTECTOMY, LAPAROSCOPIC  1998  . DILATION AND CURETTAGE OF UTERUS    . HYSTEROSCOPY  04/02/2012   Procedure: HYSTEROSCOPY;  Surgeon: Lahoma Crocker, MD;  Location: [facility];  Service: Gynecology;  Laterality: N/A;  with Dilitation and curretage and attempted hydrothermal ablation  . LAPAROSCOPIC TUBAL LIGATION  02/12/2012   Procedure: LAPAROSCOPIC TUBAL LIGATION;  Surgeon: Lahoma Crocker, MD;  Location: [facility];  Service: Gynecology;  Laterality: Bilateral;  . LAPAROSCOPY  at age 75  . LUMBAR SPINE SURGERY    . ROBOTIC ASSISTED LAP VAGINAL HYSTERECTOMY N/A 10/09/2012   Procedure: ROBOTIC ASSISTED LAPAROSCOPIC VAGINAL HYSTERECTOMY;  Surgeon: Lahoma Crocker, MD;  Location: [facility];  Service: Gynecology;  Laterality: N/A;  . TUBAL  LIGATION    . UMBILICAL HERNIA REPAIR     as a child     OB History    Gravida  2   Para  1   Term  1   Preterm  0   AB  1   Living  1     SAB  1   TAB  0   Ectopic  0   Multiple  0   Live Births  1            Home Medications    Prior to Admission medications   Medication Sig Start Date End Date Taking? Authorizing Provider  albuterol (PROVENTIL HFA;VENTOLIN HFA) 108 (90 Base) MCG/ACT inhaler Inhale 2 puffs into the lungs every 4 (four) hours as needed for wheezing or shortness of breath (cough). 12/04/16   Maryellen Pile, MD  albuterol  (PROVENTIL) (2.5 MG/3ML) 0.083% nebulizer solution Inhale 2.5 mg into the lungs every 6 (six) hours as needed. 08/08/17   [provider]  budesonide-formoterol (SYMBICORT) 80-4.5 MCG/ACT inhaler Inhale 2 puffs into the lungs 2 (two) times daily. Patient taking differently: Inhale 2 puffs into the lungs as needed.  12/04/16 01/13/18  Maryellen Pile, MD  cyclobenzaprine (FLEXERIL) 5 MG tablet Take 5 mg by mouth every 6 (six) hours as needed. 09/01/17   [provider]  diclofenac (VOLTAREN) 75 MG EC tablet Take 1 tablet (75 mg total) by mouth 2 (two) times daily. Patient not taking: Reported on 10/13/2017 03/06/17   Vanessa Kick, MD  guaiFENesin-codeine 100-10 MG/5ML syrup Take 10 mLs by mouth every 6 (six) hours as needed for cough. Patient not taking: Reported on 10/13/2017 06/01/17   Veryl Speak, MD  meloxicam (MOBIC) 15 MG tablet Take 15 mg by mouth daily. 09/01/17   [provider]  terconazole (TERAZOL 3) 0.8 % vaginal cream Place 1 applicator vaginally at bedtime. 11/19/17   Shelly Bombard, MD    Family History Family History  Problem Relation Age of Onset  . Hypertension Mother   . Cancer Maternal Aunt   . Cancer Maternal Uncle   . Diabetes Maternal Grandmother   . Stroke Other        aunt  . Sickle cell anemia Other   . Anesthesia problems Neg Hx   . Hypotension Neg Hx   . Malignant hyperthermia Neg Hx   . Pseudochol deficiency Neg Hx     Social History Social History   Tobacco Use  . Smoking status: Former Smoker    Packs/day: 0.50    Types: Cigarettes, Cigars    Start date: 07/09/1995    Last attempt to quit: 11/20/2011    Years since quitting: 6.7  . Smokeless tobacco: Never Used  . Tobacco comment: pt quit cigarettes in 2009, pt smoke 2 black and milds a day until 09-2011  Substance Use Topics  . Alcohol use: Yes    Alcohol/week: 0.0 standard drinks    Comment: Occasionally, "maybe 1-2 times/month"  . Drug use: Yes    Frequency: 2.0 times per  week    Types: Marijuana     Allergies   Morphine and related; Aspirin; Contrast media [iodinated diagnostic agents]; Tramadol; and Vicodin [hydrocodone-acetaminophen]   Review of Systems Review of Systems  Constitutional: Negative for fever.  Respiratory: Positive for cough, shortness of breath and wheezing.   Cardiovascular: Negative for chest pain and leg swelling.  All other systems reviewed and are negative.    Physical Exam Updated Vital Signs BP 124/81  Pulse (!) 107   Temp 98.3 F (36.8 C) (Oral)   Resp (!) 21   LMP 05/01/2012   SpO2 100%   Physical Exam Vitals signs and nursing note reviewed.  Constitutional:      General: She is not in acute distress.    Appearance: She is well-developed.  HENT:     Head: Normocephalic and atraumatic.  Neck:     Musculoskeletal: Neck supple.  Cardiovascular:     Rate and Rhythm: Normal rate and regular rhythm.     Heart sounds: Normal heart sounds. No murmur.  Pulmonary:     Effort: No respiratory distress.     Comments: Tachypneic.  Wheezing in all lung fields.  Harsh lung sounds.  97 to 100% oxygenation on room air during exam.  Frequent coughing fits. Abdominal:     General: There is no distension.     Palpations: Abdomen is soft.     Tenderness: There is no abdominal tenderness.  Skin:    General: Skin is warm and dry.  Neurological:     Mental Status: She is alert and oriented to person, place, and time.      ED Treatments / Results  Labs (all labs ordered are listed, but only abnormal results are displayed) Labs Reviewed  BASIC METABOLIC PANEL - Abnormal; Notable for the following components:      Result Value   Potassium 3.0 (*)    Glucose, Bld 113 (*)    Calcium 8.8 (*)    All other components within normal limits  CBC WITH DIFFERENTIAL/PLATELET  I-STAT TROPONIN, ED    EKG EKG Interpretation  Date/Time:  Monday August 17 2018 17:07:30 EST Ventricular Rate:  104 PR Interval:    QRS  Duration: 62 QT Interval:  313 QTC Calculation: 412 R Axis:   63 Text Interpretation:  Sinus tachycardia Probable left atrial enlargement Borderline repolarization abnormality Confirmed by Quintella Reichert [phone]) on 08/17/2018 5:20:01 PM   Radiology Dg Chest 2 View  Result Date: 08/17/2018 CLINICAL DATA:  Shortness of breath and cough. EXAM: CHEST - 2 VIEW COMPARISON:  05/31/2017 FINDINGS: Cardiomediastinal silhouette is normal. Mediastinal contours appear intact. There is no evidence of focal airspace consolidation, pleural effusion or pneumothorax. Peribronchial thickening with lower lobe predominance. Osseous structures are without acute abnormality. Fusion at the cervicothoracic junction noted. Soft tissues are grossly normal. IMPRESSION: Peribronchial thickening with lower lobe predominance may represent acute bronchitis or early bronchopneumonia. Electronically Signed   By: Fidela Salisbury M.D.   On: 08/17/2018 18:15    Procedures Procedures (including critical care time)  Medications Ordered in ED Medications  ipratropium-albuterol (DUONEB) 0.5-2.5 (3) MG/3ML nebulizer solution 3 mL (3 mLs Nebulization Given 08/17/18 1724)  magnesium sulfate IVPB 2 g 50 mL (0 g Intravenous Stopped 08/17/18 1849)  cefTRIAXone (ROCEPHIN) 1 g in sodium chloride 0.9 % 100 mL IVPB (1 g Intravenous New Bag/Given 08/17/18 1859)  doxycycline (VIBRA-TABS) tablet 100 mg (100 mg Oral Given 08/17/18 1858)  benzonatate (TESSALON) capsule 100 mg (100 mg Oral Given 08/17/18 1858)     Initial Impression / Assessment and Plan / ED Course  I have reviewed the triage vital signs and the nursing notes.  Pertinent labs & imaging results that were available during my care of the patient were reviewed by me and considered in my medical decision making (see chart for details).    KIMIYAH BLICK is a 43 y.o. female who presents to ED for persistent cough and wheezing for several weeks,  however acutely worsened 5 days  ago.  Started on azithromycin by her PCP on February 6.  She has had no improvement after course of antibiotics, therefore saw her PCP again today.  She was transferred over to the emergency department for further work-up. received 2 nebs (1 in the clinic and another in route). She also received 125 mg of Solu-Medrol to arrival.  Given mag in ED today along with a third neb treatment.  On final evaluation, patient oxygenating well, but lung sounds are not much improved.  Chest x-ray with peribronchial thickening with lower lobe predominance acute bronchitis versus early bronchopneumonia.  Started on doxy and Rocephin.  Hospitalist consulted who will admit.   Final Clinical Impressions(s) / ED Diagnoses   Final diagnoses:  Shortness of breath  Community acquired pneumonia, unspecified laterality    ED Discharge Orders    None       Ward, Ozella Almond, PA-C 08/17/18 1943    Quintella Reichert, MD 08/18/18 [phone]

## 2018-08-17 NOTE — ED Notes (Signed)
Pt c/o "feeling hot liquid com ing from my butt." Pt then states she is going to faint. Pt reassured and bottom check. Pt is clean and respiration unlabored. Pt reclined and symptoms resolved. Will inform provider.

## 2018-08-17 NOTE — ED Triage Notes (Addendum)
In progress. Pt arrived via [facility] from home c/o sob and cough. Pt has been a previous dx of respiratory infection and has finished her abx. Cough has persisted. Non p

## 2018-08-17 NOTE — H&P (Signed)
History and Physical    Megan Chandler EGB:[id number] DOB: 1976/04/01 DOA: 08/17/2018  PCP: Vonna Drafts, FNP Patient coming from: Home  Chief Complaint: Shortness of breath, cough  HPI: Megan Chandler is a 43 y.o. female with medical history significant of anemia, anxiety, asthma, GERD, prior PE in 2013 presenting to the hospital via EMS for evaluation of shortness of breath and cough.  Patient reports a one-month history of shortness of breath, wheezing, and cough.  States symptoms have been worse for the past 1 week.  She ran out of her inhaler 5 days ago.  Reports having chills and fatigue.  Reports having chest pain sometimes only when coughing.  Reports having diarrhea, nausea, and vomiting for the past 1 week.  Denies having any abdominal pain.  States her PCP recently prescribed azithromycin and she finished a 5-day course 2 days ago with no improvement in her breathing.  Received Solu-Medrol 125 mg, Atrovent, and albuterol via EMS.  Review of Systems: As per HPI otherwise 10 point review of systems negative.  Past Medical History:  Diagnosis Date  . Adenomyosis   . Anemia   . Anxiety   . Asthma    daily inhaler use  . Bartholin's cyst    hx of  . Bronchitis   . Endometriosis   . GERD (gastroesophageal reflux disease)   . Menometrorrhagia   . Pulmonary embolism ([place])    11/2011/ states no since  anticoagulation 1/14  . Sciatica     Past Surgical History:  Procedure Laterality Date  . ABDOMINAL HYSTERECTOMY    . BACK SURGERY     07/2011  . BILATERAL SALPINGECTOMY Bilateral 10/09/2012   Procedure: BILATERAL SALPINGECTOMY;  Surgeon: Lahoma Crocker, MD;  Location: [facility];  Service: Gynecology;  Laterality: Bilateral;  . CERVICAL SPINE SURGERY  2009  . CESAREAN SECTION  1996  . CHOLECYSTECTOMY, LAPAROSCOPIC  1998  . DILATION AND CURETTAGE OF UTERUS    . HYSTEROSCOPY  04/02/2012   Procedure: HYSTEROSCOPY;  Surgeon: Lahoma Crocker, MD;  Location: [facility];   Service: Gynecology;  Laterality: N/A;  with Dilitation and curretage and attempted hydrothermal ablation  . LAPAROSCOPIC TUBAL LIGATION  02/12/2012   Procedure: LAPAROSCOPIC TUBAL LIGATION;  Surgeon: Lahoma Crocker, MD;  Location: [facility];  Service: Gynecology;  Laterality: Bilateral;  . LAPAROSCOPY  at age 46  . LUMBAR SPINE SURGERY    . ROBOTIC ASSISTED LAP VAGINAL HYSTERECTOMY N/A 10/09/2012   Procedure: ROBOTIC ASSISTED LAPAROSCOPIC VAGINAL HYSTERECTOMY;  Surgeon: Lahoma Crocker, MD;  Location: [facility];  Service: Gynecology;  Laterality: N/A;  . TUBAL LIGATION    . UMBILICAL HERNIA REPAIR     as a child     reports that she quit smoking about 6 years ago. Her smoking use included cigarettes and cigars. She started smoking about 23 years ago. She smoked 0.50 packs per day. She has never used smokeless tobacco. She reports current alcohol use. She reports current drug use. Frequency: 2.00 times per week. Drug: Marijuana.  Allergies  Allergen Reactions  . Morphine And Related Anaphylaxis    Pt can take dilaudid  . Aspirin Itching    Pt can tolerate ibuprofen  . Contrast Media [Iodinated Diagnostic Agents] Nausea And Vomiting  . Tramadol Itching  . Vicodin [Hydrocodone-Acetaminophen] Itching    Pt can take percocet    Family History  Problem Relation Age of Onset  . Hypertension Mother   . Cancer Maternal Aunt   . Cancer Maternal Uncle   .  Diabetes Maternal Grandmother   . Stroke Other        aunt  . Sickle cell anemia Other   . Anesthesia problems Neg Hx   . Hypotension Neg Hx   . Malignant hyperthermia Neg Hx   . Pseudochol deficiency Neg Hx     Prior to Admission medications   Medication Sig Start Date End Date Taking? Authorizing Provider  albuterol (PROVENTIL HFA;VENTOLIN HFA) 108 (90 Base) MCG/ACT inhaler Inhale 2 puffs into the lungs every 4 (four) hours as needed for wheezing or shortness of breath (cough). 12/04/16   Maryellen Pile, MD  albuterol (PROVENTIL)  (2.5 MG/3ML) 0.083% nebulizer solution Inhale 2.5 mg into the lungs every 6 (six) hours as needed. 08/08/17   [provider]  budesonide-formoterol (SYMBICORT) 80-4.5 MCG/ACT inhaler Inhale 2 puffs into the lungs 2 (two) times daily. Patient taking differently: Inhale 2 puffs into the lungs as needed.  12/04/16 01/13/18  Maryellen Pile, MD  cyclobenzaprine (FLEXERIL) 5 MG tablet Take 5 mg by mouth every 6 (six) hours as needed. 09/01/17   [provider]  diclofenac (VOLTAREN) 75 MG EC tablet Take 1 tablet (75 mg total) by mouth 2 (two) times daily. Patient not taking: Reported on 10/13/2017 03/06/17   Vanessa Kick, MD  guaiFENesin-codeine 100-10 MG/5ML syrup Take 10 mLs by mouth every 6 (six) hours as needed for cough. Patient not taking: Reported on 10/13/2017 06/01/17   Veryl Speak, MD  meloxicam (MOBIC) 15 MG tablet Take 15 mg by mouth daily. 09/01/17   [provider]  terconazole (TERAZOL 3) 0.8 % vaginal cream Place 1 applicator vaginally at bedtime. 11/19/17   Shelly Bombard, MD    Physical Exam: Vitals:   08/17/18 1730 08/17/18 1900 08/17/18 2352 08/18/18 0127  BP: 122/87 124/81 (!) 125/103   Pulse: 99 (!) 107 87   Resp: (!) 21  18   Temp:   98.3 F (36.8 C)   TempSrc:   Oral   SpO2: 100% 100% 100% 99%  Weight:   72.3 kg   Height:   5\' 6"  (1.676 m)     Physical Exam  Constitutional: She is oriented to person, place, and time. She appears well-developed and well-nourished.  HENT:  Head: Normocephalic.  Mouth/Throat: Oropharynx is clear and moist.  Eyes: Right eye exhibits no discharge. Left eye exhibits no discharge.  Neck: Neck supple.  Cardiovascular: Normal rate, regular rhythm and intact distal pulses.  Pulmonary/Chest: She has wheezes. She has no rales.  Speaking clearly in full sentences Satting well on room air Diffuse end expiratory wheezing Coughing Slightly increased work of breathing  Abdominal: Soft. Bowel sounds are normal. She  exhibits no distension. There is no abdominal tenderness. There is no rebound and no guarding.  Musculoskeletal:        General: No edema.  Neurological: She is alert and oriented to person, place, and time.  Skin: Skin is warm and dry. She is not diaphoretic.     Labs on Admission: I have personally reviewed following labs and imaging studies  CBC: Recent Labs  Lab 08/17/18 1732  WBC 8.3  NEUTROABS 3.9  HGB 14.1  HCT 43.3  MCV 92.9  PLT 599   Basic Metabolic Panel: Recent Labs  Lab 08/17/18 1732 08/17/18 2202  NA 143  --   K 3.0*  --   CL 107  --   CO2 25  --   GLUCOSE 113*  --   BUN 9  --  CREATININE 0.89  --   CALCIUM 8.8*  --   MG  --  2.4   GFR: Estimated Creatinine Clearance: 82.1 mL/min (by C-G formula based on SCr of 0.89 mg/dL). Liver Function Tests: Recent Labs  Lab 08/17/18 2202  AST 24  ALT 22  ALKPHOS 76  BILITOT 0.6  PROT 6.6  ALBUMIN 3.6   Recent Labs  Lab 08/17/18 2202  LIPASE 24   No results for input(s): AMMONIA in the last 168 hours. Coagulation Profile: No results for input(s): INR, PROTIME in the last 168 hours. Cardiac Enzymes: No results for input(s): CKTOTAL, CKMB, CKMBINDEX, TROPONINI in the last 168 hours. BNP (last 3 results) No results for input(s): PROBNP in the last 8760 hours. HbA1C: No results for input(s): HGBA1C in the last 72 hours. CBG: No results for input(s): GLUCAP in the last 168 hours. Lipid Profile: No results for input(s): CHOL, HDL, LDLCALC, TRIG, CHOLHDL, LDLDIRECT in the last 72 hours. Thyroid Function Tests: No results for input(s): TSH, T4TOTAL, FREET4, T3FREE, THYROIDAB in the last 72 hours. Anemia Panel: No results for input(s): VITAMINB12, FOLATE, FERRITIN, TIBC, IRON, RETICCTPCT in the last 72 hours. Urine analysis:    Component Value Date/Time   COLORURINE YELLOW 10/13/2017 1915   APPEARANCEUR HAZY (A) 10/13/2017 1915   LABSPEC 1.016 10/13/2017 1915   PHURINE 6.0 10/13/2017 1915    GLUCOSEU NEGATIVE 10/13/2017 1915   HGBUR SMALL (A) 10/13/2017 1915   BILIRUBINUR NEGATIVE 10/13/2017 1915   BILIRUBINUR Neg 02/12/2016 1159   [place] 10/13/2017 1915   PROTEINUR NEGATIVE 10/13/2017 1915   UROBILINOGEN 0.2 02/12/2016 1159   UROBILINOGEN 0.2 07/16/2011 1408   NITRITE NEGATIVE 10/13/2017 1915   LEUKOCYTESUR NEGATIVE 10/13/2017 1915    Radiological Exams on Admission: Dg Chest 2 View  Result Date: 08/17/2018 CLINICAL DATA:  Shortness of breath and cough. EXAM: CHEST - 2 VIEW COMPARISON:  05/31/2017 FINDINGS: Cardiomediastinal silhouette is normal. Mediastinal contours appear intact. There is no evidence of focal airspace consolidation, pleural effusion or pneumothorax. Peribronchial thickening with lower lobe predominance. Osseous structures are without acute abnormality. Fusion at the cervicothoracic junction noted. Soft tissues are grossly normal. IMPRESSION: Peribronchial thickening with lower lobe predominance may represent acute bronchitis or early bronchopneumonia. Electronically Signed   By: Fidela Salisbury M.D.   On: 08/17/2018 18:15    EKG: Independently reviewed.  Sinus tachycardia (heart rate 104), questionable ST depressions in inferior and lateral leads similar to prior tracing from April 2019.  Assessment/Plan Principal Problem:   Asthma exacerbation Active Problems:   CAP (community acquired pneumonia)   Viral gastroenteritis   Hypokalemia   Respiratory distress secondary to acute asthma exacerbation and possible CAP -Slightly tachycardic and tachypneic.  Not hypoxic.  Wheezing on exam. -Chest x-ray showing peribronchial thickening with lower lobe predominance which may represent acute bronchitis or early bronchopneumonia. -Recently treated with azithromycin on an outpatient basis with no improvement in her symptoms. -Afebrile and no leukocytosis.  -Continue ceftriaxone and doxycycline for possible pneumonia. Check procalcitonin level, if  normal, discontinue antibiotics. -Scheduled duo nebs -Albuterol every 2 hours as needed  -Received IV magnesium 2 g in the ED -Solu-Medrol 125 mg given by EMS.  Continue Solu-Medrol 60 mg every 8 hours starting early morning. -Mucinex DM scheduled, Tessalon Perles as needed -Continuous pulse ox  Viral gastroenteritis Patient reports a one-week history of nausea, vomiting, and diarrhea.  Lipase and LFTs normal.  Afebrile and no leukocytosis.  No episodes of vomiting or diarrhea noted in the hospital.  Abdominal exam benign. -IV fluid hydration -Zofran PRN -Patient was recently treated with an antibiotic on an outpatient basis.  Check C. difficile panel if she is noted to have diarrhea in the hospital.  Hypokalemia -Potassium 3.0.  Magnesium level normal. -Replete potassium.  Continue to monitor BMP.  DVT prophylaxis: Lovenox Code Status: Full code Family Communication: No family available. Disposition Plan: Anticipate discharge after clinical improvement. Consults called: None Admission status: Observation, telemetry   Shela Leff MD [facility] Pager [phone]  If 7PM-7AM, please contact night-coverage [email or web address] Password [email or web address]  08/18/2018, 1:35 AM

## 2018-08-17 NOTE — ED Notes (Signed)
Patient given turkey sandwich and PO fluids.  

## 2018-08-18 ENCOUNTER — Other Ambulatory Visit: Payer: Self-pay

## 2018-08-18 ENCOUNTER — Encounter (HOSPITAL_COMMUNITY): Payer: Self-pay | Admitting: Emergency Medicine

## 2018-08-18 DIAGNOSIS — K219 Gastro-esophageal reflux disease without esophagitis: Secondary | ICD-10-CM | POA: Diagnosis present

## 2018-08-18 DIAGNOSIS — Z90710 Acquired absence of both cervix and uterus: Secondary | ICD-10-CM | POA: Diagnosis not present

## 2018-08-18 DIAGNOSIS — Z87891 Personal history of nicotine dependence: Secondary | ICD-10-CM | POA: Diagnosis not present

## 2018-08-18 DIAGNOSIS — F1290 Cannabis use, unspecified, uncomplicated: Secondary | ICD-10-CM | POA: Diagnosis present

## 2018-08-18 DIAGNOSIS — A084 Viral intestinal infection, unspecified: Secondary | ICD-10-CM

## 2018-08-18 DIAGNOSIS — E876 Hypokalemia: Secondary | ICD-10-CM | POA: Diagnosis not present

## 2018-08-18 DIAGNOSIS — Z885 Allergy status to narcotic agent status: Secondary | ICD-10-CM | POA: Diagnosis not present

## 2018-08-18 DIAGNOSIS — Z86711 Personal history of pulmonary embolism: Secondary | ICD-10-CM | POA: Diagnosis not present

## 2018-08-18 DIAGNOSIS — Z886 Allergy status to analgesic agent status: Secondary | ICD-10-CM | POA: Diagnosis not present

## 2018-08-18 DIAGNOSIS — Z79891 Long term (current) use of opiate analgesic: Secondary | ICD-10-CM | POA: Diagnosis not present

## 2018-08-18 DIAGNOSIS — Z833 Family history of diabetes mellitus: Secondary | ICD-10-CM | POA: Diagnosis not present

## 2018-08-18 DIAGNOSIS — R0602 Shortness of breath: Secondary | ICD-10-CM | POA: Diagnosis not present

## 2018-08-18 DIAGNOSIS — J45901 Unspecified asthma with (acute) exacerbation: Secondary | ICD-10-CM | POA: Diagnosis not present

## 2018-08-18 DIAGNOSIS — J189 Pneumonia, unspecified organism: Secondary | ICD-10-CM

## 2018-08-18 DIAGNOSIS — R0603 Acute respiratory distress: Secondary | ICD-10-CM | POA: Diagnosis present

## 2018-08-18 DIAGNOSIS — Z823 Family history of stroke: Secondary | ICD-10-CM | POA: Diagnosis not present

## 2018-08-18 DIAGNOSIS — F419 Anxiety disorder, unspecified: Secondary | ICD-10-CM | POA: Diagnosis present

## 2018-08-18 DIAGNOSIS — Z791 Long term (current) use of non-steroidal anti-inflammatories (NSAID): Secondary | ICD-10-CM | POA: Diagnosis not present

## 2018-08-18 DIAGNOSIS — Z7951 Long term (current) use of inhaled steroids: Secondary | ICD-10-CM | POA: Diagnosis not present

## 2018-08-18 DIAGNOSIS — Z832 Family history of diseases of the blood and blood-forming organs and certain disorders involving the immune mechanism: Secondary | ICD-10-CM | POA: Diagnosis not present

## 2018-08-18 DIAGNOSIS — Z8249 Family history of ischemic heart disease and other diseases of the circulatory system: Secondary | ICD-10-CM | POA: Diagnosis not present

## 2018-08-18 DIAGNOSIS — Z79899 Other long term (current) drug therapy: Secondary | ICD-10-CM | POA: Diagnosis not present

## 2018-08-18 DIAGNOSIS — Z91041 Radiographic dye allergy status: Secondary | ICD-10-CM | POA: Diagnosis not present

## 2018-08-18 HISTORY — DX: Pneumonia, unspecified organism: J18.9

## 2018-08-18 LAB — BASIC METABOLIC PANEL
Anion gap: 11 (ref 5–15)
BUN: 7 mg/dL (ref 6–20)
CO2: 21 mmol/L — ABNORMAL LOW (ref 22–32)
Calcium: 8.8 mg/dL — ABNORMAL LOW (ref 8.9–10.3)
Chloride: 109 mmol/L (ref 98–111)
Creatinine, Ser: 0.7 mg/dL (ref 0.44–1.00)
GFR calc Af Amer: >60 mL/min (ref >60)
GFR calc non Af Amer: >60 mL/min (ref >60)
Glucose, Bld: 134 mg/dL — ABNORMAL HIGH (ref 70–99)
Potassium: 4.1 mmol/L (ref 3.5–5.1)
SODIUM: 141 mmol/L (ref 135–145)

## 2018-08-18 LAB — RAPID URINE DRUG SCREEN, HOSP PERFORMED
Amphetamines: NOT DETECTED
Barbiturates: POSITIVE — AB
Benzodiazepines: NOT DETECTED
Cocaine: NOT DETECTED
Opiates: NOT DETECTED
Tetrahydrocannabinol: POSITIVE — AB

## 2018-08-18 LAB — D-DIMER, QUANTITATIVE: D-Dimer, Quant: 0.31 ug/mL-FEU (ref 0.00–0.50)

## 2018-08-18 LAB — HIV ANTIBODY (ROUTINE TESTING W REFLEX): HIV Screen 4th Generation wRfx: NONREACTIVE

## 2018-08-18 LAB — PROCALCITONIN

## 2018-08-18 MED ORDER — ACETAMINOPHEN 325 MG PO TABS
650.0000 mg | ORAL_TABLET | Freq: Four times a day (QID) | ORAL | Status: DC | PRN
  Administered 2018-08-18 (×3): 650 mg via ORAL
  Filled 2018-08-18 (×3): qty 2

## 2018-08-18 MED ORDER — IPRATROPIUM-ALBUTEROL 0.5-2.5 (3) MG/3ML IN SOLN
3.0000 mL | Freq: Three times a day (TID) | RESPIRATORY_TRACT | Status: DC
  Administered 2018-08-18 – 2018-08-19 (×4): 3 mL via RESPIRATORY_TRACT
  Filled 2018-08-18 (×4): qty 3

## 2018-08-18 MED ORDER — PREDNISONE 20 MG PO TABS
40.0000 mg | ORAL_TABLET | Freq: Every day | ORAL | Status: DC
  Administered 2018-08-19: 40 mg via ORAL
  Filled 2018-08-18: qty 2

## 2018-08-18 MED ORDER — ALPRAZOLAM 0.5 MG PO TABS
0.5000 mg | ORAL_TABLET | Freq: Every evening | ORAL | Status: DC | PRN
  Administered 2018-08-18: 0.5 mg via ORAL
  Filled 2018-08-18: qty 1

## 2018-08-18 MED ORDER — BUTALBITAL-APAP-CAFFEINE 50-325-40 MG PO TABS
1.0000 | ORAL_TABLET | ORAL | Status: AC
  Administered 2018-08-18: 1 via ORAL
  Filled 2018-08-18: qty 1

## 2018-08-18 MED ORDER — BUTALBITAL-APAP-CAFFEINE 50-325-40 MG PO TABS: 1.0000 | ORAL_TABLET | Freq: Four times a day (QID) | ORAL | Status: DC | PRN

## 2018-08-18 NOTE — Progress Notes (Addendum)
Progress Note    Megan Chandler  ELF:[id number] DOB: 1976/05/05  DOA: 08/17/2018 PCP: Vonna Drafts, FNP    Brief Narrative:   Chief complaint: Shortness of breath  Medical records reviewed and are as summarized below:  Megan Chandler is an 43 y.o. female with past medical history significant for asthma, anemia, anxiety, GERD, and PE in 2013; who presented with shortness of breath and cough.  Assessment/Plan:   Principal Problem:   Asthma exacerbation Active Problems:   CAP (community acquired pneumonia)   Viral gastroenteritis   Hypokalemia  Asthma exacerbation with respiratory distress, Community-acquired pneumonia: Acute.  Patient still significantly wheezing. She is afebrile and white blood cell count within normal limits. Chest x-ray showing concern for possible bronchopneumonia after completing antibiotics of azithromycin by PCP.   Patient has been given 125 mg of Solu-Medrol IV and received 1 dose of Rocephin in the emergency department, and then started on doxycycline p.o.  Question continuation of antibiotics at this time given procalcitonin <0.1. -Continue nasal cannula oxygen as needed and wean to room air -Check UDS  -Check d-dimer given history of PE -Continue doxycycline -DuoNebs 3 times daily -Continue Solu-Medrol IV 60 mg every 6 hours x2 more doses, and start prednisone 40 mg in a.m -Continue Mucinex  Headache: Acute.  Patient reports no relief of headache symptoms with Tylenol. -Fioricet x1 dose  Hypokalemia: Replaced.  Potassium improved with potassium chloride supplementation from 3 ->4.1 today.  Body mass index is 25.73 kg/m.   Family Communication/Anticipated D/C date and plan/Code Status   DVT prophylaxis: Lovenox ordered. Code Status: Full Code.  Family Communication: No family present at bedside Disposition Plan: Hopefully discharge home tomorrow morning   Medical Consultants:    None.   Anti-Infectives:    Doxycycline  day 2  Subjective:   Patient reports that her breathing is not any better and she has a headache.  Objective:    Vitals:   08/18/18 0624 08/18/18 1127 08/18/18 1250 08/18/18 1617  BP: 126/89 128/84  (!) 138/94  Pulse: 83 88  73  Resp: 18 18  18   Temp: 98.1 F (36.7 C) 98.8 F (37.1 C)  98.2 F (36.8 C)  TempSrc: Oral Oral  Oral  SpO2: 100% 98% 99% 99%  Weight:      Height:        Intake/Output Summary (Last 24 hours) at 08/18/2018 1813 Last data filed at 08/18/2018 0746 Gross per 24 hour  Intake 1060 ml  Output -  Net 1060 ml   Filed Weights   08/17/18 2352  Weight: 72.3 kg    Exam: Constitutional: Middle-aged female who appears to be distressed Eyes: PERRL, lids and conjunctivae normal ENMT: Mucous membranes are moist. Posterior pharynx clear of any exudate or lesions.Normal dentition.  Neck: normal, supple, no masses, no thyromegaly Respiratory: Diffuse bilateral wheezing noted.  Normal respiratory effort.  Patient was initially on room air and then put back on her nasal cannula oxygen at 2 L.  Cardiovascular: Regular rate and rhythm, no murmurs / rubs / gallops. No extremity edema. 2+ pedal pulses. No carotid bruits.  Abdomen: no tenderness, no masses palpated. No hepatosplenomegaly. Bowel sounds positive.  Musculoskeletal: no clubbing / cyanosis. No joint deformity upper and lower extremities. Good ROM, no contractures. Normal muscle tone.  Skin: no rashes, lesions, ulcers. No induration Neurologic: CN 2-12 grossly intact. Sensation intact, DTR normal. Strength 5/5 in all 4.  Psychiatric: Normal judgment and insight. Alert and oriented x  3.  Anxious mood.    Data Reviewed:   I have personally reviewed following labs and imaging studies:  Labs: Labs show the following:   Basic Metabolic Panel: Recent Labs  Lab 08/17/18 1732 08/17/18 2202 08/18/18 0602  NA 143  --  141  K 3.0*  --  4.1  CL 107  --  109  CO2 25  --  21*  GLUCOSE 113*  --  134*  BUN 9   --  7  CREATININE 0.89  --  0.70  CALCIUM 8.8*  --  8.8*  MG  --  2.4  --    GFR Estimated Creatinine Clearance: 91.4 mL/min (by C-G formula based on SCr of 0.7 mg/dL). Liver Function Tests: Recent Labs  Lab 08/17/18 2202  AST 24  ALT 22  ALKPHOS 76  BILITOT 0.6  PROT 6.6  ALBUMIN 3.6   Recent Labs  Lab 08/17/18 2202  LIPASE 24   No results for input(s): AMMONIA in the last 168 hours. Coagulation profile No results for input(s): INR, PROTIME in the last 168 hours.  CBC: Recent Labs  Lab 08/17/18 1732  WBC 8.3  NEUTROABS 3.9  HGB 14.1  HCT 43.3  MCV 92.9  PLT 254   Cardiac Enzymes: No results for input(s): CKTOTAL, CKMB, CKMBINDEX, TROPONINI in the last 168 hours. BNP (last 3 results) No results for input(s): PROBNP in the last 8760 hours. CBG: No results for input(s): GLUCAP in the last 168 hours. D-Dimer: No results for input(s): DDIMER in the last 72 hours. Hgb A1c: No results for input(s): HGBA1C in the last 72 hours. Lipid Profile: No results for input(s): CHOL, HDL, LDLCALC, TRIG, CHOLHDL, LDLDIRECT in the last 72 hours. Thyroid function studies: No results for input(s): TSH, T4TOTAL, T3FREE, THYROIDAB in the last 72 hours.  Invalid input(s): FREET3 Anemia work up: No results for input(s): VITAMINB12, FOLATE, FERRITIN, TIBC, IRON, RETICCTPCT in the last 72 hours. Sepsis Labs: Recent Labs  Lab 08/17/18 1732 08/18/18 0602  PROCALCITON  --  <0.10  WBC 8.3  --     Microbiology No results found for this or any previous visit (from the past 240 hour(s)).  Procedures and diagnostic studies:  Dg Chest 2 View  Result Date: 08/17/2018 CLINICAL DATA:  Shortness of breath and cough. EXAM: CHEST - 2 VIEW COMPARISON:  05/31/2017 FINDINGS: Cardiomediastinal silhouette is normal. Mediastinal contours appear intact. There is no evidence of focal airspace consolidation, pleural effusion or pneumothorax. Peribronchial thickening with lower lobe predominance.  Osseous structures are without acute abnormality. Fusion at the cervicothoracic junction noted. Soft tissues are grossly normal. IMPRESSION: Peribronchial thickening with lower lobe predominance may represent acute bronchitis or early bronchopneumonia. Electronically Signed   By: Fidela Salisbury M.D.   On: 08/17/2018 18:15    Medications:   . dextromethorphan-guaiFENesin  2 tablet Oral BID  . doxycycline  100 mg Oral Q12H  . enoxaparin (LOVENOX) injection  40 mg Subcutaneous Q24H  . ipratropium-albuterol  3 mL Nebulization TID  . methylPREDNISolone (SOLU-MEDROL) injection  60 mg Intravenous Q8H   Continuous Infusions: . cefTRIAXone (ROCEPHIN)  IV       LOS: 0 days   Cong Hightower A Shavone Nevers  [facility]   *Please refer to [email or web address], password [email or web address] to get updated schedule on who will round on this patient, as hospitalists switch teams weekly. If 7PM-7AM, please contact night-coverage at [email or web address], password [email or web address] for any overnight needs.

## 2018-08-19 LAB — BASIC METABOLIC PANEL
Anion gap: 9 (ref 5–15)
BUN: 9 mg/dL (ref 6–20)
CO2: 24 mmol/L (ref 22–32)
Calcium: 9.2 mg/dL (ref 8.9–10.3)
Chloride: 107 mmol/L (ref 98–111)
Creatinine, Ser: 0.68 mg/dL (ref 0.44–1.00)
GFR calc Af Amer: >60 mL/min (ref >60)
GFR calc non Af Amer: >60 mL/min (ref >60)
Glucose, Bld: 118 mg/dL — ABNORMAL HIGH (ref 70–99)
Potassium: 4.3 mmol/L (ref 3.5–5.1)
SODIUM: 140 mmol/L (ref 135–145)

## 2018-08-19 MED ORDER — ALBUTEROL SULFATE HFA 108 (90 BASE) MCG/ACT IN AERS: 2.0000 | INHALATION_SPRAY | Freq: Four times a day (QID) | RESPIRATORY_TRACT | 0 refills | Status: DC | PRN

## 2018-08-19 MED ORDER — HYDROXYZINE HCL 25 MG PO TABS: 25.0000 mg | ORAL_TABLET | Freq: Four times a day (QID) | ORAL | 0 refills | Status: DC | PRN

## 2018-08-19 MED ORDER — PREDNISONE 20 MG PO TABS: 40.0000 mg | ORAL_TABLET | Freq: Every day | ORAL | 0 refills | Status: DC

## 2018-08-19 MED ORDER — DM-GUAIFENESIN ER 30-600 MG PO TB12: 2.0000 | ORAL_TABLET | Freq: Two times a day (BID) | ORAL | 0 refills | Status: DC | PRN

## 2018-08-19 MED ORDER — ALBUTEROL SULFATE (2.5 MG/3ML) 0.083% IN NEBU: 2.5000 mg | INHALATION_SOLUTION | RESPIRATORY_TRACT | 0 refills | Status: DC | PRN

## 2018-08-19 NOTE — Plan of Care (Signed)
°  Problem: Education: °Goal: Knowledge of General Education information will improve °Description: Including pain rating scale, medication(s)/side effects and non-pharmacologic comfort measures °Outcome: Progressing °  °Problem: Health Behavior/Discharge Planning: °Goal: Ability to manage health-related needs will improve °Outcome: Progressing °  °Problem: Clinical Measurements: °Goal: Ability to maintain clinical measurements within normal limits will improve °Outcome: Progressing °Goal: Will remain free from infection °Outcome: Progressing °Goal: Diagnostic test results will improve °Outcome: Progressing °Goal: Respiratory complications will improve °Outcome: Progressing °Goal: Cardiovascular complication will be avoided °Outcome: Progressing °  °Problem: Safety: °Goal: Ability to remain free from injury will improve °Outcome: Progressing °  °Problem: Skin Integrity: °Goal: Risk for impaired skin integrity will decrease °Outcome: Progressing °  °

## 2018-08-19 NOTE — Progress Notes (Signed)
Discharge instructions (including medications) discussed with and copy provided to patient/caregiver 

## 2018-08-19 NOTE — Discharge Instructions (Signed)
Community-Acquired Pneumonia, Adult °Pneumonia is an infection of the lungs. There are different types of pneumonia. One type can develop while a person is in a hospital. A different type, called community-acquired pneumonia, develops in people who are not, or have not recently been, in the hospital or other health care facility. °What are the causes? ° °Pneumonia may be caused by bacteria, viruses, or funguses. Community-acquired pneumonia is often caused by Streptococcus pneumonia bacteria. These bacteria are often passed from one person to another by breathing in droplets from the cough or sneeze of an infected person. °What increases the risk? °The condition is more likely to develop in: °· People who have chronic diseases, such as chronic obstructive pulmonary disease (COPD), asthma, congestive heart failure, cystic fibrosis, diabetes, or kidney disease. °· People who have early-stage or late-stage HIV. °· People who have sickle cell disease. °· People who have had their spleen removed (splenectomy). °· People who have poor dental hygiene. °· People who have medical conditions that increase the risk of breathing in (aspirating) secretions their own mouth and nose. °· People who have a weakened immune system (immunocompromised). °· People who smoke. °· People who travel to areas where pneumonia-causing germs commonly exist. °· People who are around animal habitats or animals that have pneumonia-causing germs, including birds, bats, rabbits, cats, and farm animals. °What are the signs or symptoms? °Symptoms of this condition include: °· A dry cough. °· A wet (productive) cough. °· Fever. °· Sweating. °· Chest pain, especially when breathing deeply or coughing. °· Rapid breathing or difficulty breathing. °· Shortness of breath. °· Shaking chills. °· Fatigue. °· Muscle aches. °How is this diagnosed? °Your health care provider will take a medical history and perform a physical exam. You may also have other tests,  including: °· Imaging studies of your chest, including X-rays. °· Tests to check your blood oxygen level and other blood gases. °· Other tests on blood, mucus (sputum), fluid around your lungs (pleural fluid), and urine. °If your pneumonia is severe, other tests may be done to identify the specific cause of your illness. °How is this treated? °The type of treatment that you receive depends on many factors, such as the cause of your pneumonia, the medicines you take, and other medical conditions that you have. For most adults, treatment and recovery from pneumonia may occur at home. In some cases, treatment must happen in a hospital. Treatment may include: °· Antibiotic medicines, if the pneumonia was caused by bacteria. °· Antiviral medicines, if the pneumonia was caused by a virus. °· Medicines that are given by mouth or through an IV tube. °· Oxygen. °· Respiratory therapy. °Although rare, treating severe pneumonia may include: °· Mechanical ventilation. This is done if you are not breathing well on your own and you cannot maintain a safe blood oxygen level. °· Thoracentesis. This procedure removes fluid around one lung or both lungs to help you breathe better. °Follow these instructions at home: ° °· Take over-the-counter and prescription medicines only as told by your health care provider. °? Only take cough medicine if you are losing sleep. Understand that cough medicine can prevent your body’s natural ability to remove mucus from your lungs. °? If you were prescribed an antibiotic medicine, take it as told by your health care provider. Do not stop taking the antibiotic even if you start to feel better. °· Sleep in a semi-upright position at night. Try sleeping in a reclining chair, or place a few pillows under your head. °· Do not use tobacco products, including cigarettes, chewing tobacco, and e-cigarettes.   If you need help quitting, ask your health care provider. °· Drink enough water to keep your urine  clear or pale yellow. This will help to thin out mucus secretions in your lungs. °How is this prevented? °There are ways that you can decrease your risk of developing community-acquired pneumonia. Consider getting a pneumococcal vaccine if: °· You are older than 45 years of age. °· You are older than 45 years of age and are undergoing cancer treatment, have chronic lung disease, or have other medical conditions that affect your immune system. Ask your health care provider if this applies to you. °There are different types and schedules of pneumococcal vaccines. Ask your health care provider which vaccination option is best for you. °You may also prevent community-acquired pneumonia if you take these actions: °· Get an influenza vaccine every year. Ask your health care provider which type of influenza vaccine is best for you. °· Go to the dentist on a regular basis. °· Wash your hands often. Use hand sanitizer if soap and water are not available. °Contact a health care provider if: °· You have a fever. °· You are losing sleep because you cannot control your cough with cough medicine. °Get help right away if: °· You have worsening shortness of breath. °· You have increased chest pain. °· Your sickness becomes worse, especially if you are an older adult or have a weakened immune system. °· You cough up blood. °This information is not intended to replace advice given to you by your health care provider. Make sure you discuss any questions you have with your health care provider. °Document Released: 06/24/2005 Document Revised: 03/13/2017 Document Reviewed: 10/19/2014 °Elsevier Interactive Patient Education © 2019 [employer] ° ° °Community-Acquired Pneumonia, Adult °Pneumonia is an infection of the lungs. It causes swelling in the airways of the lungs. Mucus and fluid may also build up inside the airways. °One type of pneumonia can happen while a person is in a hospital. A different type can happen when a person is  not in a hospital (community-acquired pneumonia).  °What are the causes? ° °This condition is caused by germs (viruses, bacteria, or fungi). Some types of germs can be passed from one person to another. This can happen when you breathe in droplets from the cough or sneeze of an infected person. °What increases the risk? °You are more likely to develop this condition if you: °· Have a long-term (chronic) disease, such as: °? Chronic obstructive pulmonary disease (COPD). °? Asthma. °? Cystic fibrosis. °? Congestive heart failure. °? Diabetes. °? Kidney disease. °· Have HIV. °· Have sickle cell disease. °· Have had your spleen removed. °· Do not take good care of your teeth and mouth (poor dental hygiene). °· Have a medical condition that increases the risk of breathing in droplets from your own mouth and nose. °· Have a weakened body defense system (immune system). °· Are a smoker. °· Travel to areas where the germs that cause this illness are common. °· Are around certain animals or the places they live. °What are the signs or symptoms? °· A dry cough. °· A wet (productive) cough. °· Fever. °· Sweating. °· Chest pain. This often happens when breathing deeply or coughing. °· Fast breathing or trouble breathing. °· Shortness of breath. °· Shaking chills. °· Feeling tired (fatigue). °· Muscle aches. °How is this treated? °Treatment for this condition depends on many things. Most adults can be treated at home. In some cases, treatment must happen in a   hospital. Treatment may include: °· Medicines given by mouth or through an IV tube. °· Being given extra oxygen. °· Respiratory therapy. °In rare cases, treatment for very bad pneumonia may include: °· Using a machine to help you breathe. °· Having a procedure to remove fluid from around your lungs. °Follow these instructions at home: °Medicines °· Take over-the-counter and prescription medicines only as told by your doctor. °? Only take cough medicine if you are losing  sleep. °· If you were prescribed an antibiotic medicine, take it as told by your doctor. Do not stop taking the antibiotic even if you start to feel better. °General instructions ° °· Sleep with your head and neck raised (elevated). You can do this by sleeping in a recliner or by putting a few pillows under your head. °· Rest as needed. Get at least 8 hours of sleep each night. °· Drink enough water to keep your pee (urine) pale yellow. °· Eat a healthy diet that includes plenty of vegetables, fruits, whole grains, low-fat dairy products, and lean protein. °· Do not use any products that contain nicotine or tobacco. These include cigarettes, e-cigarettes, and chewing tobacco. If you need help quitting, ask your doctor. °· Keep all follow-up visits as told by your doctor. This is important. °How is this prevented? °A shot (vaccine) can help prevent pneumonia. Shots are often suggested for: °· People older than 45 years of age. °· People older than 45 years of age who: °? Are having cancer treatment. °? Have long-term (chronic) lung disease. °? Have problems with their body's defense system. °You may also prevent pneumonia if you take these actions: °· Get the flu (influenza) shot every year. °· Go to the dentist as often as told. °· Wash your hands often. If you cannot use soap and water, use hand sanitizer. °Contact a doctor if: °· You have a fever. °· You lose sleep because your cough medicine does not help. °Get help right away if: °· You are short of breath and it gets worse. °· You have more chest pain. °· Your sickness gets worse. This is very serious if: °? You are an older adult. °? Your body's defense system is weak. °· You cough up blood. °Summary °· Pneumonia is an infection of the lungs. °· Most adults can be treated at home. Some will need treatment in a hospital. °· Drink enough water to keep your pee pale yellow. °· Get at least 8 hours of sleep each night. °This information is not intended to replace  advice given to you by your health care provider. Make sure you discuss any questions you have with your health care provider. °Document Released: 12/11/2007 Document Revised: 02/19/2018 Document Reviewed: 02/19/2018 °Elsevier Interactive Patient Education © 2019 [employer] ° °
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
## 2018-09-09 ENCOUNTER — Ambulatory Visit: Payer: Medicaid Other | Admitting: Obstetrics

## 2018-09-24 ENCOUNTER — Ambulatory Visit: Payer: Medicaid Other | Admitting: Obstetrics

## 2018-12-10 ENCOUNTER — Ambulatory Visit: Payer: Medicaid Other | Admitting: Certified Nurse Midwife

## 2019-06-25 ENCOUNTER — Other Ambulatory Visit: Payer: Self-pay | Admitting: Nurse Practitioner

## 2019-06-25 DIAGNOSIS — Z1231 Encounter for screening mammogram for malignant neoplasm of breast: Secondary | ICD-10-CM

## 2020-02-07 ENCOUNTER — Other Ambulatory Visit: Payer: Self-pay

## 2020-02-07 ENCOUNTER — Encounter: Payer: Self-pay | Admitting: Obstetrics

## 2020-02-07 ENCOUNTER — Ambulatory Visit: Payer: Medicaid Other | Admitting: Obstetrics

## 2020-02-07 ENCOUNTER — Other Ambulatory Visit (HOSPITAL_COMMUNITY)
Admission: RE | Admit: 2020-02-07 | Discharge: 2020-02-07 | Disposition: A | Discharge status: Home / Self Care | Payer: Medicaid Other | Source: Ambulatory Visit | Attending: Obstetrics | Admitting: Obstetrics

## 2020-02-07 VITALS — BP 116/72 | HR 89 | Ht 66.0 in | Wt 159.0 lb

## 2020-02-07 DIAGNOSIS — N760 Acute vaginitis: Secondary | ICD-10-CM | POA: Diagnosis not present

## 2020-02-07 DIAGNOSIS — B9689 Other specified bacterial agents as the cause of diseases classified elsewhere: Secondary | ICD-10-CM | POA: Diagnosis not present

## 2020-02-07 DIAGNOSIS — N898 Other specified noninflammatory disorders of vagina: Secondary | ICD-10-CM | POA: Diagnosis present

## 2020-02-07 DIAGNOSIS — B3731 Acute candidiasis of vulva and vagina: Secondary | ICD-10-CM

## 2020-02-07 DIAGNOSIS — B373 Candidiasis of vulva and vagina: Secondary | ICD-10-CM

## 2020-02-07 MED ORDER — METRONIDAZOLE 500 MG PO TABS: 500.0000 mg | ORAL_TABLET | Freq: Two times a day (BID) | ORAL | 2 refills | Status: DC

## 2020-02-07 MED ORDER — FLUCONAZOLE 150 MG PO TABS: 150.0000 mg | ORAL_TABLET | Freq: Once | ORAL | 0 refills | Status: AC

## 2020-02-07 NOTE — Progress Notes (Signed)
Patient ID: Megan Chandler, female   DOB: 1976-04-25, 44 y.o.   MRN: [id number]  Chief Complaint  Patient presents with  . GYN    HPI Megan Chandler is a 44 y.o. female.  Complains of vaginal itching, irritation and fishy odor. HPI  Past Medical History:  Diagnosis Date  . Adenomyosis   . Anemia   . Anxiety   . Asthma    daily inhaler use  . Bartholin's cyst    hx of  . Bronchitis   . Endometriosis   . GERD (gastroesophageal reflux disease)   . Menometrorrhagia   . Pneumonia 08/18/2018  . Pulmonary embolism ([place])    11/2011/ states no since  anticoagulation 1/14  . Sciatica     Past Surgical History:  Procedure Laterality Date  . ABDOMINAL HYSTERECTOMY    . BACK SURGERY     07/2011  . BILATERAL SALPINGECTOMY Bilateral 10/09/2012   Procedure: BILATERAL SALPINGECTOMY;  Surgeon: Lahoma Crocker, MD;  Location: [facility];  Service: Gynecology;  Laterality: Bilateral;  . CERVICAL SPINE SURGERY  2009  . CESAREAN SECTION  1996  . CHOLECYSTECTOMY, LAPAROSCOPIC  1998  . DILATION AND CURETTAGE OF UTERUS    . HYSTEROSCOPY  04/02/2012   Procedure: HYSTEROSCOPY;  Surgeon: Lahoma Crocker, MD;  Location: [facility];  Service: Gynecology;  Laterality: N/A;  with Dilitation and curretage and attempted hydrothermal ablation  . LAPAROSCOPIC TUBAL LIGATION  02/12/2012   Procedure: LAPAROSCOPIC TUBAL LIGATION;  Surgeon: Lahoma Crocker, MD;  Location: [facility];  Service: Gynecology;  Laterality: Bilateral;  . LAPAROSCOPY  at age 50  . LUMBAR SPINE SURGERY    . ROBOTIC ASSISTED LAP VAGINAL HYSTERECTOMY N/A 10/09/2012   Procedure: ROBOTIC ASSISTED LAPAROSCOPIC VAGINAL HYSTERECTOMY;  Surgeon: Lahoma Crocker, MD;  Location: [facility];  Service: Gynecology;  Laterality: N/A;  . TUBAL LIGATION    . UMBILICAL HERNIA REPAIR     as a child    Family History  Problem Relation Age of Onset  . Hypertension Mother   . Cancer Maternal Aunt   . Cancer Maternal Uncle   . Diabetes Maternal  Grandmother   . Stroke Other        aunt  . Sickle cell anemia Other   . Anesthesia problems Neg Hx   . Hypotension Neg Hx   . Malignant hyperthermia Neg Hx   . Pseudochol deficiency Neg Hx     Social History Social History   Tobacco Use  . Smoking status: Former Smoker    Packs/day: 0.50    Types: Cigarettes, Cigars    Start date: 07/09/1995    Quit date: 11/20/2011    Years since quitting: 8.2  . Smokeless tobacco: Never Used  . Tobacco comment: pt quit cigarettes in 2009, pt smoke 2 black and milds a day until 09-2011  Vaping Use  . Vaping Use: Never used  Substance Use Topics  . Alcohol use: Yes    Alcohol/week: 0.0 standard drinks    Comment: Occasionally, "maybe 1-2 times/month"  . Drug use: Yes    Frequency: 2.0 times per week    Types: Marijuana    Allergies  Allergen Reactions  . Morphine And Related Anaphylaxis    Pt can take dilaudid  . Aspirin Itching    Pt can tolerate ibuprofen  . Contrast Media [Iodinated Diagnostic Agents] Nausea And Vomiting  . Tramadol Itching  . Vicodin [Hydrocodone-Acetaminophen] Itching    Pt can take percocet    Current Outpatient Medications  Medication  Sig Dispense Refill  . albuterol (PROVENTIL HFA;VENTOLIN HFA) 108 (90 Base) MCG/ACT inhaler Inhale 2 puffs into the lungs every 6 (six) hours as needed for wheezing or shortness of breath. 1 Inhaler 0  . albuterol (PROVENTIL) (2.5 MG/3ML) 0.083% nebulizer solution Take 3 mLs (2.5 mg total) by nebulization every 2 (two) hours as needed for wheezing or shortness of breath. 75 mL 0  . dextromethorphan-guaiFENesin (MUCINEX DM) 30-600 MG 12hr tablet Take 2 tablets by mouth 2 (two) times daily as needed for cough. (Patient not taking: Reported on 02/07/2020) 10 tablet 0  . fluconazole (DIFLUCAN) 150 MG tablet Take 1 tablet (150 mg total) by mouth once for 1 dose. 1 tablet 0  . hydrOXYzine (ATARAX/VISTARIL) 25 MG tablet Take 1 tablet (25 mg total) by mouth every 6 (six) hours as needed for  anxiety. (Patient not taking: Reported on 02/07/2020) 12 tablet 0  . metroNIDAZOLE (FLAGYL) 500 MG tablet Take 1 tablet (500 mg total) by mouth 2 (two) times daily. 14 tablet 2  . predniSONE (DELTASONE) 20 MG tablet Take 2 tablets (40 mg total) by mouth daily with breakfast. (Patient not taking: Reported on 02/07/2020) 8 tablet 0   No current facility-administered medications for this visit.    Review of Systems Review of Systems Constitutional: negative for fatigue and weight loss Respiratory: negative for cough and wheezing Cardiovascular: negative for chest pain, fatigue and palpitations Gastrointestinal: negative for abdominal pain and change in bowel habits Genitourinary:positive for vaginal itching, irritation and odor Integument/breast: negative for nipple discharge Musculoskeletal:negative for myalgias Neurological: negative for gait problems and tremors Behavioral/Psych: negative for abusive relationship, depression Endocrine: negative for temperature intolerance      Blood pressure 116/72, pulse 89, height 5\' 6"  (1.676 m), weight 159 lb (72.1 kg), last menstrual period 05/01/2012.  Physical Exam Physical Exam           General: Alert and no distress Abdomen:  normal findings: no organomegaly, soft, non-tender and no hernia  Pelvis:  External genitalia: normal general appearance Urinary system: urethral meatus normal and bladder without fullness, nontender Vaginal: normal without tenderness, induration or masses Cervix: absent Adnexa: normal bimanual exam Uterus: absent    50% of 15 min visit spent on counseling and coordination of care.   Data Reviewed Wet Prep  Assessment       1. Vaginal discharge Rx: - Cervicovaginal ancillary only  2. BV (bacterial vaginosis) Rx: - metroNIDAZOLE (FLAGYL) 500 MG tablet; Take 1 tablet (500 mg total) by mouth 2 (two) times daily.  Dispense: 14 tablet; Refill: 2  3. Candida vaginitis Rx: - fluconazole (DIFLUCAN) 150 MG tablet;  Take 1 tablet (150 mg total) by mouth once for 1 dose.  Dispense: 1 tablet; Refill: 0  Plan  Follow up prn   No orders of the defined types were placed in this encounter.  Meds ordered this encounter  Medications  . metroNIDAZOLE (FLAGYL) 500 MG tablet    Sig: Take 1 tablet (500 mg total) by mouth 2 (two) times daily.    Dispense:  14 tablet    Refill:  2  . fluconazole (DIFLUCAN) 150 MG tablet    Sig: Take 1 tablet (150 mg total) by mouth once for 1 dose.    Dispense:  1 tablet    Refill:  0     Shelly Bombard, MD 02/07/2020 3:37 PM

## 2020-02-07 NOTE — Progress Notes (Signed)
Pt is in the office for GYN visit. Pt complains of vaginal itching, irritation and odor. Hx of hysterectomy

## 2020-02-08 ENCOUNTER — Other Ambulatory Visit: Payer: Self-pay | Admitting: Obstetrics

## 2020-02-08 LAB — CERVICOVAGINAL ANCILLARY ONLY
Bacterial Vaginitis (gardnerella): POSITIVE — AB
Candida Glabrata: NEGATIVE
Candida Vaginitis: NEGATIVE
Comment: NEGATIVE
Comment: NEGATIVE
Comment: NEGATIVE

## 2020-05-03 ENCOUNTER — Other Ambulatory Visit: Payer: Self-pay | Admitting: *Deleted

## 2020-05-03 MED ORDER — FLUCONAZOLE 150 MG PO TABS: 150.0000 mg | ORAL_TABLET | Freq: Once | ORAL | 1 refills | Status: AC

## 2020-05-03 NOTE — Progress Notes (Signed)
Pt called to office with symptoms of yeast and request medication.  Diflucan sent today.

## 2020-10-16 ENCOUNTER — Other Ambulatory Visit: Payer: Self-pay | Admitting: Pulmonary Disease

## 2020-10-16 ENCOUNTER — Emergency Department (HOSPITAL_COMMUNITY): Payer: Medicaid Other

## 2020-10-16 ENCOUNTER — Other Ambulatory Visit: Payer: Self-pay

## 2020-10-16 ENCOUNTER — Emergency Department (HOSPITAL_COMMUNITY)
Admission: EM | Admit: 2020-10-16 | Discharge: 2020-10-16 | Disposition: A | Discharge status: Home / Self Care | Payer: Medicaid Other | Attending: Emergency Medicine | Admitting: Emergency Medicine

## 2020-10-16 DIAGNOSIS — R079 Chest pain, unspecified: Secondary | ICD-10-CM | POA: Diagnosis not present

## 2020-10-16 DIAGNOSIS — R0602 Shortness of breath: Secondary | ICD-10-CM | POA: Insufficient documentation

## 2020-10-16 DIAGNOSIS — M79605 Pain in left leg: Secondary | ICD-10-CM | POA: Insufficient documentation

## 2020-10-16 DIAGNOSIS — I2782 Chronic pulmonary embolism: Secondary | ICD-10-CM

## 2020-10-16 DIAGNOSIS — Z87891 Personal history of nicotine dependence: Secondary | ICD-10-CM | POA: Insufficient documentation

## 2020-10-16 DIAGNOSIS — M79602 Pain in left arm: Secondary | ICD-10-CM | POA: Insufficient documentation

## 2020-10-16 DIAGNOSIS — J45909 Unspecified asthma, uncomplicated: Secondary | ICD-10-CM | POA: Insufficient documentation

## 2020-10-16 LAB — BASIC METABOLIC PANEL
Anion gap: 5 (ref 5–15)
BUN: 11 mg/dL (ref 6–20)
CO2: 28 mmol/L (ref 22–32)
Calcium: 8.8 mg/dL — ABNORMAL LOW (ref 8.9–10.3)
Chloride: 105 mmol/L (ref 98–111)
Creatinine, Ser: 0.75 mg/dL (ref 0.44–1.00)
GFR, Estimated: >60 mL/min (ref >60)
Glucose, Bld: 81 mg/dL (ref 70–99)
Potassium: 3.8 mmol/L (ref 3.5–5.1)
Sodium: 138 mmol/L (ref 135–145)

## 2020-10-16 LAB — I-STAT BETA HCG BLOOD, ED (MC, WL, AP ONLY): I-stat hCG, quantitative: <5 m[IU]/mL (ref <5)

## 2020-10-16 LAB — CBC
HCT: 39.4 % (ref 36.0–46.0)
Hemoglobin: 13.3 g/dL (ref 12.0–15.0)
MCH: 32.4 pg (ref 26.0–34.0)
MCHC: 33.8 g/dL (ref 30.0–36.0)
MCV: 96.1 fL (ref 80.0–100.0)
Platelets: 300 10*3/uL (ref 150–400)
RBC: 4.1 MIL/uL (ref 3.87–5.11)
RDW: 13.1 % (ref 11.5–15.5)
WBC: 8.7 10*3/uL (ref 4.0–10.5)
nRBC: 0 % (ref 0.0–0.2)

## 2020-10-16 LAB — TROPONIN I (HIGH SENSITIVITY): Troponin I (High Sensitivity): <2 ng/L (ref <18)

## 2020-10-16 MED ORDER — ACETAMINOPHEN 500 MG PO TABS
1000.0000 mg | ORAL_TABLET | Freq: Once | ORAL | Status: AC
  Administered 2020-10-16: 1000 mg via ORAL
  Filled 2020-10-16: qty 2

## 2020-10-16 MED ORDER — APIXABAN 5 MG PO TABS
10.0000 mg | ORAL_TABLET | Freq: Once | ORAL | Status: AC
  Administered 2020-10-16: 10 mg via ORAL
  Filled 2020-10-16: qty 2

## 2020-10-16 MED ORDER — APIXABAN (ELIQUIS) VTE STARTER PACK (10MG AND 5MG): ORAL_TABLET | ORAL | 1 refills | Status: DC

## 2020-10-16 MED ORDER — IOHEXOL 350 MG/ML SOLN
50.0000 mL | Freq: Once | INTRAVENOUS | Status: AC | PRN
  Administered 2020-10-16: 50 mL via INTRAVENOUS

## 2020-10-16 MED ORDER — DIAZEPAM 5 MG PO TABS
5.0000 mg | ORAL_TABLET | Freq: Once | ORAL | Status: AC
  Administered 2020-10-16: 5 mg via ORAL
  Filled 2020-10-16: qty 1

## 2020-10-16 MED ORDER — APIXABAN (ELIQUIS) EDUCATION KIT FOR DVT/PE PATIENTS
PACK | Freq: Once | Status: AC
  Filled 2020-10-16: qty 1

## 2020-10-16 NOTE — Progress Notes (Signed)
New chronic PE. Agree with anticoagulation. TTE and follow up as outpatient with me requested per referral from Dr. Norval Morton.

## 2020-10-16 NOTE — ED Triage Notes (Signed)
Pt BIBA from [place] office for abnormal EKG and persistent SOB. Pt states its been going on for awhile now and she wakes up coughing in her sleep/choking. Hx of blood clots in the lungs in 2013.

## 2020-10-16 NOTE — Discharge Instructions (Signed)
Please call the pulmonologist office to schedule an appointment for follow-up in about 1 month. Take the Eliquis as directed.  It is important you not miss any doses. Return the emergency department if symptoms significantly worsen. Follow closely with your primary care provider.     Information on my medicine - ELIQUIS (apixaban)  Why was Eliquis prescribed for you? Eliquis was prescribed to treat blood clots that may have been found in the veins of your legs (deep vein thrombosis) or in your lungs (pulmonary embolism) and to reduce the risk of them occurring again.  What do You need to know about Eliquis ? The starting dose is 10 mg (two 5 mg tablets) taken TWICE daily for the FIRST SEVEN (7) DAYS, then on 10/23/2020  the dose is reduced to ONE 5 mg tablet taken TWICE daily.  Eliquis may be taken with or without food.   Try to take the dose about the same time in the morning and in the evening. If you have difficulty swallowing the tablet whole please discuss with your pharmacist how to take the medication safely.  Take Eliquis exactly as prescribed and DO NOT stop taking Eliquis without talking to the doctor who prescribed the medication.  Stopping may increase your risk of developing a new blood clot.  Refill your prescription before you run out.  After discharge, you should have regular check-up appointments with your healthcare provider that is prescribing your Eliquis.    What do you do if you miss a dose? If a dose of ELIQUIS is not taken at the scheduled time, take it as soon as possible on the same day and twice-daily administration should be resumed. The dose should not be doubled to make up for a missed dose.  Important Safety Information A possible side effect of Eliquis is bleeding. You should call your healthcare provider right away if you experience any of the following: ? Bleeding from an injury or your nose that does not stop. ? Unusual colored urine (red or  dark brown) or unusual colored stools (red or black). ? Unusual bruising for unknown reasons. ? A serious fall or if you hit your head (even if there is no bleeding).  Some medicines may interact with Eliquis and might increase your risk of bleeding or clotting while on Eliquis. To help avoid this, consult your healthcare provider or pharmacist prior to using any new prescription or non-prescription medications, including herbals, vitamins, non-steroidal anti-inflammatory drugs (NSAIDs) and supplements.  This website has more information on Eliquis (apixaban): http://www.eliquis.com/eliquis/home

## 2020-10-16 NOTE — ED Provider Notes (Signed)
[facility] EMERGENCY DEPARTMENT Provider Note   CSN: [id number] Arrival date & time: 10/16/20  1224     History Chief Complaint  Patient presents with  . Shortness of Breath  . Chest Pain    Megan Chandler is a 45 y.o. female.  45 year old female presents with complaint of shortness of breath and chest pain for about a month, worse today.  Described as feeling like someone made her swallow a whole bunch of bricks, located generalized chest also with pain in her left arm and left leg.  Patient states that this feels similar to when she had the blood clot in her heart and lung in 2013.  Patient is not currently anticoagulated.  Patient has a history of asthma, has used her breathing treatments without any improvement in her symptoms, does not feel like asthma exacerbation.  Denies nausea, vomiting, fevers, chills, changes in bowel or bladder habits or any other complaints or concerns.        Past Medical History:  Diagnosis Date  . Adenomyosis   . Anemia   . Anxiety   . Asthma    daily inhaler use  . Bartholin's cyst    hx of  . Bronchitis   . Endometriosis   . GERD (gastroesophageal reflux disease)   . Menometrorrhagia   . Pneumonia 08/18/2018  . Pulmonary embolism ([place])    11/2011/ states no since  anticoagulation 1/14  . Sciatica     Patient Active Problem List   Diagnosis Date Noted  . CAP (community acquired pneumonia) 08/18/2018  . Viral gastroenteritis 08/18/2018  . Hypokalemia 08/18/2018  . Asthma exacerbation 08/17/2018  . Frequent falls 12/09/2016  . Grief 03/14/2016  . Left lumbosacral radiculopathy 01/02/2015  . GERD without esophagitis 02/26/2013  . Shortness of breath 04/23/2012  . Anxiety 11/20/2011  . Endometriosis 11/20/2011  . Asthma 11/20/2011    Past Surgical History:  Procedure Laterality Date  . ABDOMINAL HYSTERECTOMY    . BACK SURGERY     07/2011  . BILATERAL SALPINGECTOMY Bilateral 10/09/2012   Procedure:  BILATERAL SALPINGECTOMY;  Surgeon: Lahoma Crocker, MD;  Location: [facility];  Service: Gynecology;  Laterality: Bilateral;  . CERVICAL SPINE SURGERY  2009  . CESAREAN SECTION  1996  . CHOLECYSTECTOMY, LAPAROSCOPIC  1998  . DILATION AND CURETTAGE OF UTERUS    . HYSTEROSCOPY  04/02/2012   Procedure: HYSTEROSCOPY;  Surgeon: Lahoma Crocker, MD;  Location: [facility];  Service: Gynecology;  Laterality: N/A;  with Dilitation and curretage and attempted hydrothermal ablation  . LAPAROSCOPIC TUBAL LIGATION  02/12/2012   Procedure: LAPAROSCOPIC TUBAL LIGATION;  Surgeon: Lahoma Crocker, MD;  Location: [facility];  Service: Gynecology;  Laterality: Bilateral;  . LAPAROSCOPY  at age 1  . LUMBAR SPINE SURGERY    . ROBOTIC ASSISTED LAP VAGINAL HYSTERECTOMY N/A 10/09/2012   Procedure: ROBOTIC ASSISTED LAPAROSCOPIC VAGINAL HYSTERECTOMY;  Surgeon: Lahoma Crocker, MD;  Location: [facility];  Service: Gynecology;  Laterality: N/A;  . TUBAL LIGATION    . UMBILICAL HERNIA REPAIR     as a child     OB History    Gravida  2   Para  1   Term  1   Preterm  0   AB  1   Living  1     SAB  1   IAB  0   Ectopic  0   Multiple  0   Live Births  1  Family History  Problem Relation Age of Onset  . Hypertension Mother   . Cancer Maternal Aunt   . Cancer Maternal Uncle   . Diabetes Maternal Grandmother   . Stroke Other        aunt  . Sickle cell anemia Other   . Anesthesia problems Neg Hx   . Hypotension Neg Hx   . Malignant hyperthermia Neg Hx   . Pseudochol deficiency Neg Hx     Social History   Tobacco Use  . Smoking status: Former Smoker    Packs/day: 0.50    Types: Cigarettes, Cigars    Start date: 07/09/1995    Quit date: 11/20/2011    Years since quitting: 8.9  . Smokeless tobacco: Never Used  . Tobacco comment: pt quit cigarettes in 2009, pt smoke 2 black and milds a day until 09-2011  Vaping Use  . Vaping Use: Never used  Substance Use Topics  . Alcohol use: Yes     Alcohol/week: 0.0 standard drinks    Comment: Occasionally, "maybe 1-2 times/month"  . Drug use: Yes    Frequency: 2.0 times per week    Types: Marijuana    Home Medications Prior to Admission medications   Medication Sig Start Date End Date Taking? Authorizing Provider  albuterol (PROVENTIL HFA;VENTOLIN HFA) 108 (90 Base) MCG/ACT inhaler Inhale 2 puffs into the lungs every 6 (six) hours as needed for wheezing or shortness of breath. 08/19/18   Patrecia Pour, MD  albuterol (PROVENTIL) (2.5 MG/3ML) 0.083% nebulizer solution Take 3 mLs (2.5 mg total) by nebulization every 2 (two) hours as needed for wheezing or shortness of breath. 08/19/18   Patrecia Pour, MD  dextromethorphan-guaiFENesin (MUCINEX DM) 30-600 MG 12hr tablet Take 2 tablets by mouth 2 (two) times daily as needed for cough. Patient not taking: Reported on 02/07/2020 08/19/18   Patrecia Pour, MD  hydrOXYzine (ATARAX/VISTARIL) 25 MG tablet Take 1 tablet (25 mg total) by mouth every 6 (six) hours as needed for anxiety. Patient not taking: Reported on 02/07/2020 08/19/18   Patrecia Pour, MD  metroNIDAZOLE (FLAGYL) 500 MG tablet Take 1 tablet (500 mg total) by mouth 2 (two) times daily. 02/07/20   Shelly Bombard, MD  predniSONE (DELTASONE) 20 MG tablet Take 2 tablets (40 mg total) by mouth daily with breakfast. Patient not taking: Reported on 02/07/2020 08/20/18   Patrecia Pour, MD    Allergies    Morphine and related, Aspirin, Tramadol, and Vicodin [hydrocodone-acetaminophen]  Review of Systems   Review of Systems  Constitutional: Negative for chills and fever.  Respiratory: Positive for shortness of breath. Negative for chest tightness and wheezing.   Cardiovascular: Positive for chest pain. Negative for leg swelling.  Gastrointestinal: Negative for abdominal pain, constipation, diarrhea, nausea and vomiting.  Genitourinary: Negative for difficulty urinating and dysuria.  Musculoskeletal: Positive for arthralgias and back pain.  Negative for joint swelling.  Skin: Negative for rash and wound.  Allergic/Immunologic: Negative for immunocompromised state.  Neurological: Negative for weakness.  Hematological: Negative for adenopathy.  All other systems reviewed and are negative.   Physical Exam Updated Vital Signs BP 117/82 (BP Location: Right Arm)   Pulse 65   Temp 98.3 F (36.8 C) (Oral)   Resp (!) 22   LMP 05/01/2012   SpO2 96%   Physical Exam Vitals and nursing note reviewed.  Constitutional:      General: She is not in acute distress.    Appearance: She is well-developed. She  is not diaphoretic.  HENT:     Head: Normocephalic and atraumatic.  Cardiovascular:     Rate and Rhythm: Normal rate and regular rhythm.  Pulmonary:     Effort: Pulmonary effort is normal.     Breath sounds: Normal breath sounds. No decreased breath sounds or wheezing.  Chest:     Chest wall: No tenderness.  Abdominal:     Palpations: Abdomen is soft.     Tenderness: There is no abdominal tenderness.  Musculoskeletal:     Right lower leg: No tenderness. No edema.     Left lower leg: No tenderness. No edema.  Skin:    General: Skin is warm and dry.     Findings: No erythema or rash.  Neurological:     Mental Status: She is alert and oriented to person, place, and time.  Psychiatric:        Behavior: Behavior normal.     ED Results / Procedures / Treatments   Labs (all labs ordered are listed, but only abnormal results are displayed) Labs Reviewed  BASIC METABOLIC PANEL - Abnormal; Notable for the following components:      Result Value   Calcium 8.8 (*)    All other components within normal limits  CBC  I-STAT BETA HCG BLOOD, ED ([facility], [facility], [facility] ONLY)  TROPONIN I (HIGH SENSITIVITY)    EKG EKG Interpretation  Date/Time:  Monday October 16 2020 12:25:25 EDT Ventricular Rate:  63 PR Interval:  195 QRS Duration: 66 QT Interval:  620 QTC Calculation: 635 R Axis:   72 Text Interpretation: Sinus rhythm Borderline  T wave abnormalities Prolonged QT interval Confirmed by Malvin Johns [phone]) on 10/16/2020 12:58:29 PM   Radiology DG Chest 2 View  Result Date: 10/16/2020 CLINICAL DATA:  Chest pain, shortness of breath. EXAM: CHEST - 2 VIEW COMPARISON:  None. FINDINGS: The heart size and mediastinal contours are within normal limits. Both lungs are clear. No pneumothorax or pleural effusion is noted. The visualized skeletal structures are unremarkable. IMPRESSION: No active cardiopulmonary disease. Electronically Signed   By: Marijo Conception M.D.   On: 10/16/2020 14:48    Procedures Procedures   Medications Ordered in ED Medications - No data to display  ED Course  I have reviewed the triage vital signs and the nursing notes.  Pertinent labs & imaging results that were available during my care of the patient were reviewed by me and considered in my medical decision making (see chart for details).  Clinical Course as of 10/16/20 1523  Mon Oct 16, 4232  [id number] 45 year old female with history of extensive PE in 2013 presents with complaint of chest pain and shortness of breath as above. On exam, respirations are even and unlabored, lungs are clear, no chest wall tenderness.  No lower extremity pain or swelling. Labs reviewed and reassuring including CBC, BMP, troponin.  hCG negative. Is not tachycardic.  Vitals unremarkable. CTA chest ordered for further evaluation of possible PE due to history. [LM]  1522 Care signed out pending PE study. [LM]    Clinical Course User Index [LM] Roque Lias   MDM Rules/Calculators/A&P                         Final Clinical Impression(s) / ED Diagnoses Final diagnoses:  Shortness of breath    Rx / DC Orders ED Discharge Orders    None       Tacy Learn, PA-C 10/16/20  [id number]    Malvin Johns, MD 10/16/20 1525

## 2020-10-16 NOTE — ED Provider Notes (Signed)
Care assumed at shift change from Suella Broad, PA-C, pending CTA imaging to rule out PE. See their note for full HPI and workup. Briefly, pt with history of PE, presenting for 1 month of shortness of breath with chest pain and back pain.  Symptoms feel similar to prior PE, however has not been on anticoagulation.  No symptoms of DVT.  She is stable, symptoms are chronic, initial troponin is negative does not would need repeat.  Plan to follow CTA.  If negative discharged home with PCP follow-up.  Physical Exam  BP (!) 122/91 (BP Location: Right Arm)   Pulse 62   Temp 98.3 F (36.8 C) (Oral)   Resp 16   Ht 5\' 6"  (1.676 m)   Wt 72.6 kg   LMP 05/01/2012   SpO2 100%   BMI 25.82 kg/m   Physical Exam Vitals and nursing note reviewed.  Constitutional:      Appearance: She is well-developed.  HENT:     Head: Normocephalic and atraumatic.  Eyes:     Conjunctiva/sclera: Conjunctivae normal.  Cardiovascular:     Rate and Rhythm: Normal rate.  Pulmonary:     Effort: Pulmonary effort is normal. No respiratory distress.     Breath sounds: Normal breath sounds.  Neurological:     Mental Status: She is alert.  Psychiatric:        Mood and Affect: Mood normal.        Behavior: Behavior normal.    Results for orders placed or performed during the hospital encounter of 81/19/14  Basic metabolic panel  Result Value Ref Range   Sodium 138 135 - 145 mmol/L   Potassium 3.8 3.5 - 5.1 mmol/L   Chloride 105 98 - 111 mmol/L   CO2 28 22 - 32 mmol/L   Glucose, Bld 81 70 - 99 mg/dL   BUN 11 6 - 20 mg/dL   Creatinine, Ser 0.75 0.44 - 1.00 mg/dL   Calcium 8.8 (L) 8.9 - 10.3 mg/dL   GFR, Estimated >60 >60 mL/min   Anion gap 5 5 - 15  CBC  Result Value Ref Range   WBC 8.7 4.0 - 10.5 K/uL   RBC 4.10 3.87 - 5.11 MIL/uL   Hemoglobin 13.3 12.0 - 15.0 g/dL   HCT 39.4 36.0 - 46.0 %   MCV 96.1 80.0 - 100.0 fL   MCH 32.4 26.0 - 34.0 pg   MCHC 33.8 30.0 - 36.0 g/dL   RDW 13.1 11.5 - 15.5 %    Platelets 300 150 - 400 K/uL   nRBC 0.0 0.0 - 0.2 %  I-Stat beta hCG blood, ED  Result Value Ref Range   I-stat hCG, quantitative <5.0 <5 mIU/mL   Comment 3          Troponin I (High Sensitivity)  Result Value Ref Range   Troponin I (High Sensitivity) <2 <18 ng/L   DG Chest 2 View  Result Date: 10/16/2020 CLINICAL DATA:  Chest pain, shortness of breath. EXAM: CHEST - 2 VIEW COMPARISON:  None. FINDINGS: The heart size and mediastinal contours are within normal limits. Both lungs are clear. No pneumothorax or pleural effusion is noted. The visualized skeletal structures are unremarkable. IMPRESSION: No active cardiopulmonary disease. Electronically Signed   By: Marijo Conception M.D.   On: 10/16/2020 14:48   CT Angio Chest PE W/Cm &/Or Wo Cm  Result Date: 10/16/2020 CLINICAL DATA:  PE suspected, high prob Chest pain and shortness of breath. EXAM: CT ANGIOGRAPHY CHEST  WITH CONTRAST TECHNIQUE: Multidetector CT imaging of the chest was performed using the standard protocol during bolus administration of intravenous contrast. Multiplanar CT image reconstructions and MIPs were obtained to evaluate the vascular anatomy. CONTRAST:  4mL OMNIPAQUE IOHEXOL 350 MG/ML SOLN COMPARISON:  Radiograph earlier today. Multiple prior exams, most recently 01/28/2012 FINDINGS: Cardiovascular: There is chronic thrombus in the left lower lobe pulmonary artery with circumferential low-density. Thin string of contrast extends distally. The left lower lobe pulmonary arteries are attenuated. This corresponds to history of left lower lobe pulmonary emboli. No evidence of acute pulmonary arterial thrombus. Heart is normal in size. No pericardial effusion. Thoracic aorta is normal in caliber. Mediastinum/Nodes: No mediastinal or hilar adenopathy. No axillary adenopathy. No visualized thyroid nodule. Decompressed esophagus. Lungs/Pleura: Minimal scarring in the left lower lobe. Moderate bronchial thickening. No acute airspace disease.  No pleural fluid or findings of pulmonary edema. Trachea and central bronchi are patent. Upper Abdomen: Ingested material distends the stomach. No acute upper abdominal findings. Musculoskeletal: There are no acute or suspicious osseous abnormalities. Review of the MIP images confirms the above findings. IMPRESSION: 1. No acute pulmonary embolus. 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 2. Moderate bronchial thickening. Electronically Signed   By: Keith Rake M.D.   On: 10/16/2020 16:17     ED Course/Procedures   Clinical Course as of 10/16/20 2014  Mon Oct 17, 5151  [id number] 45 year old female with history of extensive PE in 2013 presents with complaint of chest pain and shortness of breath as above. On exam, respirations are even and unlabored, lungs are clear, no chest wall tenderness.  No lower extremity pain or swelling. Labs reviewed and reassuring including CBC, BMP, troponin.  hCG negative. Is not tachycardic.  Vitals unremarkable. CTA chest ordered for further evaluation of possible PE due to history. [LM]  1522 Care signed out pending PE study. [LM]  61 Consulted with pulmonology, who reviewed imaging. Recommends begin Eliquis as it is difficult to determine chronicity of findings on CTA today, f/u with pulmonary outpatient, Dr. Silas Flood [JR]  1729 Patient is in no distress on reevaluation.  Agrees with care plan.  Recommend close follow-up outpatient.  Strict return precautions [JR]    Clinical Course User Index [JR] Jaeleah Smyser, [place] N, PA-C [LM] Tacy Learn, PA-C    Procedures  MDM  8:14 PM PE study is negative for acute PE though does show chronic PE.  She states she was only taking anticoagulation for about 2 months following the PE. She states symptoms feel similar. Lungs are clear, normal resp effort. O2 sat excellent. CP  described as heaviness but also pinching pain. Does not feel like asthma. Considering CTA findings, unsure if patient requires any additional anticoag? Discussed with attending Dr. Zenia Resides, will consult pulmonary for recommendations.      Kaysi Ourada, [place] N, PA-C 10/16/20 2014    Lacretia Leigh, MD 10/19/20 1109

## 2020-10-16 NOTE — ED Notes (Signed)
Patient transported to CT 

## 2020-10-16 NOTE — ED Notes (Signed)
All appropriate discharge materials reviewed at length with patient. Time for questions provided. Pt has no other questions at this time and verbalizes understanding of all provided materials.  

## 2020-10-17 ENCOUNTER — Telehealth: Payer: Self-pay | Admitting: *Deleted

## 2020-10-17 ENCOUNTER — Telehealth: Payer: Self-pay

## 2020-10-17 NOTE — Telephone Encounter (Signed)
Noted.  Will close encounter.  

## 2020-10-17 NOTE — Telephone Encounter (Signed)
We got it looks like they have gave to [facility] to [place] then Echo calls the patient to schedule

## 2020-10-17 NOTE — Telephone Encounter (Signed)
Received staff message from [facility] to get patient scheduled after her echo. Patient was seen in the ED yesterday. He would like for her to follow up with him in July 2022 after her echo. Order has been placed. Per MH, will check with the PCCs to see if they can see the order he placed from the hospital yesterday.   [facility], please advise if you all can see the order for the echo?

## 2020-10-17 NOTE — Telephone Encounter (Signed)
Pt called regarding her pharmacy not having Eliquis kit and requesting being changed back to coumadin.  RNCM advised that she will need to contact PCP to change Rx as they will follow her care.  Pt called back stating she secured appointment with PCP later today.

## 2020-10-27 ENCOUNTER — Telehealth: Payer: Self-pay | Admitting: Pulmonary Disease

## 2020-10-27 DIAGNOSIS — R079 Chest pain, unspecified: Secondary | ICD-10-CM

## 2020-10-27 DIAGNOSIS — R9431 Abnormal electrocardiogram [ECG] [EKG]: Secondary | ICD-10-CM

## 2020-10-27 NOTE — Telephone Encounter (Signed)
Need a TTE prior to the visit with me. There is no obvious reason for the pain to be from the lungs based on the recent CT. She needs to continue the blood thinner. She has been referred to cardiology which I agree with - would ask her to look into this current referral or refer to our colleagues here in [place] (current referral via [facility] system). If her pain is uncontrolled she should seek care from her PCP or urgent care/ED. My role in her care is to evaluate the chronic PE and if she is a candidate for additional therapies, specifically pulmonary HTN that can occur with chronic blood clots.

## 2020-10-27 NOTE — Telephone Encounter (Signed)
Called and spoke with patient, advised her of the recommendations per Dr. Silas Flood.  She verbalized understanding.  I did not see an order for a referral to cardiology, referral placed for first available.  Nothing further needed.

## 2020-10-27 NOTE — Telephone Encounter (Signed)
The echocardiogram complete already ordered is what is needed. Just need to get it scheduled prior to our visit. Thanks!

## 2020-10-27 NOTE — Telephone Encounter (Signed)
ATC X2--unable to leave vm due to mailbox not being setup.  Lm for patient's mother, Maxine(EC)

## 2020-10-27 NOTE — Telephone Encounter (Signed)
Dr. Silas Flood, Do you want the echo TEE or the echo TEE interoperative?  Please advise. Thank you.

## 2020-10-27 NOTE — Telephone Encounter (Signed)
Primary Pulmonologist: Hunsucker Last office visit and with whom: Order placed for echo in July 2022 only, no office visit. What do we see them for (pulmonary problems): asthma, PE Last OV assessment/plan: none  Was appointment offered to patient (explain)?  Scheduled first available for consult, Nov 06, 2020.   Reason for call: Megan Chandler chest pain and feels heavy for a couple of months, has been back and fourth to [facility] and they keep sending her back home.  Nothing different, says it is a chronic blood clot. Hx of PE (left and right) 2013 and feels the same.  Pain in right arm and nauseated daily.  Last seen by MR in 2015 for asthma and PE.  Last seen in ER on 10/17/19, states she had x-rays, CT and MRI.  Still on Eliquis, she feels like the eliquis makes the pain worse.  She would prefer to be on the warfarin.  She states that her asthma is under control.  She has her albuterol inhaler and nebulizer in case she needs it.  She is on the eliquis and now she is on Diazepam, but is afraid to take it and go to sleep.  She states she has had pleuracy in the past as well and it feels similar, but some different.  Feels like she has a brick on her chest.  She denies any sob,  wheezing or coughing.  Dr. Silas Flood, please advise if anything further needed other than first available consult with you.  Thank you.  (examples of things to ask: : When did symptoms start? Fever? Cough? Productive? Color to sputum? More sputum than usual? Wheezing? Have you needed increased oxygen? Are you taking your respiratory medications? What over the counter measures have you tried?)  Allergies  Allergen Reactions  . Morphine And Related Anaphylaxis    Pt can take dilaudid  . Aspirin Itching    Pt can tolerate ibuprofen  . Tramadol Itching  . Vicodin [Hydrocodone-Acetaminophen] Itching    Pt can take percocet    Immunization History  Administered Date(s) Administered  . PPD Test 02/12/2016

## 2020-10-30 ENCOUNTER — Telehealth: Payer: Self-pay | Admitting: Pulmonary Disease

## 2020-10-30 NOTE — Telephone Encounter (Signed)
This echo is in precess for precert healthy blue required 14 days for precert Megan Chandler

## 2020-10-30 NOTE — Telephone Encounter (Signed)
ATC patient unable to reach LM to call back office (x1)   The procedure that has to be scheduled before her appointment on 11/06/20 per DR. Hunsucker is an complete echo.  Does not look like this is scheduled.   Will route to [facility] pool for follow up

## 2020-10-30 NOTE — Telephone Encounter (Signed)
Called and spoke with patient. She is aware that she will receive a call to get the echo scheduled once the pre-cert has been completed. She verbalized understanding.   Nothing further needed at time of call.

## 2020-11-06 ENCOUNTER — Ambulatory Visit: Payer: Medicaid Other | Admitting: Pulmonary Disease

## 2020-11-06 ENCOUNTER — Other Ambulatory Visit: Payer: Self-pay

## 2020-11-06 ENCOUNTER — Encounter: Payer: Self-pay | Admitting: Pulmonary Disease

## 2020-11-06 VITALS — BP 114/70 | HR 85 | Temp 97.4°F | Ht 66.0 in | Wt 160.1 lb

## 2020-11-06 DIAGNOSIS — R0609 Other forms of dyspnea: Secondary | ICD-10-CM

## 2020-11-06 DIAGNOSIS — J4530 Mild persistent asthma, uncomplicated: Secondary | ICD-10-CM | POA: Diagnosis not present

## 2020-11-06 DIAGNOSIS — R072 Precordial pain: Secondary | ICD-10-CM

## 2020-11-06 DIAGNOSIS — R0600 Dyspnea, unspecified: Secondary | ICD-10-CM

## 2020-11-06 DIAGNOSIS — I2782 Chronic pulmonary embolism: Secondary | ICD-10-CM | POA: Diagnosis not present

## 2020-11-06 MED ORDER — ALBUTEROL SULFATE (2.5 MG/3ML) 0.083% IN NEBU: 2.5000 mg | INHALATION_SOLUTION | RESPIRATORY_TRACT | 0 refills | Status: DC | PRN

## 2020-11-06 MED ORDER — ALBUTEROL SULFATE HFA 108 (90 BASE) MCG/ACT IN AERS: 2.0000 | INHALATION_SPRAY | Freq: Four times a day (QID) | RESPIRATORY_TRACT | 0 refills | Status: DC | PRN

## 2020-11-06 MED ORDER — BUDESONIDE-FORMOTEROL FUMARATE 160-4.5 MCG/ACT IN AERO: 2.0000 | INHALATION_SPRAY | Freq: Two times a day (BID) | RESPIRATORY_TRACT | 11 refills | Status: DC

## 2020-11-06 NOTE — Patient Instructions (Addendum)
Nice to see you  Continue Eliquis  Will follow up with you in 1 month after heart ultrasound done  Resume Symbicort 2 puffs twice a day every day - rinse your mouth out after every use  I refilled the albuterol inhaler and nebulizer to be used as needed  Follow up in 1 month

## 2020-11-07 NOTE — Progress Notes (Signed)
@Patient  ID: Megan Chandler, female    DOB: 08-29-1975, 45 y.o.   MRN: [id number]  Chief Complaint  Patient presents with  . Hospitalization Follow-up    HFU. Per patient, she has been experiencing more bilateral chest pain since she has been home from the hospital. Increased SOB as well.     Referring provider: Vonna Drafts, FNP  HPI:   45 year old whom we are seeing for evaluation of chronic PE.  Notably followed by Dr. Chase Caller in pulmonary for this in the past.  Most recent note from 2015 reviewed.  Recent ED note reviewed.  Patient states several weeks of chest pain.  Started in left shoulder but then radiated to left chest and then right chest.  Now complains of change in right shoulder as well.  Pain moves around.  No consistent alleviating factors.  Seems like pressure or laying or positional changes makes things better or worse otherwise.  No timing during the day when things are better or worse.  No seasonal environmental factors she can identify.  No other alleviating or exacerbating factors.  She does encourage endorse some worsening shortness of breath over the last 2 to 4 weeks.  She blames it on the pollen.  She has seasonal allergies.  She is underlying asthma exacerbating allergies worse in pollen season.  She is not using her Symbicort maintenance inhaler.  She does use her albuterol as needed with some moderate relief.  Reviewed recent CTA on my interpretation with narrowing and near obliteration of the left lower lobe PA with chronic thrombus present, CT otherwise clear with mild bronchial thickening.  Reviewed prior CTAs and V/Q scans as well with similar findings several years ago.  PMH: Asthma: Chronic pulmonary embolus Surgical history: Hysterectomy, back surgery, Family history: Mother with hypertension, aunt with diabetes and stroke Social history: Former smoker, lives in [place] / Pulmonary Flowsheets:   ACT:  No flowsheet data  found.  MMRC: mMRC Dyspnea Scale mMRC Score  11/06/2020 1    Epworth:  No flowsheet data found.  Tests:   FENO:  No results found for: NITRICOXIDE  PFT: No flowsheet data found.  WALK:  SIX MIN WALK 08/14/2012  Supplimental Oxygen during Test? (L/min) No  Tech Comments: Pt completed walk with no difficulties    Imaging: Personally reviewed and as per EMR discussion in this note DG Chest 2 View  Result Date: 10/16/2020 CLINICAL DATA:  Chest pain, shortness of breath. EXAM: CHEST - 2 VIEW COMPARISON:  None. FINDINGS: The heart size and mediastinal contours are within normal limits. Both lungs are clear. No pneumothorax or pleural effusion is noted. The visualized skeletal structures are unremarkable. IMPRESSION: No active cardiopulmonary disease. Electronically Signed   By: Marijo Conception M.D.   On: 10/16/2020 14:48   CT Angio Chest PE W/Cm &/Or Wo Cm  Result Date: 10/16/2020 CLINICAL DATA:  PE suspected, high prob Chest pain and shortness of breath. EXAM: CT ANGIOGRAPHY CHEST WITH CONTRAST TECHNIQUE: Multidetector CT imaging of the chest was performed using the standard protocol during bolus administration of intravenous contrast. Multiplanar CT image reconstructions and MIPs were obtained to evaluate the vascular anatomy. CONTRAST:  88mL OMNIPAQUE IOHEXOL 350 MG/ML SOLN COMPARISON:  Radiograph earlier today. Multiple prior exams, most recently 01/28/2012 FINDINGS: Cardiovascular: There is chronic thrombus in the left lower lobe pulmonary artery with circumferential low-density. Thin string of contrast extends distally. The left lower lobe pulmonary arteries are attenuated. This corresponds to history of left  lower lobe pulmonary emboli. No evidence of acute pulmonary arterial thrombus. Heart is normal in size. No pericardial effusion. Thoracic aorta is normal in caliber. Mediastinum/Nodes: No mediastinal or hilar adenopathy. No axillary adenopathy. No visualized thyroid nodule.  Decompressed esophagus. Lungs/Pleura: Minimal scarring in the left lower lobe. Moderate bronchial thickening. No acute airspace disease. No pleural fluid or findings of pulmonary edema. Trachea and central bronchi are patent. Upper Abdomen: Ingested material distends the stomach. No acute upper abdominal findings. Musculoskeletal: There are no acute or suspicious osseous abnormalities. Review of the MIP images confirms the above findings. IMPRESSION: 1. No acute pulmonary embolus. 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 2. Moderate bronchial thickening. Electronically Signed   By: Keith Rake M.D.   On: 10/16/2020 16:17    Lab Results: Personally reviewed, most recent eosinophil count 200 CBC    Component Value Date/Time   WBC 8.7 10/16/2020 1250   RBC 4.10 10/16/2020 1250   HGB 13.3 10/16/2020 1250   HGB 14.6 01/01/2017 1037   HCT 39.4 10/16/2020 1250   HCT 46.8 (H) 01/01/2017 1037   PLT 300 10/16/2020 1250   PLT 311 01/01/2017 1037   MCV 96.1 10/16/2020 1250   MCV 101 (H) 01/01/2017 1037   MCH 32.4 10/16/2020 1250   MCHC 33.8 10/16/2020 1250   RDW 13.1 10/16/2020 1250   RDW 13.6 01/01/2017 1037   LYMPHSABS 3.2 08/17/2018 1732   MONOABS 0.9 08/17/2018 1732   EOSABS 0.2 08/17/2018 1732   BASOSABS 0.1 08/17/2018 1732    BMET    Component Value Date/Time   NA 138 10/16/2020 1250   NA 140 01/01/2017 1037   K 3.8 10/16/2020 1250   CL 105 10/16/2020 1250   CO2 28 10/16/2020 1250   GLUCOSE 81 10/16/2020 1250   BUN 11 10/16/2020 1250   BUN 13 01/01/2017 1037   CREATININE 0.75 10/16/2020 1250   CALCIUM 8.8 (L) 10/16/2020 1250   GFRNONAA >60 10/16/2020 1250   GFRAA >60 08/19/2018 0255    BNP No results found for: BNP  ProBNP    Component Value Date/Time   PROBNP 21.5 11/20/2011 1057    Specialty Problems      Pulmonary  Problems   Asthma    Thi      Shortness of breath   Asthma exacerbation   CAP (community acquired pneumonia)      Allergies  Allergen Reactions  . Morphine And Related Anaphylaxis    Pt can take dilaudid  . Aspirin Itching    Pt can tolerate ibuprofen  . Tramadol Itching  . Vicodin [Hydrocodone-Acetaminophen] Itching    Pt can take percocet    Immunization History  Administered Date(s) Administered  . PPD Test 02/12/2016    Past Medical History:  Diagnosis Date  . Adenomyosis   . Anemia   . Anxiety   . Asthma    daily inhaler use  . Bartholin's cyst    hx of  . Bronchitis   . Endometriosis   . GERD (gastroesophageal reflux disease)   . Menometrorrhagia   . Pneumonia 08/18/2018  . Pulmonary embolism ([place])    11/2011/ states no since  anticoagulation 1/14  . Sciatica     Tobacco History: Social History   Tobacco Use  Smoking Status Former Smoker  . Packs/day: 0.50  . Types: Cigarettes, Cigars  . Start date: 07/09/1995  . Quit  date: 11/20/2011  . Years since quitting: 8.9  Smokeless Tobacco Never Used  Tobacco Comment   pt quit cigarettes in 2009, pt smoke 2 black and milds a day until 09-2011   Counseling given: Not Answered Comment: pt quit cigarettes in 2009, pt smoke 2 black and milds a day until 09-2011   Continue to not smoke  Outpatient Encounter Medications as of 11/06/2020  Medication Sig  . APIXABAN (ELIQUIS) VTE STARTER PACK (10MG  AND 5MG ) Take as directed on package: start with two-5mg  tablets twice daily for 7 days. On day 8, switch to one-5mg  tablet twice daily.  . diazepam (VALIUM) 5 MG tablet Take 5 mg by mouth daily as needed.  . [DISCONTINUED] albuterol (PROVENTIL HFA;VENTOLIN HFA) 108 (90 Base) MCG/ACT inhaler Inhale 2 puffs into the lungs every 6 (six) hours as needed for wheezing or shortness of breath.  . [DISCONTINUED] albuterol (PROVENTIL) (2.5 MG/3ML) 0.083% nebulizer solution Take 3 mLs (2.5 mg total) by nebulization every 2 (two)  hours as needed for wheezing or shortness of breath.  . [DISCONTINUED] budesonide-formoterol (SYMBICORT) 160-4.5 MCG/ACT inhaler Symbicort 160 mcg-4.5 mcg/actuation HFA aerosol inhaler  . albuterol (PROVENTIL) (2.5 MG/3ML) 0.083% nebulizer solution Take 3 mLs (2.5 mg total) by nebulization every 2 (two) hours as needed for wheezing or shortness of breath.  Marland Kitchen albuterol (VENTOLIN HFA) 108 (90 Base) MCG/ACT inhaler Inhale 2 puffs into the lungs every 6 (six) hours as needed for wheezing or shortness of breath.  . budesonide-formoterol (SYMBICORT) 160-4.5 MCG/ACT inhaler Inhale 2 puffs into the lungs in the morning and at bedtime.  . [DISCONTINUED] dextromethorphan-guaiFENesin (MUCINEX DM) 30-600 MG 12hr tablet Take 2 tablets by mouth 2 (two) times daily as needed for cough. (Patient not taking: Reported on 02/07/2020)  . [DISCONTINUED] hydrOXYzine (ATARAX/VISTARIL) 25 MG tablet Take 1 tablet (25 mg total) by mouth every 6 (six) hours as needed for anxiety. (Patient not taking: Reported on 02/07/2020)  . [DISCONTINUED] metroNIDAZOLE (FLAGYL) 500 MG tablet Take 1 tablet (500 mg total) by mouth 2 (two) times daily.  . [DISCONTINUED] predniSONE (DELTASONE) 20 MG tablet Take 2 tablets (40 mg total) by mouth daily with breakfast. (Patient not taking: Reported on 02/07/2020)   No facility-administered encounter medications on file as of 11/06/2020.     Review of Systems  Review of Systems  Chest pain not worse with exertion, more so with positional changes.  No orthopnea or PND.  No lower extremity swelling.  Conference review systems otherwise negative. Physical Exam  BP 114/70   Pulse 85   Temp (!) 97.4 F (36.3 C) (Temporal)   Ht 5\' 6"  (1.676 m)   Wt 160 lb 1.6 oz (72.6 kg)   LMP 05/01/2012   SpO2 100% Comment: on RA  BMI 25.84 kg/m   Wt Readings from Last 5 Encounters:  11/06/20 160 lb 1.6 oz (72.6 kg)  10/16/20 160 lb (72.6 kg)  02/07/20 159 lb (72.1 kg)  08/17/18 159 lb 6.3 oz (72.3 kg)   11/19/17 162 lb 6.4 oz (73.7 kg)    BMI Readings from Last 5 Encounters:  11/06/20 25.84 kg/m  10/16/20 25.82 kg/m  02/07/20 25.66 kg/m  08/17/18 25.73 kg/m  11/19/17 26.21 kg/m     Physical Exam General: Well-appearing, no acute distress Eyes: EOMI, icterus Neck: Supple no JVP Cardiovascular: Regular rate and rhythm, no murmur Pulmonary: Clear to oscillation bilaterally, no wheeze, good air movement Abdomen: Nondistended, sounds present MSK: Mild tenderness to palpation in bilateral shoulders, chest is nontender to palpation,  no synovitis or joint effusions Neuro: Normal gait, no weakness Psych: Normal mood, full affect  Assessment & Plan:   Chronic pulmonary embolus: Present since at least 2013 and prior work-up including VQ scan.  Redemonstrated on recent CT a in the ED.  Resumed on anticoagulation, to continue for now.  Will obtain TTE in coming days to assess for potential CTEPH.  Consider referral to [facility] for potential curative surgery/procedure for chronic PE pending symptom response as below.  Dyspnea on exertion, cough: Worse in the setting of pollen over the last few weeks.  History of asthma.  Most likely symptoms related to uncontrolled asthma.  She is not using her controller inhaler.  Provided prescription for high-dose Symbicort.  In addition, refilled albuterol inhaler and nebulizer.  Chest pain: Do not see any reason for chest pain on CT.  The PE is chronic and should not be causing her symptoms.  The pain is bilateral and not just on the left.  Sounds MSK and often in the shoulders.  Defer further work-up to her PCP.  She has been referred to cardiology for further evaluation.   Return in about 1 month (around 12/07/2020).   Lanier Clam, MD 11/07/2020

## 2020-11-20 ENCOUNTER — Other Ambulatory Visit: Payer: Self-pay

## 2020-11-20 ENCOUNTER — Ambulatory Visit (HOSPITAL_COMMUNITY)
Admission: RE | Admit: 2020-11-20 | Discharge: 2020-11-20 | Disposition: A | Discharge status: Home / Self Care | Payer: Medicaid Other | Source: Ambulatory Visit | Attending: Pulmonary Disease | Admitting: Pulmonary Disease

## 2020-11-20 DIAGNOSIS — R079 Chest pain, unspecified: Secondary | ICD-10-CM | POA: Insufficient documentation

## 2020-11-20 DIAGNOSIS — R9431 Abnormal electrocardiogram [ECG] [EKG]: Secondary | ICD-10-CM | POA: Insufficient documentation

## 2020-11-20 DIAGNOSIS — Z86711 Personal history of pulmonary embolism: Secondary | ICD-10-CM | POA: Diagnosis not present

## 2020-11-20 LAB — ECHOCARDIOGRAM COMPLETE
Area-P 1/2: 2.01 cm2
S' Lateral: 2.9 cm

## 2020-11-20 NOTE — Progress Notes (Signed)
  Echocardiogram 2D Echocardiogram has been performed.  Jennette Dubin 11/20/2020, 10:54 AM

## 2020-11-21 ENCOUNTER — Ambulatory Visit: Payer: Medicaid Other | Admitting: Obstetrics

## 2020-11-22 ENCOUNTER — Ambulatory Visit: Payer: Medicaid Other | Admitting: Obstetrics

## 2020-11-22 ENCOUNTER — Other Ambulatory Visit: Payer: Self-pay

## 2020-11-22 ENCOUNTER — Encounter: Payer: Self-pay | Admitting: Obstetrics

## 2020-11-22 ENCOUNTER — Other Ambulatory Visit (HOSPITAL_COMMUNITY)
Admission: RE | Admit: 2020-11-22 | Discharge: 2020-11-22 | Disposition: A | Discharge status: Home / Self Care | Payer: Medicaid Other | Source: Ambulatory Visit | Attending: Obstetrics | Admitting: Obstetrics

## 2020-11-22 VITALS — BP 119/80 | HR 78 | Ht 66.0 in | Wt 161.0 lb

## 2020-11-22 DIAGNOSIS — N898 Other specified noninflammatory disorders of vagina: Secondary | ICD-10-CM | POA: Insufficient documentation

## 2020-11-22 DIAGNOSIS — B373 Candidiasis of vulva and vagina: Secondary | ICD-10-CM

## 2020-11-22 DIAGNOSIS — N764 Abscess of vulva: Secondary | ICD-10-CM | POA: Diagnosis not present

## 2020-11-22 DIAGNOSIS — B3731 Acute candidiasis of vulva and vagina: Secondary | ICD-10-CM

## 2020-11-22 MED ORDER — FLUCONAZOLE 200 MG PO TABS: 200.0000 mg | ORAL_TABLET | ORAL | 2 refills | Status: DC

## 2020-11-22 MED ORDER — AMOXICILLIN-POT CLAVULANATE 875-125 MG PO TABS: 1.0000 | ORAL_TABLET | Freq: Two times a day (BID) | ORAL | 0 refills | Status: DC

## 2020-11-22 NOTE — Progress Notes (Signed)
RGYN patient presents for problem visit today.  CC: vaginal abscess onset x 2 days ago pt notes area is painful to touch noticed abscess after shaving w/ disposable razor . Pt notes she is currently on blood thinners to to blood clot on lung. Pt would like antibiotic and yeast Rx.

## 2020-11-22 NOTE — Progress Notes (Signed)
Patient ID: Megan Chandler, female   DOB: 1976/04/01, 45 y.o.   MRN: [id number]  Chief Complaint  Patient presents with  . Vaginal Discharge  . Vaginal Pain    HPI Megan Chandler is a 45 y.o. female.  Complains of abscess on outer vaginal area after shaving.  States that the abscessed area is tender but not draining.  Denies fever/chills. HPI  Past Medical History:  Diagnosis Date  . Adenomyosis   . Anemia   . Anxiety   . Asthma    daily inhaler use  . Bartholin's cyst    hx of  . Bronchitis   . Endometriosis   . GERD (gastroesophageal reflux disease)   . Menometrorrhagia   . Pneumonia 08/18/2018  . Pulmonary embolism ([place])    11/2011/ states no since  anticoagulation 1/14  . Sciatica     Past Surgical History:  Procedure Laterality Date  . ABDOMINAL HYSTERECTOMY    . BACK SURGERY     07/2011  . BILATERAL SALPINGECTOMY Bilateral 10/09/2012   Procedure: BILATERAL SALPINGECTOMY;  Surgeon: Lahoma Crocker, MD;  Location: [facility];  Service: Gynecology;  Laterality: Bilateral;  . CERVICAL SPINE SURGERY  2009  . CESAREAN SECTION  1996  . CHOLECYSTECTOMY, LAPAROSCOPIC  1998  . DILATION AND CURETTAGE OF UTERUS    . HYSTEROSCOPY  04/02/2012   Procedure: HYSTEROSCOPY;  Surgeon: Lahoma Crocker, MD;  Location: [facility];  Service: Gynecology;  Laterality: N/A;  with Dilitation and curretage and attempted hydrothermal ablation  . LAPAROSCOPIC TUBAL LIGATION  02/12/2012   Procedure: LAPAROSCOPIC TUBAL LIGATION;  Surgeon: Lahoma Crocker, MD;  Location: [facility];  Service: Gynecology;  Laterality: Bilateral;  . LAPAROSCOPY  at age 34  . LUMBAR SPINE SURGERY    . ROBOTIC ASSISTED LAP VAGINAL HYSTERECTOMY N/A 10/09/2012   Procedure: ROBOTIC ASSISTED LAPAROSCOPIC VAGINAL HYSTERECTOMY;  Surgeon: Lahoma Crocker, MD;  Location: [facility];  Service: Gynecology;  Laterality: N/A;  . TUBAL LIGATION    . UMBILICAL HERNIA REPAIR     as a child    Family History  Problem Relation Age of  Onset  . Hypertension Mother   . Cancer Maternal Aunt   . Cancer Maternal Uncle   . Diabetes Maternal Grandmother   . Stroke Other        aunt  . Sickle cell anemia Other   . Anesthesia problems Neg Hx   . Hypotension Neg Hx   . Malignant hyperthermia Neg Hx   . Pseudochol deficiency Neg Hx     Social History Social History   Tobacco Use  . Smoking status: Former Smoker    Packs/day: 0.50    Types: Cigarettes, Cigars    Start date: 07/09/1995    Quit date: 11/20/2011    Years since quitting: 9.0  . Smokeless tobacco: Never Used  . Tobacco comment: pt quit cigarettes in 2009, pt smoke 2 black and milds a day until 09-2011  Vaping Use  . Vaping Use: Never used  Substance Use Topics  . Alcohol use: Yes    Alcohol/week: 0.0 standard drinks    Comment: Occasionally, "maybe 1-2 times/month"  . Drug use: Yes    Frequency: 2.0 times per week    Types: Marijuana    Allergies  Allergen Reactions  . Morphine And Related Anaphylaxis    Pt can take dilaudid  . Aspirin Itching    Pt can tolerate ibuprofen  . Tramadol Itching  . Vicodin [Hydrocodone-Acetaminophen] Itching    Pt can  take percocet    Current Outpatient Medications  Medication Sig Dispense Refill  . albuterol (PROVENTIL) (2.5 MG/3ML) 0.083% nebulizer solution Take 3 mLs (2.5 mg total) by nebulization every 2 (two) hours as needed for wheezing or shortness of breath. 75 mL 0  . albuterol (VENTOLIN HFA) 108 (90 Base) MCG/ACT inhaler Inhale 2 puffs into the lungs every 6 (six) hours as needed for wheezing or shortness of breath. 1 each 0  . amoxicillin-clavulanate (AUGMENTIN) 875-125 MG tablet Take 1 tablet by mouth 2 (two) times daily. 1 tablet po BID x10 days 20 tablet 0  . APIXABAN (ELIQUIS) VTE STARTER PACK (10MG  AND 5MG ) Take as directed on package: start with two-5mg  tablets twice daily for 7 days. On day 8, switch to one-5mg  tablet twice daily. 1 each 1  . budesonide-formoterol (SYMBICORT) 160-4.5 MCG/ACT inhaler  Inhale 2 puffs into the lungs in the morning and at bedtime. 1 each 11  . diazepam (VALIUM) 5 MG tablet Take 5 mg by mouth daily as needed.    . fluconazole (DIFLUCAN) 200 MG tablet Take 1 tablet (200 mg total) by mouth every 3 (three) days. 3 tablet 2   No current facility-administered medications for this visit.    Review of Systems Review of Systems Constitutional: negative for fatigue and weight loss Respiratory: negative for cough and wheezing Cardiovascular: negative for chest pain, fatigue and palpitations Gastrointestinal: negative for abdominal pain and change in bowel habits Genitourinary: positive for swollen and tender vulva Integument/breast: negative for nipple discharge Musculoskeletal:negative for myalgias Neurological: negative for gait problems and tremors Behavioral/Psych: negative for abusive relationship, depression Endocrine: negative for temperature intolerance      Blood pressure 119/80, pulse 78, height 5\' 6"  (1.676 m), weight 161 lb (73 kg), last menstrual period 05/01/2012.  Physical Exam Physical Exam General:   alert and no distress  Skin:   no rash or abnormalities  Lungs:   clear to auscultation bilaterally  Heart:   regular rate and rhythm, S1, S2 normal, no murmur, click, rub or gallop  Breasts:   not examined  Abdomen:  normal findings: no organomegaly, soft, non-tender and no hernia  Pelvis:  External genitalia: swollen, indurated and tender right vulva Urinary system: urethral meatus normal and bladder without fullness, nontender Vaginal: normal without tenderness, induration or masses Cervix: normal appearance Adnexa: normal bimanual exam Uterus: anteverted and non-tender, normal size    I have spent a total of 15 minutes of face-to-face time, excluding clinical staff time, reviewing notes and preparing to see patient, ordering tests and/or medications, and counseling the patient.  Data Reviewed Labs Wet Prep  Assessment       1.  Vulvar abscess Rx: - amoxicillin-clavulanate (AUGMENTIN) 875-125 MG tablet; Take 1 tablet by mouth 2 (two) times daily. 1 tablet po BID x10 days  Dispense: 20 tablet; Refill: 0  2. Vaginal discharge Rx: - Cervicovaginal ancillary only( [facility])  3. Candida vaginitis Rx: - fluconazole (DIFLUCAN) 200 MG tablet; Take 1 tablet (200 mg total) by mouth every 3 (three) days.  Dispense: 3 tablet; Refill: 2  Plan   Follow up in 2 weeks - MyChart   Meds ordered this encounter  Medications  . amoxicillin-clavulanate (AUGMENTIN) 875-125 MG tablet    Sig: Take 1 tablet by mouth 2 (two) times daily. 1 tablet po BID x10 days    Dispense:  20 tablet    Refill:  0  . fluconazole (DIFLUCAN) 200 MG tablet    Sig: Take 1 tablet (  200 mg total) by mouth every 3 (three) days.    Dispense:  3 tablet    Refill:  2      Shelly Bombard, MD 11/22/2020 1:53 PM

## 2020-11-23 ENCOUNTER — Telehealth: Payer: Self-pay

## 2020-11-23 ENCOUNTER — Other Ambulatory Visit: Payer: Self-pay | Admitting: Obstetrics

## 2020-11-23 DIAGNOSIS — N760 Acute vaginitis: Secondary | ICD-10-CM

## 2020-11-23 DIAGNOSIS — B9689 Other specified bacterial agents as the cause of diseases classified elsewhere: Secondary | ICD-10-CM

## 2020-11-23 LAB — CERVICOVAGINAL ANCILLARY ONLY
Bacterial Vaginitis (gardnerella): POSITIVE — AB
Candida Glabrata: NEGATIVE
Candida Vaginitis: NEGATIVE
Chlamydia: NEGATIVE
Comment: NEGATIVE
Comment: NEGATIVE
Comment: NEGATIVE
Comment: NEGATIVE
Comment: NEGATIVE
Comment: NORMAL
Neisseria Gonorrhea: NEGATIVE
Trichomonas: NEGATIVE

## 2020-11-23 MED ORDER — METRONIDAZOLE 500 MG PO TABS
500.0000 mg | ORAL_TABLET | Freq: Two times a day (BID) | ORAL | 2 refills | Status: DC
Start: 2020-11-23 — End: 2021-09-14

## 2020-11-23 NOTE — Telephone Encounter (Signed)
-----   Message from Shelly Bombard, MD sent at 11/23/2020 12:53 PM EDT ----- Flagyl Rx for BV.  Start taking Flagyl after finishing Augmentin.

## 2020-11-23 NOTE — Progress Notes (Signed)
Heart ultrasound shows normal RV function and normal estimated pulmonary artery pressures. This is reassuring news. Rest of study was normal.

## 2020-11-23 NOTE — Telephone Encounter (Signed)
Tried to contact patient regarding results. No answer and vm is not set up called pt twice around 3:40pm 11/23/20.

## 2020-11-24 ENCOUNTER — Telehealth: Payer: Self-pay | Admitting: Pulmonary Disease

## 2020-11-24 DIAGNOSIS — I2782 Chronic pulmonary embolism: Secondary | ICD-10-CM

## 2020-11-24 NOTE — Telephone Encounter (Signed)
ECHOCARDIOGRAM COMPLETE: Result Notes   Lanier Clam, MD  11/23/2020 1:31 PM EDT      Heart ultrasound shows normal RV function and normal estimated pulmonary artery pressures. This is reassuring news. Rest of study was normal.   Spoke with pt, aware of echo results.  Pt is asking if she can come off the eliquis.  Pt is also asking for pain medication- states that she has pleurisy, experienced this same pain in 2013 when her clots were dissolving.   Pharmacy: [facility] on [address].   Dr. Silas Flood please advise thanks!

## 2020-11-27 MED ORDER — ELIQUIS 5 MG PO TABS: 5.0000 mg | ORAL_TABLET | Freq: Two times a day (BID) | ORAL | 2 refills | Status: DC

## 2020-11-27 NOTE — Addendum Note (Signed)
Addended by: Lorretta Harp on: 11/27/2020 11:31 AM   Modules accepted: Orders

## 2020-11-27 NOTE — Telephone Encounter (Signed)
Recommend continuing blood thinner. I will refer her to [facility] as they have expertise in dealing with chronic blood clots in the lung. They can determine if further treatment is needed.

## 2020-11-27 NOTE — Telephone Encounter (Signed)
Called and spoke with pt letting her know the info and recs stated by Dr. Silas Flood and she verbalized understanding. Pt said that she needed to have a new Rx for eliquis sent to pharmacy for her so Rx has been sent to preferred pharmacy. Nothing further needed.

## 2020-11-28 ENCOUNTER — Ambulatory Visit: Payer: Medicaid Other | Admitting: Interventional Cardiology

## 2021-01-29 ENCOUNTER — Other Ambulatory Visit: Payer: Self-pay

## 2021-01-29 ENCOUNTER — Encounter (HOSPITAL_BASED_OUTPATIENT_CLINIC_OR_DEPARTMENT_OTHER): Payer: Self-pay | Admitting: *Deleted

## 2021-01-29 ENCOUNTER — Emergency Department (HOSPITAL_COMMUNITY)
Admission: EM | Admit: 2021-01-29 | Discharge: 2021-01-29 | Disposition: A | Discharge status: Home / Self Care | Payer: Medicaid Other | Attending: Emergency Medicine | Admitting: Emergency Medicine

## 2021-01-29 ENCOUNTER — Emergency Department (HOSPITAL_BASED_OUTPATIENT_CLINIC_OR_DEPARTMENT_OTHER)
Admission: EM | Admit: 2021-01-29 | Discharge: 2021-01-29 | Disposition: A | Discharge status: Home / Self Care | Payer: Medicaid Other | Source: Home / Self Care | Attending: Emergency Medicine | Admitting: Emergency Medicine

## 2021-01-29 ENCOUNTER — Emergency Department (HOSPITAL_BASED_OUTPATIENT_CLINIC_OR_DEPARTMENT_OTHER): Payer: Medicaid Other | Admitting: Radiology

## 2021-01-29 DIAGNOSIS — Z7901 Long term (current) use of anticoagulants: Secondary | ICD-10-CM | POA: Insufficient documentation

## 2021-01-29 DIAGNOSIS — R079 Chest pain, unspecified: Secondary | ICD-10-CM | POA: Diagnosis present

## 2021-01-29 DIAGNOSIS — R531 Weakness: Secondary | ICD-10-CM | POA: Insufficient documentation

## 2021-01-29 DIAGNOSIS — M25512 Pain in left shoulder: Secondary | ICD-10-CM | POA: Insufficient documentation

## 2021-01-29 DIAGNOSIS — Z5321 Procedure and treatment not carried out due to patient leaving prior to being seen by health care provider: Secondary | ICD-10-CM | POA: Insufficient documentation

## 2021-01-29 DIAGNOSIS — Z87891 Personal history of nicotine dependence: Secondary | ICD-10-CM | POA: Insufficient documentation

## 2021-01-29 DIAGNOSIS — J45909 Unspecified asthma, uncomplicated: Secondary | ICD-10-CM | POA: Insufficient documentation

## 2021-01-29 DIAGNOSIS — Z7951 Long term (current) use of inhaled steroids: Secondary | ICD-10-CM | POA: Insufficient documentation

## 2021-01-29 DIAGNOSIS — R0789 Other chest pain: Secondary | ICD-10-CM | POA: Insufficient documentation

## 2021-01-29 LAB — BASIC METABOLIC PANEL
Anion gap: 8 (ref 5–15)
BUN: 10 mg/dL (ref 6–20)
CO2: 28 mmol/L (ref 22–32)
Calcium: 9.4 mg/dL (ref 8.9–10.3)
Chloride: 104 mmol/L (ref 98–111)
Creatinine, Ser: 0.67 mg/dL (ref 0.44–1.00)
GFR, Estimated: >60 mL/min (ref >60)
Glucose, Bld: 98 mg/dL (ref 70–99)
Potassium: 4.4 mmol/L (ref 3.5–5.1)
Sodium: 140 mmol/L (ref 135–145)

## 2021-01-29 LAB — CBC
HCT: 42 % (ref 36.0–46.0)
Hemoglobin: 13.9 g/dL (ref 12.0–15.0)
MCH: 31 pg (ref 26.0–34.0)
MCHC: 33.1 g/dL (ref 30.0–36.0)
MCV: 93.5 fL (ref 80.0–100.0)
Platelets: 334 10*3/uL (ref 150–400)
RBC: 4.49 MIL/uL (ref 3.87–5.11)
RDW: 13.1 % (ref 11.5–15.5)
WBC: 9.9 10*3/uL (ref 4.0–10.5)
nRBC: 0 % (ref 0.0–0.2)

## 2021-01-29 LAB — PREGNANCY, URINE: Preg Test, Ur: NEGATIVE

## 2021-01-29 LAB — TROPONIN I (HIGH SENSITIVITY)
Troponin I (High Sensitivity): <2 ng/L (ref <18)
Troponin I (High Sensitivity): 2 ng/L (ref <18)

## 2021-01-29 MED ORDER — KETOROLAC TROMETHAMINE 30 MG/ML IJ SOLN
30.0000 mg | Freq: Once | INTRAMUSCULAR | Status: AC
  Administered 2021-01-29: 30 mg via INTRAVENOUS
  Filled 2021-01-29: qty 1

## 2021-01-29 NOTE — ED Triage Notes (Signed)
Bilateral chest pain radiating to her left arm for 2 days.  Pain to chest is "pinchy" and aching.

## 2021-01-29 NOTE — ED Notes (Signed)
Called patient to triage, unable to locate at this time.

## 2021-01-29 NOTE — ED Provider Notes (Signed)
[place] Provider Note  CSN: [id number] Arrival date & time: 01/29/21 1341    History Chief Complaint  Patient presents with   Chest Pain    Megan Chandler is a 45 y.o. 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 She reports her pain was 'more pinchy' than usual yesterday but in a similar location in R chest. Also having some pain in her L shoulder for several weeks. No significant change in her symptoms today but she became anxious and decided to come to the ED for evaluation. Initially went to [facility] but left due to wait times. She is scheduled for VQ and R heart cath at [facility] in a few weeks.    Past Medical History:  Diagnosis Date   Adenomyosis    Anemia    Anxiety    Asthma    daily inhaler use   Bartholin's cyst    hx of   Bronchitis    Endometriosis    GERD (gastroesophageal reflux disease)    Menometrorrhagia    Pneumonia 08/18/2018   Pulmonary embolism ([place])    11/2011/ states no since  anticoagulation 1/14   Sciatica     Past Surgical History:  Procedure Laterality Date   ABDOMINAL HYSTERECTOMY     BACK SURGERY     07/2011   BILATERAL SALPINGECTOMY Bilateral 10/09/2012   Procedure: BILATERAL SALPINGECTOMY;  Surgeon: Lahoma Crocker, MD;  Location: [facility];  Service: Gynecology;  Laterality: Bilateral;   CERVICAL SPINE SURGERY  2009   CESAREAN SECTION  1996   CHOLECYSTECTOMY, LAPAROSCOPIC  1998   DILATION AND CURETTAGE OF UTERUS     HYSTEROSCOPY  04/02/2012   Procedure: HYSTEROSCOPY;  Surgeon: Lahoma Crocker, MD;  Location: [facility];  Service: Gynecology;  Laterality: N/A;  with Dilitation and curretage and attempted hydrothermal ablation   LAPAROSCOPIC TUBAL LIGATION  02/12/2012   Procedure: LAPAROSCOPIC TUBAL LIGATION;  Surgeon: Lahoma Crocker, MD;   Location: [facility];  Service: Gynecology;  Laterality: Bilateral;   LAPAROSCOPY  at age 21   [place] N/A 10/09/2012   Procedure: ROBOTIC ASSISTED LAPAROSCOPIC VAGINAL HYSTERECTOMY;  Surgeon: Lahoma Crocker, MD;  Location: [facility];  Service: Gynecology;  Laterality: N/A;   TUBAL LIGATION     UMBILICAL HERNIA REPAIR     as a child    Family History  Problem Relation Age of Onset   Hypertension Mother    Cancer Maternal Aunt    Cancer Maternal Uncle    Diabetes Maternal Grandmother    Stroke Other        aunt   Sickle cell anemia Other    Anesthesia problems Neg Hx    Hypotension Neg Hx    Malignant hyperthermia Neg Hx    Pseudochol deficiency Neg Hx     Social History   Tobacco Use   Smoking status: Former    Packs/day: 0.50    Types: Cigarettes, Cigars    Start date: 07/09/1995    Quit date: 11/20/2011    Years since quitting: 9.2   Smokeless tobacco: Never   Tobacco comments:    pt quit cigarettes in 2009, pt smoke 2 black and milds a day until 09-2011  Vaping Use   Vaping Use: Never used  Substance Use Topics  Alcohol use: Yes    Alcohol/week: 0.0 standard drinks    Comment: Occasionally, "maybe 1-2 times/month"   Drug use: Yes    Frequency: 2.0 times per week    Types: Marijuana    Comment: yesterday     Home Medications Prior to Admission medications   Medication Sig Start Date End Date Taking? Authorizing Provider  albuterol (PROVENTIL) (2.5 MG/3ML) 0.083% nebulizer solution Take 3 mLs (2.5 mg total) by nebulization every 2 (two) hours as needed for wheezing or shortness of breath. 11/06/20   Hunsucker, Bonna Gains, MD  albuterol (VENTOLIN HFA) 108 (90 Base) MCG/ACT inhaler Inhale 2 puffs into the lungs every 6 (six) hours as needed for wheezing or shortness of breath. 11/06/20   Hunsucker, Bonna Gains, MD  amoxicillin-clavulanate (AUGMENTIN) 875-125 MG tablet Take 1 tablet by mouth 2 (two) times daily. 1 tablet  po BID x10 days 11/22/20   Shelly Bombard, MD  apixaban (ELIQUIS) 5 MG TABS tablet Take 1 tablet (5 mg total) by mouth 2 (two) times daily. 11/27/20   Hunsucker, Bonna Gains, MD  budesonide-formoterol (SYMBICORT) 160-4.5 MCG/ACT inhaler Inhale 2 puffs into the lungs in the morning and at bedtime. 11/06/20   Hunsucker, Bonna Gains, MD  diazepam (VALIUM) 5 MG tablet Take 5 mg by mouth daily as needed. 10/18/20   [provider]  fluconazole (DIFLUCAN) 200 MG tablet Take 1 tablet (200 mg total) by mouth every 3 (three) days. 11/22/20   Shelly Bombard, MD  metroNIDAZOLE (FLAGYL) 500 MG tablet Take 1 tablet (500 mg total) by mouth 2 (two) times daily. 11/23/20   Shelly Bombard, MD     Allergies    Morphine and related, Aspirin, Tramadol, and Vicodin [hydrocodone-acetaminophen]   Review of Systems   Review of Systems A comprehensive review of systems was completed and negative except as noted in HPI.    Physical Exam BP 133/87 (BP Location: Left Arm)   Pulse 82   Temp 99.3 F (37.4 C)   Resp 20   Ht '5\' 6"'$  (1.676 m)   Wt 77.1 kg   LMP 05/01/2012   SpO2 100%   BMI 27.44 kg/m   Physical Exam Vitals and nursing note reviewed.  Constitutional:      Appearance: Normal appearance.  HENT:     Head: Normocephalic and atraumatic.     Nose: Nose normal.     Mouth/Throat:     Mouth: Mucous membranes are moist.  Eyes:     Extraocular Movements: Extraocular movements intact.     Conjunctiva/sclera: Conjunctivae normal.  Cardiovascular:     Rate and Rhythm: Normal rate.  Pulmonary:     Effort: Pulmonary effort is normal.     Breath sounds: Normal breath sounds.  Chest:     Chest wall: No tenderness.  Abdominal:     General: Abdomen is flat.     Palpations: Abdomen is soft.     Tenderness: There is no abdominal tenderness.  Musculoskeletal:        General: No swelling. Normal range of motion.     Cervical back: Neck supple.  Skin:    General: Skin is warm and dry.   Neurological:     General: No focal deficit present.     Mental Status: She is alert.  Psychiatric:        Mood and Affect: Mood is anxious.     ED Results / Procedures / Treatments   Labs (all labs ordered are listed, but only  abnormal results are displayed) Labs Reviewed  BASIC METABOLIC PANEL  CBC  PREGNANCY, URINE  TROPONIN I (HIGH SENSITIVITY)  TROPONIN I (HIGH SENSITIVITY)    EKG EKG Interpretation  Date/Time:  Monday January 29 2021 13:58:30 EDT Ventricular Rate:  79 PR Interval:  156 QRS Duration: 74 QT Interval:  354 QTC Calculation: 405 R Axis:   79 Text Interpretation: Normal sinus rhythm T wave abnormality, consider inferior ischemia Abnormal ECG Since last tracing Nonspecific T wave abnormality Confirmed by Calvert Cantor [phone]) on 01/29/2021 4:18:35 PM   Radiology DG Chest 2 View  Result Date: 01/29/2021 CLINICAL DATA:  Bilateral chest pain radiating to LEFT arm for 2 days. EXAM: CHEST - 2 VIEW COMPARISON:  October 16, 2020. FINDINGS: Trachea is midline. Cardiomediastinal contours and hilar structures are normal, unchanged from previous imaging. Lungs are clear. No sign of pleural effusion. No pneumothorax. On limited assessment no acute skeletal process. IMPRESSION: No acute cardiopulmonary disease. Electronically Signed   By: Zetta Bills M.D.   On: 01/29/2021 14:49    Procedures Procedures  Medications Ordered in the ED Medications  ketorolac (TORADOL) 30 MG/ML injection 30 mg (30 mg Intravenous Given 01/29/21 1740)     MDM Rules/Calculators/A&P MDM Patient with pain not typical for ACS/Angina. She has known chronic PE on Eliquis so low probability of superimposed acute PE. She has had similar pains for months, worse since yesterday. Plan labs, Trop x 2 and CXR. Toradol for pain.   ED Course  I have reviewed the triage vital signs and the nursing notes.  Pertinent labs & imaging results that were available during my care of the patient were  reviewed by me and considered in my medical decision making (see chart for details).  Clinical Course as of 01/29/21 1801  Mon Jan 29, 2021  1722 CBC, BMP and Trop #1 are normal. CXR is clear [CS]  [id number] Second Trop remains normal. Will d/c with continued outpatient management of her chronic symptoms at her already established providers.  [CS]    Clinical Course User Index [CS] Truddie Hidden, MD    Final Clinical Impression(s) / ED Diagnoses Final diagnoses:  Atypical chest pain    Rx / DC Orders ED Discharge Orders     None        Truddie Hidden, MD 01/29/21 1801

## 2021-02-20 ENCOUNTER — Other Ambulatory Visit: Payer: Self-pay | Admitting: Pulmonary Disease

## 2021-02-28 ENCOUNTER — Ambulatory Visit: Payer: Medicaid Other | Admitting: Interventional Cardiology

## 2021-04-18 ENCOUNTER — Encounter: Payer: Self-pay | Admitting: Obstetrics

## 2021-04-18 ENCOUNTER — Ambulatory Visit (INDEPENDENT_AMBULATORY_CARE_PROVIDER_SITE_OTHER): Payer: Medicaid Other | Admitting: Obstetrics

## 2021-04-18 DIAGNOSIS — B3731 Acute candidiasis of vulva and vagina: Secondary | ICD-10-CM

## 2021-04-18 MED ORDER — FLUCONAZOLE 200 MG PO TABS: 200.0000 mg | ORAL_TABLET | ORAL | 2 refills | Status: DC

## 2021-04-18 NOTE — Progress Notes (Addendum)
TELEHEALTH GYNECOLOGY VISIT ENCOUNTER NOTE  Provider location: [facility] [place] [facility]   Patient location: Home  I connected with Megan Chandler on 04/18/21 at 10:35 AM EDT by telephone and verified that I am speaking with the correct person using two identifiers. Patient was unable to do MyChart audiovisual encounter due to technical difficulties, she tried several times.    I discussed the limitations, risks, security and privacy concerns of performing an evaluation and management service by telephone and the availability of in person appointments. I also discussed with the patient that there may be a patient responsible charge related to this service. The patient expressed understanding and agreed to proceed.   History:  Megan Chandler is a 45 y.o. G38P1011 female being evaluated today for vaginal discharge with itching.  Suspect yeast infection.  She denies any abnormal vaginal discharge, bleeding, pelvic pain or other concerns.       Past Medical History:  Diagnosis Date   Adenomyosis    Anemia    Anxiety    Asthma    daily inhaler use   Bartholin's cyst    hx of   Bronchitis    Endometriosis    GERD (gastroesophageal reflux disease)    Menometrorrhagia    Pneumonia 08/18/2018   Pulmonary embolism ([place])    11/2011/ states no since  anticoagulation 1/14   Sciatica    Past Surgical History:  Procedure Laterality Date   ABDOMINAL HYSTERECTOMY     BACK SURGERY     07/2011   BILATERAL SALPINGECTOMY Bilateral 10/09/2012   Procedure: BILATERAL SALPINGECTOMY;  Surgeon: Lahoma Crocker, MD;  Location: [facility];  Service: Gynecology;  Laterality: Bilateral;   CERVICAL SPINE SURGERY  2009   CESAREAN SECTION  1996   CHOLECYSTECTOMY, LAPAROSCOPIC  1998   DILATION AND CURETTAGE OF UTERUS     HYSTEROSCOPY  04/02/2012   Procedure: HYSTEROSCOPY;  Surgeon: Lahoma Crocker, MD;  Location: [facility];  Service: Gynecology;  Laterality: N/A;  with Dilitation and  curretage and attempted hydrothermal ablation   LAPAROSCOPIC TUBAL LIGATION  02/12/2012   Procedure: LAPAROSCOPIC TUBAL LIGATION;  Surgeon: Lahoma Crocker, MD;  Location: [facility];  Service: Gynecology;  Laterality: Bilateral;   LAPAROSCOPY  at age 60   [place] N/A 10/09/2012   Procedure: ROBOTIC ASSISTED LAPAROSCOPIC VAGINAL HYSTERECTOMY;  Surgeon: Lahoma Crocker, MD;  Location: [facility];  Service: Gynecology;  Laterality: N/A;   TUBAL LIGATION     UMBILICAL HERNIA REPAIR     as a child   The following portions of the patient's history were reviewed and updated as appropriate: allergies, current medications, past family history, past medical history, past social history, past surgical history and problem list.   Health Maintenance:  Normal pap and negative HRHPV on 2014 ( s/p hysterectomy in 2014 ).  Normal mammogram on unknown.   Review of Systems:  Pertinent items noted in HPI and remainder of comprehensive ROS otherwise negative.  Physical Exam:   General:  Alert, oriented and cooperative.   Mental Status: Normal mood and affect perceived. Normal judgment and thought content.  Physical exam deferred due to nature of the encounter  Labs and Imaging No results found for this or any previous visit (from the past 336 hour(s)). No results found.    Assessment and Plan:     1. Candida vaginitis Rx: - fluconazole (DIFLUCAN) 200 MG tablet; Take 1 tablet (200 mg total)  by mouth every 3 (three) days.  Dispense: 3 tablet; Refill: 2       I discussed the assessment and treatment plan with the patient. The patient was provided an opportunity to ask questions and all were answered. The patient agreed with the plan and demonstrated an understanding of the instructions.   The patient was advised to call back or seek an in-person evaluation/go to the ED if the symptoms worsen or if the condition fails to improve as anticipated.  I  have spent a total of 15 minutes of non-face-to-face time, excluding clinical staff time, reviewing notes and preparing to see patient, ordering tests and/or medications, and counseling the patient.   Baltazar Najjar, MD [facility], [facility]  04/18/21

## 2021-04-18 NOTE — Progress Notes (Signed)
Virtual Visit via Telephone Note  I connected with Megan Chandler on 04/18/21 at 10:35 AM EDT by telephone and verified that I am speaking with the correct person using two identifiers.  Pt requests treatment for yeast infection.

## 2021-07-22 ENCOUNTER — Other Ambulatory Visit: Payer: Self-pay

## 2021-07-22 ENCOUNTER — Emergency Department (HOSPITAL_BASED_OUTPATIENT_CLINIC_OR_DEPARTMENT_OTHER)
Admission: EM | Admit: 2021-07-22 | Discharge: 2021-07-23 | Disposition: A | Discharge status: Home / Self Care | Payer: Medicaid Other | Attending: Emergency Medicine | Admitting: Emergency Medicine

## 2021-07-22 ENCOUNTER — Emergency Department (HOSPITAL_BASED_OUTPATIENT_CLINIC_OR_DEPARTMENT_OTHER): Payer: Medicaid Other

## 2021-07-22 ENCOUNTER — Encounter (HOSPITAL_BASED_OUTPATIENT_CLINIC_OR_DEPARTMENT_OTHER): Payer: Self-pay | Admitting: Obstetrics and Gynecology

## 2021-07-22 DIAGNOSIS — Z86711 Personal history of pulmonary embolism: Secondary | ICD-10-CM | POA: Insufficient documentation

## 2021-07-22 DIAGNOSIS — J45909 Unspecified asthma, uncomplicated: Secondary | ICD-10-CM | POA: Insufficient documentation

## 2021-07-22 DIAGNOSIS — K625 Hemorrhage of anus and rectum: Secondary | ICD-10-CM | POA: Diagnosis not present

## 2021-07-22 DIAGNOSIS — R1032 Left lower quadrant pain: Secondary | ICD-10-CM | POA: Diagnosis present

## 2021-07-22 DIAGNOSIS — K219 Gastro-esophageal reflux disease without esophagitis: Secondary | ICD-10-CM | POA: Insufficient documentation

## 2021-07-22 DIAGNOSIS — Z7901 Long term (current) use of anticoagulants: Secondary | ICD-10-CM | POA: Diagnosis not present

## 2021-07-22 DIAGNOSIS — K6289 Other specified diseases of anus and rectum: Secondary | ICD-10-CM

## 2021-07-22 DIAGNOSIS — Z7951 Long term (current) use of inhaled steroids: Secondary | ICD-10-CM | POA: Diagnosis not present

## 2021-07-22 DIAGNOSIS — K529 Noninfective gastroenteritis and colitis, unspecified: Secondary | ICD-10-CM | POA: Diagnosis not present

## 2021-07-22 LAB — CBC
HCT: 39.9 % (ref 36.0–46.0)
Hemoglobin: 13.3 g/dL (ref 12.0–15.0)
MCH: 31.1 pg (ref 26.0–34.0)
MCHC: 33.3 g/dL (ref 30.0–36.0)
MCV: 93.2 fL (ref 80.0–100.0)
Platelets: 319 10*3/uL (ref 150–400)
RBC: 4.28 MIL/uL (ref 3.87–5.11)
RDW: 13.2 % (ref 11.5–15.5)
WBC: 10.9 10*3/uL — ABNORMAL HIGH (ref 4.0–10.5)
nRBC: 0 % (ref 0.0–0.2)

## 2021-07-22 LAB — COMPREHENSIVE METABOLIC PANEL
ALT: 19 U/L (ref 0–44)
AST: 17 U/L (ref 15–41)
Albumin: 3.9 g/dL (ref 3.5–5.0)
Alkaline Phosphatase: 76 U/L (ref 38–126)
Anion gap: 10 (ref 5–15)
BUN: 9 mg/dL (ref 6–20)
CO2: 24 mmol/L (ref 22–32)
Calcium: 9 mg/dL (ref 8.9–10.3)
Chloride: 108 mmol/L (ref 98–111)
Creatinine, Ser: 0.97 mg/dL (ref 0.44–1.00)
GFR, Estimated: >60 mL/min (ref >60)
Glucose, Bld: 90 mg/dL (ref 70–99)
Potassium: 3.4 mmol/L — ABNORMAL LOW (ref 3.5–5.1)
Sodium: 142 mmol/L (ref 135–145)
Total Bilirubin: 0.4 mg/dL (ref 0.3–1.2)
Total Protein: 6.3 g/dL — ABNORMAL LOW (ref 6.5–8.1)

## 2021-07-22 LAB — LIPASE, BLOOD: Lipase: 25 U/L (ref 11–51)

## 2021-07-22 LAB — OCCULT BLOOD X 1 CARD TO LAB, STOOL: Fecal Occult Bld: NEGATIVE

## 2021-07-22 LAB — TROPONIN I (HIGH SENSITIVITY): Troponin I (High Sensitivity): <2 ng/L (ref <18)

## 2021-07-22 MED ORDER — IOHEXOL 300 MG/ML  SOLN
100.0000 mL | Freq: Once | INTRAMUSCULAR | Status: AC | PRN
  Administered 2021-07-22: 100 mL via INTRAVENOUS

## 2021-07-22 NOTE — ED Triage Notes (Signed)
Patient repots to the ER for rectal bleeding and clots. Patient reports she is on eliquis. Patient reports she had blood in the toilet and on the tissue.

## 2021-07-22 NOTE — ED Provider Notes (Signed)
[place] EMERGENCY DEPT Provider Note   CSN: [id number] Arrival date & time: 07/22/21  2153     History  Chief Complaint  Patient presents with   Rectal Bleeding    Megan Chandler is a 46 y.o. female.  HPI     This 46 year old female with a history of PE on Eliquis, as well, reflux who presents with bright red blood per rectum.  Patient reports she has had some intermittent left lower quadrant pain.  She describes it as crampy.  She is also had at least 2 bowel movements today that have been bloody in nature.  She describes her gross blood per rectum.  Denies rectal pain.  No known history of hemorrhoids.  She denies weakness or dizziness.  No shortness of breath.  Home Medications Prior to Admission medications   Medication Sig Start Date End Date Taking? Authorizing Provider  amoxicillin-clavulanate (AUGMENTIN) 875-125 MG tablet Take 1 tablet by mouth 2 (two) times daily. 07/23/21  Yes Zebediah Beezley, Barbette Hair, MD  albuterol (PROVENTIL) (2.5 MG/3ML) 0.083% nebulizer solution Take 3 mLs (2.5 mg total) by nebulization every 2 (two) hours as needed for wheezing or shortness of breath. 11/06/20   Hunsucker, Bonna Gains, MD  albuterol (VENTOLIN HFA) 108 (90 Base) MCG/ACT inhaler Inhale 2 puffs into the lungs every 6 (six) hours as needed for wheezing or shortness of breath. 11/06/20   Hunsucker, Bonna Gains, MD  budesonide-formoterol (SYMBICORT) 160-4.5 MCG/ACT inhaler Inhale 2 puffs into the lungs in the morning and at bedtime. 11/06/20   Hunsucker, Bonna Gains, MD  diazepam (VALIUM) 5 MG tablet Take 5 mg by mouth daily as needed. 10/18/20   [provider]  ELIQUIS 5 MG TABS tablet TAKE 1 TABLET BY MOUTH 2 TIMES DAILY 02/20/21   Hunsucker, Bonna Gains, MD  fluconazole (DIFLUCAN) 200 MG tablet Take 1 tablet (200 mg total) by mouth every 3 (three) days. 04/18/21   Shelly Bombard, MD  metroNIDAZOLE (FLAGYL) 500 MG tablet Take 1 tablet (500 mg total) by mouth 2 (two) times  daily. Patient not taking: Reported on 04/18/2021 11/23/20   Shelly Bombard, MD      Allergies    Morphine and related, Aspirin, Tramadol, and Vicodin [hydrocodone-acetaminophen]    Review of Systems   Review of Systems  Constitutional:  Negative for fever.  Respiratory:  Negative for shortness of breath.   Cardiovascular:  Negative for chest pain.  Gastrointestinal:  Positive for abdominal pain and anal bleeding. Negative for nausea and vomiting.  All other systems reviewed and are negative.  Physical Exam Updated Vital Signs BP 125/88    Pulse 86    Temp 98.1 F (36.7 C)    Resp 18    LMP 05/01/2012    SpO2 98%  Physical Exam Vitals and nursing note reviewed.  Constitutional:      Appearance: She is well-developed. She is not ill-appearing.  HENT:     Head: Normocephalic and atraumatic.  Eyes:     Pupils: Pupils are equal, round, and reactive to light.  Cardiovascular:     Rate and Rhythm: Normal rate and regular rhythm.     Heart sounds: Normal heart sounds.  Pulmonary:     Effort: Pulmonary effort is normal. No respiratory distress.     Breath sounds: No wheezing.  Abdominal:     Palpations: Abdomen is soft.     Tenderness: There is no abdominal tenderness.  Genitourinary:    Comments: External nonthrombosed hemorrhoids noted, scant  blood on rectal exam Musculoskeletal:     Cervical back: Neck supple.  Skin:    General: Skin is warm and dry.  Neurological:     Mental Status: She is alert and oriented to person, place, and time.  Psychiatric:        Mood and Affect: Mood normal.    ED Results / Procedures / Treatments   Labs (all labs ordered are listed, but only abnormal results are displayed) Labs Reviewed  COMPREHENSIVE METABOLIC PANEL - Abnormal; Notable for the following components:      Result Value   Potassium 3.4 (*)    Total Protein 6.3 (*)    All other components within normal limits  CBC - Abnormal; Notable for the following components:   WBC  10.9 (*)    All other components within normal limits  URINALYSIS, ROUTINE W REFLEX MICROSCOPIC - Abnormal; Notable for the following components:   Color, Urine YELLOW (*)    Specific Gravity, Urine >1.046 (*)    Protein, ur TRACE (*)    All other components within normal limits  LIPASE, BLOOD  OCCULT BLOOD X 1 CARD TO LAB, STOOL  POC OCCULT BLOOD, ED  TROPONIN I (HIGH SENSITIVITY)  TROPONIN I (HIGH SENSITIVITY)    EKG EKG Interpretation  Date/Time:  Sunday July 22 2021 22:17:41 EST Ventricular Rate:  78 PR Interval:  152 QRS Duration: 74 QT Interval:  338 QTC Calculation: 385 R Axis:   69 Text Interpretation: Normal sinus rhythm Nonspecific T wave abnormality Abnormal ECG When compared with ECG of 29-Jan-2021 13:58, No significant change was found Confirmed by Thayer Jew [phone]) on 07/22/2021 11:28:21 PM  Radiology CT ABDOMEN PELVIS W CONTRAST  Result Date: 07/23/2021 CLINICAL DATA:  Rectal bleeding. EXAM: CT ABDOMEN AND PELVIS WITH CONTRAST TECHNIQUE: Multidetector CT imaging of the abdomen and pelvis was performed using the standard protocol following bolus administration of intravenous contrast. RADIATION DOSE REDUCTION: This exam was performed according to the departmental dose-optimization program which includes automated exposure control, adjustment of the mA and/or kV according to patient size and/or use of iterative reconstruction technique. CONTRAST:  125mL OMNIPAQUE IOHEXOL 300 MG/ML  SOLN COMPARISON:  Nov 22, 2011 FINDINGS: Lower chest: No acute abnormality. Hepatobiliary: No focal liver abnormality is seen. Status post cholecystectomy. No biliary dilatation. Pancreas: Unremarkable. No pancreatic ductal dilatation or surrounding inflammatory changes. Spleen: Normal in size without focal abnormality. Adrenals/Urinary Tract: Adrenal glands are unremarkable. Kidneys are normal, without renal calculi, focal lesion, or hydronephrosis. Bladder is unremarkable.  Stomach/Bowel: Stomach is within normal limits. Appendix appears normal. No evidence of bowel dilatation. Mild thickening of the mid to distal sigmoid colon is noted. Vascular/Lymphatic: No significant vascular findings are present. No enlarged abdominal or pelvic lymph nodes. Reproductive: Status post hysterectomy. No adnexal masses. Other: No abdominal wall hernia or abnormality. No abdominopelvic ascites. Musculoskeletal: No acute or significant osseous findings. IMPRESSION: 1. Findings which may represent mild colitis along the mid to distal sigmoid colon. 2. Evidence of prior cholecystectomy and hysterectomy. Electronically Signed   By: Virgina Norfolk M.D.   On: 07/23/2021 00:17   DG Chest Port 1 View  Result Date: 07/23/2021 CLINICAL DATA:  Rectal bleeding. EXAM: PORTABLE CHEST 1 VIEW COMPARISON:  None. FINDINGS: The heart size and mediastinal contours are within normal limits. Both lungs are clear. Radiopaque pedicle screws are seen within the mid lumbar spine. The visualized skeletal structures are otherwise unremarkable. IMPRESSION: No active cardiopulmonary disease. Electronically Signed   By: Virgina Norfolk  M.D.   On: 07/23/2021 00:13    Procedures Procedures    Medications Ordered in ED Medications  amoxicillin-clavulanate (AUGMENTIN) 875-125 MG per tablet 1 tablet (1 tablet Oral Given 07/23/21 0107)  iohexol (OMNIPAQUE) 300 MG/ML solution 100 mL (100 mLs Intravenous Contrast Given 07/22/21 2357)    ED Course/ Medical Decision Making/ A&P                           Medical Decision Making  This patient presents to the ED for concern of rectal bleeding, this involves an extensive number of treatment options, and is a complaint that carries with it a high risk of complications and morbidity.  The differential diagnosis includes hemorrhoids, colitis, fissure, diverticulitis/losis  MDM:    This 46 year old female who is on blood thinners for PE who presents with rectal bleeding.   Had 2 episodes at home.  Denies systemic symptoms to suggest symptomatic anemia.  She is nontoxic and vital signs are reassuring.  Physical exam is fairly reassuring.  While her Hemoccult was negative.  I did note a very scant amount of red blood on my glove on digital rectal exam.  No obvious bleeding or thrombosed hemorrhoid.  Given the mild left lower quadrant discomfort, will obtain CT scan.  CT obtained and reviewed and shows evidence of mild colitis.  This is likely the source.  Patient is remained hemodynamically stable.  She is afebrile.  Her hemoglobin is stable without anemia.  We will trial a course of antibiotics to cover for bacterial colitis.  I discussed with her that it is very important for her to monitor her symptoms closely.  Given that she is on blood thinners, she is at risk for increased bleed.  Given that she is not having any ongoing or massive hemorrhage on exam, do not feel she needs admission or GI consultation at this time.  I discussed this with the patient.  She is agreeable to plan (Labs, imaging)  Labs: I Ordered, and personally interpreted labs.  The pertinent results include: CBC, metabolic panel with stable hemoglobin and no significant metabolic derangement  Imaging Studies ordered: I ordered imaging studies including CT scan with I independently visualized and interpreted imaging. I agree with the radiologist interpretation  Additional history obtained from chart review.  External records from outside source obtained and reviewed including chart review  Critical Interventions: Antibiotic  Consultations: I requested consultation with the none,  and discussed lab and imaging findings as well as pertinent plan - they recommend: None  Cardiac Monitoring: The patient was maintained on a cardiac monitor.  I personally viewed and interpreted the cardiac monitored which showed an underlying rhythm of: Normal sinus rhythm  Reevaluation: After the interventions noted  above, I reevaluated the patient and found that they have :stayed the same no ongoing bleeding  Social Determinants of Health: Supportive the bedside  Disposition: Discharge with close follow-up precautions  Co morbidities that complicate the patient evaluation  Past Medical History:  Diagnosis Date   Adenomyosis    Anemia    Anxiety    Asthma    daily inhaler use   Bartholin's cyst    hx of   Bronchitis    Endometriosis    GERD (gastroesophageal reflux disease)    Menometrorrhagia    Pneumonia 08/18/2018   Pulmonary embolism ([place])    11/2011/ states no since  anticoagulation 1/14   Sciatica      Medicines Meds ordered  this encounter  Medications   iohexol (OMNIPAQUE) 300 MG/ML solution 100 mL   amoxicillin-clavulanate (AUGMENTIN) 875-125 MG per tablet 1 tablet   amoxicillin-clavulanate (AUGMENTIN) 875-125 MG tablet    Sig: Take 1 tablet by mouth 2 (two) times daily.    Dispense:  20 tablet    Refill:  0    I have reviewed the patients home medicines and have made adjustments as needed  Problem List / ED Course: Problem List Items Addressed This Visit   None Visit Diagnoses     Colitis    -  Primary   Rectal pain       Relevant Orders   DG Chest Port 1 View (Completed)   Rectal bleeding                       Final Clinical Impression(s) / ED Diagnoses Final diagnoses:  Colitis  Rectal bleeding    Rx / DC Orders ED Discharge Orders          Ordered    amoxicillin-clavulanate (AUGMENTIN) 875-125 MG tablet  2 times daily        07/23/21 0118              Noele Icenhour, Barbette Hair, MD 07/23/21 0134

## 2021-07-23 LAB — URINALYSIS, ROUTINE W REFLEX MICROSCOPIC
Bilirubin Urine: NEGATIVE
Glucose, UA: NEGATIVE mg/dL
Hgb urine dipstick: NEGATIVE
Ketones, ur: NEGATIVE mg/dL
Leukocytes,Ua: NEGATIVE
Nitrite: NEGATIVE
Specific Gravity, Urine: >1.046 — ABNORMAL HIGH (ref 1.005–1.030)
pH: 6.5 (ref 5.0–8.0)

## 2021-07-23 LAB — URINALYSIS, MICROSCOPIC (REFLEX): WBC, UA: NONE SEEN WBC/hpf (ref 0–5)

## 2021-07-23 LAB — TROPONIN I (HIGH SENSITIVITY): Troponin I (High Sensitivity): <2 ng/L (ref <18)

## 2021-07-23 MED ORDER — AMOXICILLIN-POT CLAVULANATE 875-125 MG PO TABS
1.0000 | ORAL_TABLET | Freq: Two times a day (BID) | ORAL | Status: DC
  Administered 2021-07-23: 1 via ORAL
  Filled 2021-07-23: qty 1

## 2021-07-23 MED ORDER — AMOXICILLIN-POT CLAVULANATE 875-125 MG PO TABS: 1.0000 | ORAL_TABLET | Freq: Two times a day (BID) | ORAL | 0 refills | Status: DC

## 2021-07-23 NOTE — Discharge Instructions (Addendum)
You were seen today for some rectal bleeding.  Your hemoglobin is stable which is reassuring.  Your CT scan did show evidence of colitis which is some inflammation of the colon.  You will be started on antibiotics.  Monitor your bleeding closely especially because you are on blood thinners.  If you start noting increasing bleeding or blood clots or increasing pain, you should be reevaluated.  Follow-up with gastroenterology.  Information provided.

## 2021-08-08 ENCOUNTER — Ambulatory Visit: Payer: Medicaid Other | Admitting: Pulmonary Disease

## 2021-08-27 ENCOUNTER — Telehealth: Payer: Self-pay | Admitting: Pulmonary Disease

## 2021-08-27 NOTE — Telephone Encounter (Signed)
Called patient but she did not answer. Her VM is not setup. Will call back later.

## 2021-08-27 NOTE — Telephone Encounter (Signed)
Needs to go back to ED, unable to assess over triage. Concerning symptoms.

## 2021-08-27 NOTE — Telephone Encounter (Signed)
The patient reports that she is having some headaches that are coming and going, she reports that she will have a bowel movement that she has noticed some blood in the bowel movement and she could not tell me how much other than she noticed some bright red in the toilet after a bowel movement. It does not happen all the time but she has noticed it the last couple of times she "goes to potty".   She reports some blurry vision in the right right eye at different times but it does not stay for a long period of time. She reports that she had some dizziness at times and it does not last a specific time frame. She denies any other symptoms.   She wants to get off of the Eliquis. She was in the ER on 07/22/21. Please advise if she needs to go back to the ER.

## 2021-09-14 ENCOUNTER — Other Ambulatory Visit: Payer: Self-pay

## 2021-09-14 ENCOUNTER — Encounter (HOSPITAL_COMMUNITY): Payer: Self-pay | Admitting: Internal Medicine

## 2021-09-14 ENCOUNTER — Inpatient Hospital Stay (HOSPITAL_COMMUNITY)
Admission: EM | Admit: 2021-09-14 | Discharge: 2021-09-18 | DRG: 203 | Disposition: A | Discharge status: Home / Self Care | Payer: Medicaid Other | Attending: Internal Medicine | Admitting: Internal Medicine

## 2021-09-14 ENCOUNTER — Emergency Department (HOSPITAL_COMMUNITY): Payer: Medicaid Other

## 2021-09-14 DIAGNOSIS — R0603 Acute respiratory distress: Secondary | ICD-10-CM | POA: Diagnosis present

## 2021-09-14 DIAGNOSIS — Z9049 Acquired absence of other specified parts of digestive tract: Secondary | ICD-10-CM

## 2021-09-14 DIAGNOSIS — I2782 Chronic pulmonary embolism: Secondary | ICD-10-CM

## 2021-09-14 DIAGNOSIS — Z7901 Long term (current) use of anticoagulants: Secondary | ICD-10-CM | POA: Diagnosis not present

## 2021-09-14 DIAGNOSIS — Z87891 Personal history of nicotine dependence: Secondary | ICD-10-CM

## 2021-09-14 DIAGNOSIS — F1220 Cannabis dependence, uncomplicated: Secondary | ICD-10-CM | POA: Diagnosis present

## 2021-09-14 DIAGNOSIS — I2699 Other pulmonary embolism without acute cor pulmonale: Secondary | ICD-10-CM | POA: Diagnosis present

## 2021-09-14 DIAGNOSIS — K219 Gastro-esophageal reflux disease without esophagitis: Secondary | ICD-10-CM | POA: Diagnosis present

## 2021-09-14 DIAGNOSIS — Z9079 Acquired absence of other genital organ(s): Secondary | ICD-10-CM | POA: Diagnosis not present

## 2021-09-14 DIAGNOSIS — Z90710 Acquired absence of both cervix and uterus: Secondary | ICD-10-CM

## 2021-09-14 DIAGNOSIS — R519 Headache, unspecified: Secondary | ICD-10-CM | POA: Diagnosis not present

## 2021-09-14 DIAGNOSIS — J189 Pneumonia, unspecified organism: Secondary | ICD-10-CM

## 2021-09-14 DIAGNOSIS — Z79899 Other long term (current) drug therapy: Secondary | ICD-10-CM | POA: Diagnosis not present

## 2021-09-14 DIAGNOSIS — R7989 Other specified abnormal findings of blood chemistry: Secondary | ICD-10-CM

## 2021-09-14 DIAGNOSIS — J4541 Moderate persistent asthma with (acute) exacerbation: Secondary | ICD-10-CM | POA: Diagnosis present

## 2021-09-14 DIAGNOSIS — Z86711 Personal history of pulmonary embolism: Secondary | ICD-10-CM | POA: Diagnosis not present

## 2021-09-14 DIAGNOSIS — Z886 Allergy status to analgesic agent status: Secondary | ICD-10-CM | POA: Diagnosis not present

## 2021-09-14 DIAGNOSIS — Z885 Allergy status to narcotic agent status: Secondary | ICD-10-CM | POA: Diagnosis not present

## 2021-09-14 DIAGNOSIS — Z7151 Drug abuse counseling and surveillance of drug abuser: Secondary | ICD-10-CM | POA: Diagnosis not present

## 2021-09-14 DIAGNOSIS — Z20822 Contact with and (suspected) exposure to covid-19: Secondary | ICD-10-CM | POA: Diagnosis present

## 2021-09-14 DIAGNOSIS — J45901 Unspecified asthma with (acute) exacerbation: Secondary | ICD-10-CM | POA: Diagnosis present

## 2021-09-14 DIAGNOSIS — R079 Chest pain, unspecified: Secondary | ICD-10-CM | POA: Diagnosis not present

## 2021-09-14 DIAGNOSIS — F419 Anxiety disorder, unspecified: Secondary | ICD-10-CM | POA: Diagnosis present

## 2021-09-14 DIAGNOSIS — Z7951 Long term (current) use of inhaled steroids: Secondary | ICD-10-CM | POA: Diagnosis not present

## 2021-09-14 LAB — CBC WITH DIFFERENTIAL/PLATELET
Abs Immature Granulocytes: 0.05 10*3/uL (ref 0.00–0.07)
Basophils Absolute: 0.1 10*3/uL (ref 0.0–0.1)
Basophils Relative: 1 %
Eosinophils Absolute: 0.4 10*3/uL (ref 0.0–0.5)
Eosinophils Relative: 3 %
HCT: 44.7 % (ref 36.0–46.0)
Hemoglobin: 15.2 g/dL — ABNORMAL HIGH (ref 12.0–15.0)
Immature Granulocytes: 0 %
Lymphocytes Relative: 19 %
Lymphs Abs: 2.4 10*3/uL (ref 0.7–4.0)
MCH: 31.7 pg (ref 26.0–34.0)
MCHC: 34 g/dL (ref 30.0–36.0)
MCV: 93.3 fL (ref 80.0–100.0)
Monocytes Absolute: 0.5 10*3/uL (ref 0.1–1.0)
Monocytes Relative: 4 %
Neutro Abs: 9.6 10*3/uL — ABNORMAL HIGH (ref 1.7–7.7)
Neutrophils Relative %: 73 %
Platelets: 316 10*3/uL (ref 150–400)
RBC: 4.79 MIL/uL (ref 3.87–5.11)
RDW: 13 % (ref 11.5–15.5)
WBC: 13 10*3/uL — ABNORMAL HIGH (ref 4.0–10.5)
nRBC: 0 % (ref 0.0–0.2)

## 2021-09-14 LAB — COMPREHENSIVE METABOLIC PANEL
ALT: 19 U/L (ref 0–44)
AST: 27 U/L (ref 15–41)
Albumin: 4.1 g/dL (ref 3.5–5.0)
Alkaline Phosphatase: 86 U/L (ref 38–126)
Anion gap: 11 (ref 5–15)
BUN: 7 mg/dL (ref 6–20)
CO2: 21 mmol/L — ABNORMAL LOW (ref 22–32)
Calcium: 8.6 mg/dL — ABNORMAL LOW (ref 8.9–10.3)
Chloride: 107 mmol/L (ref 98–111)
Creatinine, Ser: 0.91 mg/dL (ref 0.44–1.00)
GFR, Estimated: >60 mL/min (ref >60)
Glucose, Bld: 131 mg/dL — ABNORMAL HIGH (ref 70–99)
Potassium: 3.5 mmol/L (ref 3.5–5.1)
Sodium: 139 mmol/L (ref 135–145)
Total Bilirubin: 0.6 mg/dL (ref 0.3–1.2)
Total Protein: 7 g/dL (ref 6.5–8.1)

## 2021-09-14 LAB — BRAIN NATRIURETIC PEPTIDE: B Natriuretic Peptide: 5.9 pg/mL (ref 0.0–100.0)

## 2021-09-14 LAB — LACTIC ACID, PLASMA
Lactic Acid, Venous: 3.6 mmol/L (ref 0.5–1.9)
Lactic Acid, Venous: 4.4 mmol/L (ref 0.5–1.9)

## 2021-09-14 LAB — RESP PANEL BY RT-PCR (FLU A&B, COVID) ARPGX2
Influenza A by PCR: NEGATIVE
Influenza B by PCR: NEGATIVE
SARS Coronavirus 2 by RT PCR: NEGATIVE

## 2021-09-14 LAB — TROPONIN I (HIGH SENSITIVITY): Troponin I (High Sensitivity): 4 ng/L (ref <18)

## 2021-09-14 MED ORDER — SODIUM CHLORIDE 0.9 % IV SOLN
500.0000 mg | Freq: Once | INTRAVENOUS | Status: AC
  Administered 2021-09-14: 500 mg via INTRAVENOUS
  Filled 2021-09-14: qty 5

## 2021-09-14 MED ORDER — PREDNISONE 20 MG PO TABS
40.0000 mg | ORAL_TABLET | Freq: Every day | ORAL | Status: DC
  Administered 2021-09-15: 40 mg via ORAL
  Filled 2021-09-14: qty 2

## 2021-09-14 MED ORDER — GUAIFENESIN ER 600 MG PO TB12
600.0000 mg | ORAL_TABLET | Freq: Two times a day (BID) | ORAL | Status: DC | PRN
  Administered 2021-09-17: 600 mg via ORAL
  Filled 2021-09-14: qty 1

## 2021-09-14 MED ORDER — DIAZEPAM 5 MG PO TABS
5.0000 mg | ORAL_TABLET | Freq: Every day | ORAL | Status: DC
Start: 2021-09-14 — End: 2021-09-18
  Administered 2021-09-14 – 2021-09-17 (×4): 5 mg via ORAL
  Filled 2021-09-14 (×4): qty 1

## 2021-09-14 MED ORDER — APIXABAN 5 MG PO TABS
5.0000 mg | ORAL_TABLET | Freq: Two times a day (BID) | ORAL | Status: DC
  Administered 2021-09-14 – 2021-09-18 (×8): 5 mg via ORAL
  Filled 2021-09-14 (×8): qty 1

## 2021-09-14 MED ORDER — ALBUTEROL SULFATE (2.5 MG/3ML) 0.083% IN NEBU: 2.5000 mg | INHALATION_SOLUTION | RESPIRATORY_TRACT | Status: DC | PRN

## 2021-09-14 MED ORDER — POLYETHYLENE GLYCOL 3350 17 G PO PACK: 17.0000 g | PACK | Freq: Every day | ORAL | Status: DC | PRN

## 2021-09-14 MED ORDER — SODIUM CHLORIDE 0.9 % IV BOLUS
1000.0000 mL | Freq: Once | INTRAVENOUS | Status: AC
  Administered 2021-09-14: 1000 mL via INTRAVENOUS

## 2021-09-14 MED ORDER — IPRATROPIUM BROMIDE 0.02 % IN SOLN
0.5000 mg | Freq: Once | RESPIRATORY_TRACT | Status: AC
  Administered 2021-09-14: 0.5 mg via RESPIRATORY_TRACT
  Filled 2021-09-14: qty 2.5

## 2021-09-14 MED ORDER — METHYLPREDNISOLONE SODIUM SUCC 125 MG IJ SOLR
120.0000 mg | Freq: Once | INTRAMUSCULAR | Status: AC
  Administered 2021-09-14: 120 mg via INTRAVENOUS
  Filled 2021-09-14: qty 2

## 2021-09-14 MED ORDER — ONDANSETRON HCL 4 MG/2ML IJ SOLN: 4.0000 mg | Freq: Four times a day (QID) | INTRAMUSCULAR | Status: DC | PRN

## 2021-09-14 MED ORDER — ZOLPIDEM TARTRATE 5 MG PO TABS
5.0000 mg | ORAL_TABLET | Freq: Every evening | ORAL | Status: DC | PRN
  Administered 2021-09-17: 5 mg via ORAL
  Filled 2021-09-14: qty 1

## 2021-09-14 MED ORDER — ALBUTEROL SULFATE (2.5 MG/3ML) 0.083% IN NEBU
5.0000 mg | INHALATION_SOLUTION | Freq: Once | RESPIRATORY_TRACT | Status: AC
  Administered 2021-09-14: 5 mg via RESPIRATORY_TRACT
  Filled 2021-09-14: qty 6

## 2021-09-14 MED ORDER — ACETAMINOPHEN 325 MG PO TABS
650.0000 mg | ORAL_TABLET | Freq: Four times a day (QID) | ORAL | Status: DC | PRN
  Administered 2021-09-14 – 2021-09-17 (×4): 650 mg via ORAL
  Filled 2021-09-14 (×6): qty 2

## 2021-09-14 MED ORDER — LORAZEPAM 2 MG/ML IJ SOLN
0.5000 mg | Freq: Once | INTRAMUSCULAR | Status: AC
  Administered 2021-09-14: 0.5 mg via INTRAVENOUS
  Filled 2021-09-14: qty 1

## 2021-09-14 MED ORDER — BISACODYL 5 MG PO TBEC: 5.0000 mg | DELAYED_RELEASE_TABLET | Freq: Every day | ORAL | Status: DC | PRN

## 2021-09-14 MED ORDER — SODIUM CHLORIDE 0.9 % IV SOLN
1.0000 g | Freq: Once | INTRAVENOUS | Status: AC
  Administered 2021-09-14: 1 g via INTRAVENOUS
  Filled 2021-09-14: qty 10

## 2021-09-14 MED ORDER — SODIUM CHLORIDE 0.9% FLUSH
3.0000 mL | Freq: Two times a day (BID) | INTRAVENOUS | Status: DC
  Administered 2021-09-14 – 2021-09-17 (×5): 3 mL via INTRAVENOUS

## 2021-09-14 MED ORDER — SODIUM CHLORIDE 0.9 % IV SOLN
2.0000 g | Freq: Three times a day (TID) | INTRAVENOUS | Status: DC
  Administered 2021-09-14 – 2021-09-17 (×7): 2 g via INTRAVENOUS
  Filled 2021-09-14 (×7): qty 2

## 2021-09-14 MED ORDER — ONDANSETRON HCL 4 MG PO TABS: 4.0000 mg | ORAL_TABLET | Freq: Four times a day (QID) | ORAL | Status: DC | PRN

## 2021-09-14 MED ORDER — ACETAMINOPHEN 650 MG RE SUPP: 650.0000 mg | Freq: Four times a day (QID) | RECTAL | Status: DC | PRN

## 2021-09-14 MED ORDER — IPRATROPIUM-ALBUTEROL 0.5-2.5 (3) MG/3ML IN SOLN
3.0000 mL | Freq: Four times a day (QID) | RESPIRATORY_TRACT | Status: DC
  Administered 2021-09-14 – 2021-09-18 (×16): 3 mL via RESPIRATORY_TRACT
  Filled 2021-09-14 (×15): qty 3

## 2021-09-14 MED ORDER — DOCUSATE SODIUM 100 MG PO CAPS
100.0000 mg | ORAL_CAPSULE | Freq: Two times a day (BID) | ORAL | Status: DC
  Administered 2021-09-14: 100 mg via ORAL
  Filled 2021-09-14 (×7): qty 1

## 2021-09-14 MED ORDER — HYDRALAZINE HCL 20 MG/ML IJ SOLN: 5.0000 mg | INTRAMUSCULAR | Status: DC | PRN

## 2021-09-14 MED ORDER — MOMETASONE FURO-FORMOTEROL FUM 200-5 MCG/ACT IN AERO
2.0000 | INHALATION_SPRAY | Freq: Two times a day (BID) | RESPIRATORY_TRACT | Status: DC
  Administered 2021-09-15 – 2021-09-18 (×7): 2 via RESPIRATORY_TRACT
  Filled 2021-09-14 (×2): qty 8.8

## 2021-09-14 NOTE — Assessment & Plan Note (Addendum)
-  She has history of recurrent infections having been recently treated with both Augmentin and Doxy with ?improvement between episodes ?-Now presenting with respiratory distress and placed on BIPAP on arrival;   ?-Despite this severe and recurrent pulmonary issue, she continues to smoke marijuana daily ?-CXR negative for PNA; no other apparent viral infection; elevated WBC is likely related to acute stress response  ?-will admit to inpatient given need for BIPAP on arrival and recurrent recent antibiotic failure ?-Nebulizers: prn albuterol and Duoneb q6h ?-Solu-Medrol 60 mg IV BID -> prednisone ?-IV Cefepime ?-Continue home Symbicort (Dulera per formulary) ?

## 2021-09-14 NOTE — ED Notes (Signed)
Called RT to come to bedside ?

## 2021-09-14 NOTE — ED Triage Notes (Signed)
Pt BIB [facility] from urgent care d/t respiratory distress. Pt was confirmed having pneumonia at [facility]. Pt received 4 neb, '125mg'$  solumedrol. HX asthma, copd and recurrent pneumonia. A&O X4.  ?

## 2021-09-14 NOTE — Assessment & Plan Note (Signed)
-  She does appear somewhat anxious and reports that [facility] use is for anxiolytic purposes ?-She reports qhs Valium on med rec but PDMP lists #30 pills prescribed on 05/14/21 as last rx ?-Will give Valium qhs while hospitalized but would consider SSRI as an outpatient ?

## 2021-09-14 NOTE — ED Notes (Signed)
Lactic 3.6. EDP aware.  ?

## 2021-09-14 NOTE — ED Provider Notes (Signed)
[facility] EM[facility]GENC[facility] DEPARTMENT Provider Note   CSN: [id number] Arrival date & time: 09/14/21  1133     History  Chief Complaint  Patient presents with   Respiratory Distress    Megan Chandler is a 46 y.o. female.  Patient with hx asthma, c/o non prod cough, sob. Symptoms progressive for the past few weeks. States had pna 2-3x in the past couple months, indicates had been on augmentin, and more recently doxycycline. States feels warm now, but unaware of fever in past few days. Denies chest pain. No pleuritic pain. +sob. Denies leg pain or swelling. Notes remote hx PE and indicates she has been compliant w eliquis therapy. Former smoker. Went to urgent care today, was given solumedrol, and neb tx, and was felt to have pna on cxr and sent to ED. No known covid or flu exposure.   The history is provided by the patient, medical records and the EMS personnel.      Home Medications Prior to Admission medications   Medication Sig Start Date End Date Taking? Authorizing Provider  albuterol (PROVENTIL) (2.5 MG/3ML) 0.083% nebulizer solution Take 3 mLs (2.5 mg total) by nebulization every 2 (two) hours as needed for wheezing or shortness of breath. 11/06/20   Hunsucker, Bonna Gains, MD  albuterol (VENTOLIN HFA) 108 (90 Base) MCG/ACT inhaler Inhale 2 puffs into the lungs every 6 (six) hours as needed for wheezing or shortness of breath. 11/06/20   Hunsucker, Bonna Gains, MD  amoxicillin-clavulanate (AUGMENTIN) 875-125 MG tablet Take 1 tablet by mouth 2 (two) times daily. 07/23/21   Horton, Barbette Hair, MD  budesonide-formoterol (SYMBICORT) 160-4.5 MCG/ACT inhaler Inhale 2 puffs into the lungs in the morning and at bedtime. 11/06/20   Hunsucker, Bonna Gains, MD  diazepam (VALIUM) 5 MG tablet Take 5 mg by mouth daily as needed. 10/18/20   [provider]  ELIQUIS 5 MG TABS tablet TAKE 1 TABLET BY MOUTH 2 TIMES DAILY 02/20/21   Hunsucker, Bonna Gains, MD  fluconazole (DIFLUCAN) 200 MG  tablet Take 1 tablet (200 mg total) by mouth every 3 (three) days. 04/18/21   Shelly Bombard, MD  metroNIDAZOLE (FLAGYL) 500 MG tablet Take 1 tablet (500 mg total) by mouth 2 (two) times daily. Patient not taking: Reported on 04/18/2021 11/23/20   Shelly Bombard, MD      Allergies    Morphine and related, Aspirin, Tramadol, and Vicodin [hydrocodone-acetaminophen]    Review of Systems   Review of Systems  Constitutional:  Negative for chills and fever.  HENT:  Negative for sore throat.   Eyes:  Negative for redness.  Respiratory:  Positive for cough, shortness of breath and wheezing.   Cardiovascular:  Negative for chest pain and leg swelling.  Gastrointestinal:  Negative for abdominal pain and vomiting.  Genitourinary:  Negative for dysuria.  Musculoskeletal:  Negative for back pain and neck pain.  Skin:  Negative for rash.  Neurological:  Negative for headaches.  Hematological:  Does not bruise/bleed easily.  Psychiatric/Behavioral:  Negative for confusion.    Physical Exam Updated Vital Signs LMP 05/01/2012    SpO2 100%  Physical Exam Vitals and nursing note reviewed.  Constitutional:      General: She is in acute distress.     Appearance: She is well-developed.  HENT:     Head: Atraumatic.     Nose: Nose normal.     Mouth/Throat:     Mouth: Mucous membranes are moist.  Eyes:  General: No scleral icterus.    Conjunctiva/sclera: Conjunctivae normal.  Neck:     Trachea: No tracheal deviation.     Comments: Thyroid not grossly enlarged or tender.  Cardiovascular:     Rate and Rhythm: Regular rhythm. Tachycardia present.     Pulses: Normal pulses.     Heart sounds: Normal heart sounds. No murmur heard.   No friction rub. No gallop.  Pulmonary:     Effort: Respiratory distress present.     Breath sounds: Wheezing present.  Abdominal:     General: Bowel sounds are normal. There is no distension.     Palpations: Abdomen is soft.     Tenderness: There is no  abdominal tenderness. There is no guarding.  Genitourinary:    Comments: No cva tenderness.  Musculoskeletal:        General: No swelling or tenderness.     Cervical back: Normal range of motion and neck supple. No rigidity. No muscular tenderness.     Right lower leg: No edema.     Left lower leg: No edema.  Skin:    General: Skin is warm and dry.     Findings: No rash.  Neurological:     Mental Status: She is alert.     Comments: Alert, speech normal.   Psychiatric:        Mood and Affect: Mood normal.    ED Results / Procedures / Treatments   Labs (all labs ordered are listed, but only abnormal results are displayed) Results for orders placed or performed during the hospital encounter of 09/14/21  Resp Panel by RT-PCR (Flu A&B, Covid) Nasopharyngeal Swab   Specimen: Nasopharyngeal Swab; Nasopharyngeal(NP) swabs in vial transport medium  Result Value Ref Range   SARS Coronavirus 2 by RT PCR NEGATIVE NEGATIVE   Influenza A by PCR NEGATIVE NEGATIVE   Influenza B by PCR NEGATIVE NEGATIVE  Lactic acid, plasma  Result Value Ref Range   Lactic Acid, Venous 3.6 (HH) 0.5 - 1.9 mmol/L  Brain natriuretic peptide  Result Value Ref Range   B Natriuretic Peptide 5.9 0.0 - 100.0 pg/mL  CBC with Differential/Platelet  Result Value Ref Range   WBC 13.0 (H) 4.0 - 10.5 K/uL   RBC 4.79 3.87 - 5.11 MIL/uL   Hemoglobin 15.2 (H) 12.0 - 15.0 g/dL   HCT 44.7 36.0 - 46.0 %   MCV 93.3 80.0 - 100.0 fL   MCH 31.7 26.0 - 34.0 pg   MCHC 34.0 30.0 - 36.0 g/dL   RDW 13.0 11.5 - 15.5 %   Platelets 316 150 - 400 K/uL   nRBC 0.0 0.0 - 0.2 %   Neutrophils Relative % 73 %   Neutro Abs 9.6 (H) 1.7 - 7.7 K/uL   Lymphocytes Relative 19 %   Lymphs Abs 2.4 0.7 - 4.0 K/uL   Monocytes Relative 4 %   Monocytes Absolute 0.5 0.1 - 1.0 K/uL   Eosinophils Relative 3 %   Eosinophils Absolute 0.4 0.0 - 0.5 K/uL   Basophils Relative 1 %   Basophils Absolute 0.1 0.0 - 0.1 K/uL   Immature Granulocytes 0 %   Abs  Immature Granulocytes 0.05 0.00 - 0.07 K/uL  Comprehensive metabolic panel  Result Value Ref Range   Sodium 139 135 - 145 mmol/L   Potassium 3.5 3.5 - 5.1 mmol/L   Chloride 107 98 - 111 mmol/L   CO2 21 (L) 22 - 32 mmol/L   Glucose, Bld 131 (H) 70 - 99 mg/dL  BUN 7 6 - 20 mg/dL   Creatinine, Ser 0.91 0.44 - 1.00 mg/dL   Calcium 8.6 (L) 8.9 - 10.3 mg/dL   Total Protein 7.0 6.5 - 8.1 g/dL   Albumin 4.1 3.5 - 5.0 g/dL   AST 27 15 - 41 U/L   ALT 19 0 - 44 U/L   Alkaline Phosphatase 86 38 - 126 U/L   Total Bilirubin 0.6 0.3 - 1.2 mg/dL   GFR, Estimated >60 >60 mL/min   Anion gap 11 5 - 15  Troponin I (High Sensitivity)  Result Value Ref Range   Troponin I (High Sensitivity) 4 <18 ng/L     EKG EKG Interpretation  Date/Time:  Friday September 14 2021 11:53:14 EST Ventricular Rate:  121 PR Interval:  148 QRS Duration: 82 QT Interval:  381 QTC Calculation: 541 R Axis:   73 Text Interpretation: Sinus tachycardia Non-specific ST-t changes Prolonged QT interval Baseline wander Confirmed by Lajean Saver [phone]) on 09/14/2021 11:55:40 AM  Radiology DG Chest Port 1 View  Result Date: 09/14/2021 CLINICAL DATA:  Cough and shortness of breath. Respiratory distress. EXAM: PORTABLE CHEST 1 VIEW COMPARISON:  07/22/2021 FINDINGS: Markedly reverse lordotic projection. Low lung volumes are present, causing crowding of the pulmonary vasculature. Heart size within normal limits. No discrete airspace opacity or blunting of the costophrenic angles. Lower cervical plate and screw fixator. IMPRESSION: 1. No acute thoracic findings. 2. Low lung volumes. 3. Substantially reverse lordotic projection. Electronically Signed   By: Van Clines M.D.   On: 09/14/2021 12:05    Procedures Procedures    Medications Ordered in ED Medications  albuterol (PROVENTIL) (2.5 MG/3ML) 0.083% nebulizer solution 5 mg (has no administration in time range)  ipratropium (ATROVENT) nebulizer solution 0.5 mg (has no  administration in time range)    ED Course/ Medical Decision Making/ A&P                           Medical Decision Making Problems Addressed: Elevated lactic acid level: acute illness or injury that poses a threat to life or bodily functions Moderate persistent asthma with exacerbation: acute illness or injury with systemic symptoms that poses a threat to life or bodily functions Recurrent pneumonia: acute illness or injury that poses a threat to life or bodily functions Respiratory distress: acute illness or injury with systemic symptoms that poses a threat to life or bodily functions  Amount and/or Complexity of Data Reviewed Independent Historian: EMS    Details: additional hx External Data Reviewed: radiology and notes. Labs: ordered. Decision-making details documented in ED Course. Radiology: ordered and independent interpretation performed. Decision-making details documented in ED Course. ECG/medicine tests: ordered and independent interpretation performed. Decision-making details documented in ED Course. Discussion of management or test interpretation with external provider(s): Hospitalists - discussed pt, labs, cxr. Will admit.   Risk Prescription drug management. Decision regarding hospitalization.  Iv ns. Continuous pulse ox and cardiac monitoring. Labs ordered/sent.  Imaging ordered.  Differential dx considered incl pneumonia, acs, sepsis, asthma exacerbation.   Reviewed nursing notes and prior charts for additional history. Additional hx from charts/uc, and EMS.  Labs reviewed/interpreted by me - wbc mildly high. Trop normal. Lactate high. Ns bolus. Iv antibiotics after cultures sent.   Cardiac monitor ; sinus tachy rate 115.   CXR reviewed/interpreted by me - no def pna.   Pt given ativan iv for anxiety. Reassurance provided.  Additional neb txs.   Improved air movement. Decreased wheezing.  Hospitalists consulted for admission, discussed pt - will admit.    Repeat lactate pending.   Recheck pt, no chest pain or discomfort. Pt indicates has been compliant w her eliquis and other medical therapy.          Final Clinical Impression(s) / ED Diagnoses Final diagnoses:  None    Rx / DC Orders ED Discharge Orders     None         Lajean Saver, MD 09/14/21 1413

## 2021-09-14 NOTE — Assessment & Plan Note (Signed)
-  Cessation encouraged; this should be encouraged on an ongoing basis ?-UDS ordered ?

## 2021-09-14 NOTE — ED Notes (Signed)
MD at bedside. 

## 2021-09-14 NOTE — ED Notes (Signed)
RT at bedside.

## 2021-09-14 NOTE — Assessment & Plan Note (Signed)
-  Remote initial clot but chronic thrombus was appreciated on imaging in 11/2020 ?-Continue Eliquis ?

## 2021-09-14 NOTE — H&P (Signed)
History and Physical    Patient: Megan Chandler:[id number] DOB: Sep 08, 1975 DOA: 09/14/2021 DOS: the patient was seen and examined on 09/14/2021 PCP: Leighton Ruff, MD  Patient coming from: Home - lives with boyfriend; NOK: Mother, Rivka Safer, [phone]   Chief Complaint: SOB  HPI: Megan Chandler is a 46 y.o. female with medical history significant of PE on Eliquis and asthma presenting with respiratory distress (sent from [facility]).  In early February, she started feeling bad.  She went to her doctor and she was diagnosed with PNA and given abx and a steroid pack.  She took them all and a couple of weeks later she had another cold and cough.  She went to her doctor again and she was again diagnosed with something "but it wasn't pneumonia."  She got antibiotics again.  She thought she was better again but started feeling bad again this week.  +wheezing.  +cough.  No fever, + chills.  Her kids are "always sick."    ER Course:  Recent PNA, on Augmentin.  Saw doctor again and still had cough, changed to doxy.  Went to UC today, given nebs and steroids and diagnosed with multifocal PNA.  Wheezing, tachycardia here.  Improving with treatments.  CXR without obvious infiltrates.  Lactate 3.6, giving IVF.  WBC 13.  Cultures pending.  IV abx.  EKG and troponin normal.  COVID/flu negative.  Asthma exacerbation +/- PNA.  Remote h/o PE, on Eliquis and reports compliance.     Review of Systems: As mentioned in the history of present illness. All other systems reviewed and are negative. Past Medical History:  Diagnosis Date   Adenomyosis    Anemia    Anxiety    Asthma    daily inhaler use   Bartholin's cyst    hx of   Bronchitis    Endometriosis    GERD (gastroesophageal reflux disease)    Menometrorrhagia    Pneumonia 08/18/2018   Pulmonary embolism ([place])    11/2011/ states no since  anticoagulation 1/14   Sciatica    Past Surgical History:  Procedure Laterality Date   ABDOMINAL  HYSTERECTOMY     BACK SURGERY     07/2011   BILATERAL SALPINGECTOMY Bilateral 10/09/2012   Procedure: BILATERAL SALPINGECTOMY;  Surgeon: Lahoma Crocker, MD;  Location: [facility];  Service: Gynecology;  Laterality: Bilateral;   CERVICAL SPINE SURGERY  2009   CESAREAN SECTION  1996   CHOLECYSTECTOMY, LAPAROSCOPIC  1998   DILATION AND CURETTAGE OF UTERUS     HYSTEROSCOPY  04/02/2012   Procedure: HYSTEROSCOPY;  Surgeon: Lahoma Crocker, MD;  Location: [facility];  Service: Gynecology;  Laterality: N/A;  with Dilitation and curretage and attempted hydrothermal ablation   LAPAROSCOPIC TUBAL LIGATION  02/12/2012   Procedure: LAPAROSCOPIC TUBAL LIGATION;  Surgeon: Lahoma Crocker, MD;  Location: [facility];  Service: Gynecology;  Laterality: Bilateral;   LAPAROSCOPY  at age 64   [place] N/A 10/09/2012   Procedure: ROBOTIC ASSISTED LAPAROSCOPIC VAGINAL HYSTERECTOMY;  Surgeon: Lahoma Crocker, MD;  Location: [facility];  Service: Gynecology;  Laterality: N/A;   TUBAL LIGATION     UMBILICAL HERNIA REPAIR     as a child   Social History:  reports that she quit smoking about 9 years ago. Her smoking use included cigarettes and cigars. She started smoking about 26 years ago. She smoked an average of .5 packs per day. She has never used  smokeless tobacco. She reports current alcohol use. She reports current drug use. Drug: Marijuana.  Allergies  Allergen Reactions   Morphine And Related Anaphylaxis    Pt can take dilaudid   Zyrtec [Cetirizine] Other (See Comments)    Makes allergy - sneezing watery eyes - worse   Aspirin Itching    Pt can tolerate ibuprofen   Tramadol Itching   Vicodin [Hydrocodone-Acetaminophen] Itching    Pt can take percocet    Family History  Problem Relation Age of Onset   Hypertension Mother    Cancer Maternal Aunt    Cancer Maternal Uncle    Diabetes Maternal Grandmother    Stroke Other        aunt   Sickle cell  anemia Other    Anesthesia problems Neg Hx    Hypotension Neg Hx    Malignant hyperthermia Neg Hx    Pseudochol deficiency Neg Hx     Prior to Admission medications   Medication Sig Start Date End Date Taking? Authorizing Provider  albuterol (PROVENTIL) (2.5 MG/3ML) 0.083% nebulizer solution Take 3 mLs (2.5 mg total) by nebulization every 2 (two) hours as needed for wheezing or shortness of breath. 11/06/20   Hunsucker, Bonna Gains, MD  albuterol (VENTOLIN HFA) 108 (90 Base) MCG/ACT inhaler Inhale 2 puffs into the lungs every 6 (six) hours as needed for wheezing or shortness of breath. 11/06/20   Hunsucker, Bonna Gains, MD  amoxicillin-clavulanate (AUGMENTIN) 875-125 MG tablet Take 1 tablet by mouth 2 (two) times daily. 07/23/21   Horton, Barbette Hair, MD  budesonide-formoterol (SYMBICORT) 160-4.5 MCG/ACT inhaler Inhale 2 puffs into the lungs in the morning and at bedtime. 11/06/20   Hunsucker, Bonna Gains, MD  diazepam (VALIUM) 5 MG tablet Take 5 mg by mouth daily as needed. 10/18/20   [provider]  ELIQUIS 5 MG TABS tablet TAKE 1 TABLET BY MOUTH 2 TIMES DAILY 02/20/21   Hunsucker, Bonna Gains, MD  fluconazole (DIFLUCAN) 200 MG tablet Take 1 tablet (200 mg total) by mouth every 3 (three) days. 04/18/21   Shelly Bombard, MD  metroNIDAZOLE (FLAGYL) 500 MG tablet Take 1 tablet (500 mg total) by mouth 2 (two) times daily. Patient not taking: Reported on 04/18/2021 11/23/20   Shelly Bombard, MD    Physical Exam: Vitals:   09/14/21 1334 09/14/21 1430 09/14/21 1508 09/14/21 1649  BP: 126/75 125/83    Pulse: (!) 115 (!) 114 (!) 110 (!) 108  Resp: (!) 23  (!) 24 18  Temp:      TempSrc:      SpO2: 98% 96% 100% 96%   General:  Appears  disheveled and mildly hystrionic Eyes:  PERRL, EOMI, normal lids, iris; artificial eyelashes are poorly attached and exaggerated ENT:  grossly normal hearing, lips & tongue, mmm Neck:  no LAD, masses or thyromegaly Cardiovascular:  RR with mild tachycardia, no  m/r/g. No LE edema.  Respiratory:   Diffuse wheezing with moderate air movement.  Mildly increased respiratory effort. Abdomen:  soft, NT, ND Back:   normal alignment, no CVAT Skin:  no rash or induration seen on limited exam Musculoskeletal:  grossly normal tone BUE/BLE, good ROM, no bony abnormality Psychiatric:  mildly hystrionic/anxious mood and affect, speech fluent and appropriate, AOx3 Neurologic:  CN 2-12 grossly intact, moves all extremities in coordinated fashion   Radiological Exams on Admission: Independently reviewed - see discussion in A/P where applicable  DG Chest Port 1 View  Result Date: 09/14/2021 CLINICAL DATA:  Cough  and shortness of breath. Respiratory distress. EXAM: PORTABLE CHEST 1 VIEW COMPARISON:  07/22/2021 FINDINGS: Markedly reverse lordotic projection. Low lung volumes are present, causing crowding of the pulmonary vasculature. Heart size within normal limits. No discrete airspace opacity or blunting of the costophrenic angles. Lower cervical plate and screw fixator. IMPRESSION: 1. No acute thoracic findings. 2. Low lung volumes. 3. Substantially reverse lordotic projection. Electronically Signed   By: Van Clines M.D.   On: 09/14/2021 12:05    EKG: Independently reviewed.  Sinus tachycardia with rate 121; prolonged QTc 541; nonspecific ST changes with no evidence of acute ischemia   Labs on Admission: I have personally reviewed the available labs and imaging studies at the time of the admission.  Pertinent labs:    CO2 21 Glucose 131 Lactate 3.6 WBC 13 COVID/flu negative    Assessment and Plan: * Asthma exacerbation -She has history of recurrent infections having been recently treated with both Augmentin and Doxy with ?improvement between episodes -Now presenting with respiratory distress and placed on BIPAP on arrival;   -Despite this severe and recurrent pulmonary issue, she continues to smoke marijuana daily -CXR negative for PNA; no other  apparent viral infection; elevated WBC is likely related to acute stress response  -will admit to inpatient given need for BIPAP on arrival and recurrent recent antibiotic failure -Nebulizers: prn albuterol and Duoneb q6h -Solu-Medrol 60 mg IV BID -> prednisone -IV Cefepime -Continue home Symbicort (Dulera per formulary)  Marijuana dependence ([place]) -Cessation encouraged; this should be encouraged on an ongoing basis -UDS ordered  Anxiety -She does appear somewhat anxious and reports that [facility] use is for anxiolytic purposes -She reports qhs Valium on med rec but PDMP lists #30 pills prescribed on 05/14/21 as last rx -Will give Valium qhs while hospitalized but would consider SSRI as an outpatient  Pulmonary embolus ([place]) -Remote initial clot but chronic thrombus was appreciated on imaging in 11/2020 -Continue Eliquis       Advance Care Planning:   Code Status: Full Code   Consults: RT; TOC team  DVT Prophylaxis: Eliquis  Family Communication: None present; she is capable of communicating with her family at this time  Severity of Illness: The appropriate patient status for this patient is INPATIENT. Inpatient status is judged to be reasonable and necessary in order to provide the required intensity of service to ensure the patient's safety. The patient's presenting symptoms, physical exam findings, and initial radiographic and laboratory data in the context of their chronic comorbidities is felt to place them at high risk for further clinical deterioration. Furthermore, it is not anticipated that the patient will be medically stable for discharge from the hospital within 2 midnights of admission.   * I certify that at the point of admission it is my clinical judgment that the patient will require inpatient hospital care spanning beyond 2 midnights from the point of admission due to high intensity of service, high risk for further deterioration and high frequency of surveillance  required.*  Author: Karmen Bongo, MD 09/14/2021 5:04 PM  For on call review [email or web address].

## 2021-09-15 DIAGNOSIS — F419 Anxiety disorder, unspecified: Secondary | ICD-10-CM | POA: Diagnosis not present

## 2021-09-15 DIAGNOSIS — J4541 Moderate persistent asthma with (acute) exacerbation: Secondary | ICD-10-CM | POA: Diagnosis not present

## 2021-09-15 LAB — CBC
HCT: 40.1 % (ref 36.0–46.0)
Hemoglobin: 13.9 g/dL (ref 12.0–15.0)
MCH: 31.5 pg (ref 26.0–34.0)
MCHC: 34.7 g/dL (ref 30.0–36.0)
MCV: 90.9 fL (ref 80.0–100.0)
Platelets: 261 10*3/uL (ref 150–400)
RBC: 4.41 MIL/uL (ref 3.87–5.11)
RDW: 13.1 % (ref 11.5–15.5)
WBC: 7.2 10*3/uL (ref 4.0–10.5)
nRBC: 0 % (ref 0.0–0.2)

## 2021-09-15 LAB — BASIC METABOLIC PANEL
Anion gap: 8 (ref 5–15)
BUN: 6 mg/dL (ref 6–20)
CO2: 21 mmol/L — ABNORMAL LOW (ref 22–32)
Calcium: 9.3 mg/dL (ref 8.9–10.3)
Chloride: 111 mmol/L (ref 98–111)
Creatinine, Ser: 0.7 mg/dL (ref 0.44–1.00)
GFR, Estimated: >60 mL/min (ref >60)
Glucose, Bld: 136 mg/dL — ABNORMAL HIGH (ref 70–99)
Potassium: 3.5 mmol/L (ref 3.5–5.1)
Sodium: 140 mmol/L (ref 135–145)

## 2021-09-15 LAB — HIV ANTIBODY (ROUTINE TESTING W REFLEX): HIV Screen 4th Generation wRfx: NONREACTIVE

## 2021-09-15 MED ORDER — OXYCODONE-ACETAMINOPHEN 5-325 MG PO TABS: 1.0000 | ORAL_TABLET | Freq: Three times a day (TID) | ORAL | Status: DC | PRN

## 2021-09-15 MED ORDER — BUTALBITAL-APAP-CAFFEINE 50-325-40 MG PO TABS
1.0000 | ORAL_TABLET | Freq: Four times a day (QID) | ORAL | Status: DC | PRN
  Administered 2021-09-15 – 2021-09-17 (×5): 1 via ORAL
  Filled 2021-09-15 (×5): qty 1

## 2021-09-15 MED ORDER — METHYLPREDNISOLONE SODIUM SUCC 125 MG IJ SOLR
60.0000 mg | Freq: Every day | INTRAMUSCULAR | Status: DC
  Administered 2021-09-15 – 2021-09-17 (×3): 60 mg via INTRAVENOUS
  Filled 2021-09-15 (×3): qty 2

## 2021-09-15 NOTE — Progress Notes (Signed)
? ? ? [facility] ?                                                                            ? ? ?Megan Chandler, is a 46 y.o. female, DOB - Oct 01, 1975, [id number] ?Admit date - 09/14/2021    ?Outpatient Primary MD for the patient is Megan Ruff, MD ? ?LOS - 1  days ? ? ? ?Brief summary  ? ? Megan Chandler is a 46 y.o. female with medical history significant of PE on Eliquis and asthma presenting with respiratory distress (sent from [facility]). ? ? ?Assessment & Plan  ? ? ?Assessment and Plan: ?* Asthma exacerbation ?Improving.  ?Still with sig wheezing on exam.  ?Start her on IV solumedrol 60 mg daily.  ?Pt reports that she was recently treated for pneumonia twice, her CXR does not show pneumonia. But she has rhonchi and reports sob and cough.  ?Will get CT chest for further eval, depending on which , will narrow antibiotics soon.  ? ?Marijuana dependence ([place]) ?-Cessation encouraged; this should be encouraged on an ongoing basis ?-UDS ordered ? ?Anxiety ?Much improved.  ? ?Pulmonary embolus ([place]) ?-Remote initial clot but chronic thrombus was appreciated on imaging in 11/2020 ?-Continue Eliquis ? ?Headache:  ?Fiorcet ordered. As she is allergic to vicodin, morphine .  ? ?  ? ?Estimated body mass index is 26.6 kg/m? as calculated from the following: ?  Height as of 01/29/21: '5\' 6"'$  (1.676 m). ?  Weight as of this encounter: 74.8 kg. ? ?Code Status: full code.  ?DVT Prophylaxis:   ?apixaban (ELIQUIS) tablet 5 mg  ? ?Level of Care: Level of care: Telemetry Medical ?Family Communication: none at bedside. ? ?Disposition Plan:     IV steroids, inpatient ? ?Procedures:  ?None.  ? ?Consultants:   ?None.  ? ?Antimicrobials:  ? ?Anti-infectives (From admission, onward)  ? ? Start     Dose/Rate Route Frequency Ordered Stop  ? 09/14/21 1800  ceFEPIme (MAXIPIME) 2 g in sodium chloride 0.9 % 100 mL IVPB       ? 2 g ?200 mL/hr over 30 Minutes Intravenous Every 8 hours 09/14/21 1553 09/19/21 2159  ? 09/14/21 1300   cefTRIAXone (ROCEPHIN) 1 g in sodium chloride 0.9 % 100 mL IVPB       ? 1 g ?200 mL/hr over 30 Minutes Intravenous  Once 09/14/21 1253 09/14/21 1400  ? 09/14/21 1300  azithromycin (ZITHROMAX) 500 mg in sodium chloride 0.9 % 250 mL IVPB       ? 500 mg ?250 mL/hr over 60 Minutes Intravenous  Once 09/14/21 1253 09/14/21 1429  ? ?  ? ? ? ?Medications ? ?Scheduled Meds: ? apixaban  5 mg Oral BID  ? diazepam  5 mg Oral QHS  ? docusate sodium  100 mg Oral BID  ? ipratropium-albuterol  3 mL Nebulization Q6H  ? methylPREDNISolone (SOLU-MEDROL) injection  60 mg Intravenous Daily  ? mometasone-formoterol  2 puff Inhalation BID  ? sodium chloride flush  3 mL Intravenous Q12H  ? ?Continuous Infusions: ? ceFEPime (MAXIPIME) IV 2 g (09/15/21 1254)  ? ?PRN Meds:.acetaminophen **OR** acetaminophen, albuterol, bisacodyl, butalbital-acetaminophen-caffeine, guaiFENesin, hydrALAZINE, ondansetron **OR** ondansetron (ZOFRAN) IV,  polyethylene glycol, zolpidem ? ? ? ?Subjective:  ? ?Megan Chandler was seen and examined today.  Reports severe headache. ? ?Objective:  ? ?Vitals:  ? 09/15/21 0738 09/15/21 1044 09/15/21 1400 09/15/21 1522  ?BP:  (!) 137/95  127/76  ?Pulse: 86 96 (!) 110 93  ?Resp: (!) 23 (!) 21 (!) 28 (!) 24  ?Temp:  98.4 ?F (36.9 ?C)  97.9 ?F (36.6 ?C)  ?TempSrc:  Oral  Oral  ?SpO2: 97% 96% 96% 96%  ?Weight:      ? ? ?Intake/Output Summary (Last 24 hours) at 09/15/2021 1816 ?Last data filed at 09/15/2021 1307 ?Gross per 24 hour  ?Intake 1061.44 ml  ?Output 1000 ml  ?Net 61.44 ml  ? ?Filed Weights  ? 09/15/21 0346  ?Weight: 74.8 kg  ? ? ? ?Exam ?General exam: Appears calm and comfortable  ?Respiratory system: bilateral exp wheezing heard with scattered rhonchi at bases.  ?Cardiovascular system: S1 & S2 heard, RRR. No JVD, No pedal edema. ?Gastrointestinal system: Abdomen is nondistended, soft and nontender. Normal bowel sounds heard. ?Central nervous system: Alert and oriented. No focal neurological deficits. ?Extremities:  Symmetric 5 x 5 power. ?Skin: No rashes, lesions or ulcers ?Psychiatry: Mood & affect appropriate.  ? ? ? ?Data Reviewed:  I have personally reviewed following labs and imaging studies ? ? ?CBC ?Lab Results  ?Component Value Date  ? WBC 7.2 09/15/2021  ? RBC 4.41 09/15/2021  ? HGB 13.9 09/15/2021  ? HCT 40.1 09/15/2021  ? MCV 90.9 09/15/2021  ? MCH 31.5 09/15/2021  ? PLT 261 09/15/2021  ? MCHC 34.7 09/15/2021  ? RDW 13.1 09/15/2021  ? LYMPHSABS 2.4 09/14/2021  ? MONOABS 0.5 09/14/2021  ? EOSABS 0.4 09/14/2021  ? BASOSABS 0.1 09/14/2021  ? ? ? ?Last metabolic panel ?Lab Results  ?Component Value Date  ? NA 140 09/15/2021  ? K 3.5 09/15/2021  ? CL 111 09/15/2021  ? CO2 21 (L) 09/15/2021  ? BUN 6 09/15/2021  ? CREATININE 0.70 09/15/2021  ? GLUCOSE 136 (H) 09/15/2021  ? GFRNONAA >60 09/15/2021  ? GFRAA >60 08/19/2018  ? CALCIUM 9.3 09/15/2021  ? PHOS 3.5 11/20/2011  ? PROT 7.0 09/14/2021  ? ALBUMIN 4.1 09/14/2021  ? LABGLOB 2.6 01/01/2017  ? AGRATIO 1.7 01/01/2017  ? BILITOT 0.6 09/14/2021  ? ALKPHOS 86 09/14/2021  ? AST 27 09/14/2021  ? ALT 19 09/14/2021  ? ANIONGAP 8 09/15/2021  ? ? ?CBG (last 3)  ?No results for input(s): GLUCAP in the last 72 hours.  ? ? ?Coagulation Profile: ?No results for input(s): INR, PROTIME in the last 168 hours. ? ? ?Radiology Studies: ?DG Chest Port 1 View ? ?Result Date: 09/14/2021 ?CLINICAL DATA:  Cough and shortness of breath. Respiratory distress. EXAM: PORTABLE CHEST 1 VIEW COMPARISON:  07/22/2021 FINDINGS: Markedly reverse lordotic projection. Low lung volumes are present, causing crowding of the pulmonary vasculature. Heart size within normal limits. No discrete airspace opacity or blunting of the costophrenic angles. Lower cervical plate and screw fixator. IMPRESSION: 1. No acute thoracic findings. 2. Low lung volumes. 3. Substantially reverse lordotic projection. Electronically Signed   By: Van Clines M.D.   On: 09/14/2021 12:05   ? ? ? ? ?Hosie Poisson M.D. ?[facility] ?09/15/2021, 6:16 PM ? ?Available via Epic secure chat 7am-7pm ?After 7 pm, please refer to night coverage provider listed on amion. ? ? ? ?

## 2021-09-16 ENCOUNTER — Inpatient Hospital Stay (HOSPITAL_COMMUNITY): Payer: Medicaid Other

## 2021-09-16 DIAGNOSIS — J4541 Moderate persistent asthma with (acute) exacerbation: Secondary | ICD-10-CM | POA: Diagnosis not present

## 2021-09-16 DIAGNOSIS — F419 Anxiety disorder, unspecified: Secondary | ICD-10-CM | POA: Diagnosis not present

## 2021-09-16 MED ORDER — IOHEXOL 300 MG/ML  SOLN
100.0000 mL | Freq: Once | INTRAMUSCULAR | Status: AC | PRN
  Administered 2021-09-16: 100 mL via INTRAVENOUS

## 2021-09-16 MED ORDER — IOHEXOL 350 MG/ML SOLN
100.0000 mL | Freq: Once | INTRAVENOUS | Status: DC | PRN
Start: 2021-09-16 — End: 2021-09-18

## 2021-09-16 MED ORDER — ADULT MULTIVITAMIN W/MINERALS CH
1.0000 | ORAL_TABLET | Freq: Every day | ORAL | Status: DC
  Administered 2021-09-16 – 2021-09-18 (×3): 1 via ORAL
  Filled 2021-09-16 (×3): qty 1

## 2021-09-16 MED ORDER — ENSURE ENLIVE PO LIQD
237.0000 mL | Freq: Two times a day (BID) | ORAL | Status: DC
  Administered 2021-09-16 – 2021-09-18 (×3): 237 mL via ORAL

## 2021-09-16 NOTE — Progress Notes (Signed)
? ? ? [facility] ?                                                                            ? ? ?Megan Chandler, is a 46 y.o. female, DOB - [birth year], [id number] ?Admit date - 09/14/2021    ?Outpatient Primary MD for the patient is Leighton Ruff, MD ? ?LOS - 2  days ? ? ? ?Brief summary  ? ? Megan Chandler is a 46 y.o. female with medical history significant of PE on Eliquis and asthma presenting with respiratory distress (sent from UC). ? ? ?Assessment & Plan  ? ? ?Assessment and Plan: ?* Asthma exacerbation ?Improving.  ?Still with sig wheezing on exam.  ?Start her on IV solumedrol 60 mg daily.  Continue for another day.  ?Pt reports that she was recently treated for pneumonia twice int he last tow months, her CXR does not show pneumonia. But she has rhonchi and reports sob and cough.  ?Will get CT chest for further eval, depending on which , will narrow antibiotics soon.  ? ?Marijuana dependence ([place]) ?-Cessation encouraged; this should be encouraged on an ongoing basis ?-UDS ordered ? ?Anxiety ?Much improved.  ? ?Pulmonary embolus ([place]) ?-Remote initial clot but chronic thrombus was appreciated on imaging in 11/2020 ?-Continue Eliquis ? ?Headache:  ?Fiorcet ordered. As she is allergic to vicodin, morphine . Improved but not resolved.  ? ?Interventions: Refer to RD note for recommendations ? ?Estimated body mass index is 26.55 kg/m? as calculated from the following: ?  Height as of 01/29/21: '5\' 6"'$  (1.676 m). ?  Weight as of this encounter: 74.6 kg. ? ?Code Status: full code.  ?DVT Prophylaxis:   ?apixaban (ELIQUIS) tablet 5 mg  ? ?Level of Care: Level of care: Telemetry Medical ?Family Communication: none at bedside. ? ?Disposition Plan:     IV steroids, inpatient ? ?Procedures:  ?None.  ? ?Consultants:   ?None.  ? ?Antimicrobials:  ? ?Anti-infectives (From admission, onward)  ? ? Start     Dose/Rate Route Frequency Ordered Stop  ? 09/14/21 1800  ceFEPIme (MAXIPIME) 2 g in sodium chloride 0.9  % 100 mL IVPB       ? 2 g ?200 mL/hr over 30 Minutes Intravenous Every 8 hours 09/14/21 1553 09/19/21 2159  ? 09/14/21 1300  cefTRIAXone (ROCEPHIN) 1 g in sodium chloride 0.9 % 100 mL IVPB       ? 1 g ?200 mL/hr over 30 Minutes Intravenous  Once 09/14/21 1253 09/14/21 1400  ? 09/14/21 1300  azithromycin (ZITHROMAX) 500 mg in sodium chloride 0.9 % 250 mL IVPB       ? 500 mg ?250 mL/hr over 60 Minutes Intravenous  Once 09/14/21 1253 09/14/21 1429  ? ?  ? ? ? ?Medications ? ?Scheduled Meds: ? apixaban  5 mg Oral BID  ? diazepam  5 mg Oral QHS  ? docusate sodium  100 mg Oral BID  ? feeding supplement  237 mL Oral BID BM  ? ipratropium-albuterol  3 mL Nebulization Q6H  ? methylPREDNISolone (SOLU-MEDROL) injection  60 mg Intravenous Daily  ? mometasone-formoterol  2 puff Inhalation BID  ? multivitamin with minerals  1 tablet Oral  Daily  ? sodium chloride flush  3 mL Intravenous Q12H  ? ?Continuous Infusions: ? ceFEPime (MAXIPIME) IV 2 g (09/16/21 6060)  ? ?PRN Meds:.acetaminophen **OR** acetaminophen, albuterol, bisacodyl, butalbital-acetaminophen-caffeine, guaiFENesin, hydrALAZINE, ondansetron **OR** ondansetron (ZOFRAN) IV, polyethylene glycol, zolpidem ? ? ? ?Subjective:  ? ?Megan Chandler was seen and examined today.  Headache is improving.  ?No chest pain  ? ?Objective:  ? ?Vitals:  ? 09/16/21 0512 09/16/21 0746 09/16/21 0753 09/16/21 1104  ?BP: 111/82  (!) 130/91 (!) 162/91  ?Pulse:  88 89   ?Resp: 19 (!) 29 (!) 22 20  ?Temp: 98 ?F (36.7 ?C)  97.7 ?F (36.5 ?C) 98.3 ?F (36.8 ?C)  ?TempSrc: Oral  Oral Oral  ?SpO2:  100% 100% 97%  ?Weight: 74.6 kg     ? ? ?Intake/Output Summary (Last 24 hours) at 09/16/2021 1342 ?Last data filed at 09/16/2021 1108 ?Gross per 24 hour  ?Intake 837 ml  ?Output --  ?Net 837 ml  ? ? ?Filed Weights  ? 09/15/21 0346 09/16/21 0512  ?Weight: 74.8 kg 74.6 kg  ? ? ? ?Exam ?General exam: Appears calm and comfortable  ?Respiratory system: Bilateral expiratory wheezing .  ?Cardiovascular system: S1 &  S2 heard, RRR. No JVD, No pedal edema. ?Gastrointestinal system: Abdomen is nondistended, soft and nontender. Normal bowel sounds heard. ?Central nervous system: Alert and oriented. No focal neurological deficits. ?Extremities: Symmetric 5 x 5 power. ?Skin: No rashes, lesions or ulcers ?Psychiatry:  Mood & affect appropriate.  ? ? ? ? ?Data Reviewed:  I have personally reviewed following labs and imaging studies ? ? ?CBC ?Lab Results  ?Component Value Date  ? WBC 7.2 09/15/2021  ? RBC 4.41 09/15/2021  ? HGB 13.9 09/15/2021  ? HCT 40.1 09/15/2021  ? MCV 90.9 09/15/2021  ? MCH 31.5 09/15/2021  ? PLT 261 09/15/2021  ? MCHC 34.7 09/15/2021  ? RDW 13.1 09/15/2021  ? LYMPHSABS 2.4 09/14/2021  ? MONOABS 0.5 09/14/2021  ? EOSABS 0.4 09/14/2021  ? BASOSABS 0.1 09/14/2021  ? ? ? ?Last metabolic panel ?Lab Results  ?Component Value Date  ? NA 140 09/15/2021  ? K 3.5 09/15/2021  ? CL 111 09/15/2021  ? CO2 21 (L) 09/15/2021  ? BUN 6 09/15/2021  ? CREATININE 0.70 09/15/2021  ? GLUCOSE 136 (H) 09/15/2021  ? GFRNONAA >60 09/15/2021  ? GFRAA >60 08/19/2018  ? CALCIUM 9.3 09/15/2021  ? PHOS 3.5 11/20/2011  ? PROT 7.0 09/14/2021  ? ALBUMIN 4.1 09/14/2021  ? LABGLOB 2.6 01/01/2017  ? AGRATIO 1.7 01/01/2017  ? BILITOT 0.6 09/14/2021  ? ALKPHOS 86 09/14/2021  ? AST 27 09/14/2021  ? ALT 19 09/14/2021  ? ANIONGAP 8 09/15/2021  ? ? ?CBG (last 3)  ?No results for input(s): GLUCAP in the last 72 hours.  ? ? ?Coagulation Profile: ?No results for input(s): INR, PROTIME in the last 168 hours. ? ? ?Radiology Studies: ?No results found. ? ? ? ? ?Hosie Poisson M.D. ?[facility] ?09/16/2021, 1:42 PM ? ?Available via Epic secure chat 7am-7pm ?After 7 pm, please refer to night coverage provider listed on amion. ? ? ? ?

## 2021-09-16 NOTE — Progress Notes (Signed)
Initial Nutrition Assessment ? ?DOCUMENTATION CODES:  ?Not applicable ? ?INTERVENTION:  ?Continue current diet as ordered, encourage PO intake ?Ensure Enlive po BID, each supplement provides 350 kcal and 20 grams of protein. ?MVI with minerals daily ? ?NUTRITION DIAGNOSIS:  ?Inadequate oral intake related to decreased appetite as evidenced by per patient/family report, meal completion < 50%. ? ?GOAL:  ?Patient will meet greater than or equal to 90% of their needs ? ?MONITOR:  ?PO intake, Supplement acceptance ? ?REASON FOR ASSESSMENT:  ?Malnutrition Screening Tool ?  ? ?ASSESSMENT:  ?46 y.o. female with history of PE on Eliquis, GERD, and asthma sent to ED from UC with respiratory distress ongoing for about a month. Found to have an asthma exacerbation and requiring BiPAP on admission. ? ?Pt reported poor appetite and weight loss on RN admission screen. Reviewed weight hx, weight appears overall stable for several years with gain noted in the last 12 months. Intake trends this admission do suggest a decreased appetite. Pt reporting significant headache, which could be a contributing factor.  ? ?Will add nutrition supplements to augment intake until oral intake improves.  ? ?Average Meal Intake: ?3/11-3/12: 55% average intake x 3 recorded meals ? ?Nutritionally Relevant Medications: ?Scheduled Meds: ? docusate sodium  100 mg Oral BID  ? methylPREDNISolone  60 mg Intravenous Daily  ? ?Continuous Infusions: ? ceFEPime (MAXIPIME) IV 2 g (09/16/21 6004)  ? ?PRN Meds: bisacodyl, ondansetron, polyethylene glycol ? ?Labs Reviewed ? ?NUTRITION - FOCUSED PHYSICAL EXAM: ?Defer to in-person assessment ? ?Diet Order:   ?Diet Order   ? ?       ?  Diet regular Room service appropriate? Yes; Fluid consistency: Thin  Diet effective now       ?  ? ?  ?  ? ?  ? ? ?EDUCATION NEEDS:  ?No education needs have been identified at this time ? ?Skin:  Skin Assessment: Reviewed RN Assessment ? ?Last BM:  3/12 ? ?Height:  ?Ht Readings from  Last 1 Encounters:  ?01/29/21 '5\' 6"'$  (1.676 m)  ? ? ?Weight:  ?Wt Readings from Last 1 Encounters:  ?09/16/21 74.6 kg  ? ? ?Ideal Body Weight:  59.1 kg ? ?BMI:  Body mass index is 26.55 kg/m?. ? ?Estimated Nutritional Needs:  ?Kcal:  1700-1900 kcal/d ?Protein:  85-100 g/d ?Fluid:  >/= 1.8L/d ? ? ?Ranell Patrick, RD, LDN ?Clinical Dietitian ?RD pager # available in [place]  ?After hours/weekend pager # available in [place] ?

## 2021-09-17 ENCOUNTER — Inpatient Hospital Stay (HOSPITAL_COMMUNITY): Payer: Medicaid Other

## 2021-09-17 DIAGNOSIS — R079 Chest pain, unspecified: Secondary | ICD-10-CM

## 2021-09-17 LAB — TROPONIN I (HIGH SENSITIVITY)
Troponin I (High Sensitivity): 4 ng/L (ref <18)
Troponin I (High Sensitivity): 5 ng/L (ref <18)

## 2021-09-17 LAB — LACTIC ACID, PLASMA
Lactic Acid, Venous: 2.2 mmol/L (ref 0.5–1.9)
Lactic Acid, Venous: 1.9 mmol/L (ref 0.5–1.9)

## 2021-09-17 LAB — ECHOCARDIOGRAM COMPLETE
AR max vel: 2.23 cm2
AV Area VTI: 2.14 cm2
AV Area mean vel: 2.16 cm2
AV Mean grad: 3 mmHg
AV Peak grad: 6 mmHg
Ao pk vel: 1.22 m/s
Area-P 1/2: 1.7 cm2
S' Lateral: 3.2 cm
Weight: 2644.8 oz

## 2021-09-17 MED ORDER — PREDNISONE 20 MG PO TABS
40.0000 mg | ORAL_TABLET | Freq: Every day | ORAL | Status: DC
  Administered 2021-09-18: 40 mg via ORAL
  Filled 2021-09-17: qty 2

## 2021-09-17 MED ORDER — GUAIFENESIN-DM 100-10 MG/5ML PO SYRP: 15.0000 mL | ORAL_SOLUTION | ORAL | Status: DC | PRN

## 2021-09-17 MED ORDER — AMOXICILLIN-POT CLAVULANATE 875-125 MG PO TABS
1.0000 | ORAL_TABLET | Freq: Two times a day (BID) | ORAL | Status: DC
  Administered 2021-09-17 – 2021-09-18 (×3): 1 via ORAL
  Filled 2021-09-17 (×3): qty 1

## 2021-09-17 MED ORDER — NITROGLYCERIN 0.4 MG SL SUBL
SUBLINGUAL_TABLET | SUBLINGUAL | Status: AC
  Filled 2021-09-17: qty 1

## 2021-09-17 NOTE — TOC Progression Note (Signed)
Transition of Care (TOC) - Progression Note  ? ? ?Patient Details  ?Name: Megan Chandler ?MRN: [id number] ?Date of Birth: [birth year] ? ?Transition of Care (TOC) CM/SW Contact  ?Coralee Pesa, LCSWA ?Phone Number: ?09/17/2021, 11:12 AM ? ?Clinical Narrative:    ?CSW was consulted for a new face mask for a pt's nebulizer at home. CSW confirmed with [employer] that they can ship it and patient is agreeable. Order put in for mask. TOC will continue to follow for DC needs. ? ? ?  ?  ? ?Expected Discharge Plan and Services ?  ?  ?  ?  ?  ?                ?  ?  ?  ?  ?  ?  ?  ?  ?  ?  ? ? ?Social Determinants of Health (SDOH) Interventions ?  ? ?Readmission Risk Interventions ?No flowsheet data found. ? ?

## 2021-09-17 NOTE — Progress Notes (Signed)
2D echocardiogram completed. ? ?09/17/2021 4:40 PM ?Kelby Aline., MHA, RVT, RDCS, RDMS   ?

## 2021-09-17 NOTE — Progress Notes (Signed)
Mobility Specialist Progress Note: ? ? 09/17/21 1120  ?Mobility  ?Activity Ambulated with assistance in hallway  ?Level of Assistance Standby assist, set-up cues, supervision of patient - no hands on  ?Assistive Device None  ?Distance Ambulated (ft) 350 ft  ?Activity Response Tolerated fair  ?$Mobility charge 1 Mobility  ? ?Pt agreeable to ambulation. Displayed extreme SOB upon exertion, with an audible wheeze. SpO2 93-98% throughout on RA. Pt required x1 seated rest break d/t SOB. Pt left sitting EOB with all needs met.  ? ?Nelta Numbers ?Acute Rehab ?Phone: [phone] ?Office Phone: [phone] ? ?

## 2021-09-17 NOTE — Progress Notes (Signed)
[facility]                                                                               Megan Chandler, is a 46 y.o. female, DOB - [birth year], XTA:[id number] Admit date - 09/14/2021    Outpatient Primary MD for the patient is Christa See, Johnell Comings, MD  LOS - 3  days    Brief summary    Megan Chandler is a 46 y.o. female with medical history significant of PE on Eliquis and asthma presenting with respiratory distress (sent from [facility]).   Assessment & Plan    Assessment and Plan: * Asthma exacerbation Improving.  Wheezing has improved. Transition to oral steroids , monitor on oral steroids overnight and possible d/c in am. Pt reports that she was recently treated for pneumonia twice int he last tow months, her CXR does not show pneumonia. But she has rhonchi and reports sob and cough.  CT chest with contrast does not show any pneumonia, will transition to oral antibiotics.   Marijuana dependence ([place]) -Cessation encouraged; this should be encouraged on an ongoing basis -UDS ordered  Anxiety Much improved.   Pulmonary embolus (HCC) -Remote initial clot but chronic thrombus was appreciated on imaging in 11/2020 -Continue Eliquis  Headache:  Fiorcet ordered. As she is allergic to vicodin, morphine . Improved but not resolved.    Chest pain: atypical, worsened with cough.  EKG shows t wave inversions in the lateral leads similar in appearance to EKG in June 2022.  Echocardiogram ordered and pending.  Troponin is negative.   Interventions: Refer to RD note for recommendations  Estimated body mass index is 26.68 kg/m as calculated from the following:   Height as of 01/29/21: '5\' 6"'$  (1.676 m).   Weight as of this encounter: 75 kg.  Code Status: full code.  DVT Prophylaxis:   apixaban (ELIQUIS) tablet 5 mg   Level of Care: Level of care: Telemetry Medical Family Communication: none at bedside.  Disposition Plan:     IV steroids, inpatient  Procedures:   None.   Consultants:   None.   Antimicrobials:   Anti-infectives (From admission, onward)    Start     Dose/Rate Route Frequency Ordered Stop   09/17/21 1015  amoxicillin-clavulanate (AUGMENTIN) 875-125 MG per tablet 1 tablet        1 tablet Oral Every 12 hours 09/17/21 0924     09/14/21 1800  ceFEPIme (MAXIPIME) 2 g in sodium chloride 0.9 % 100 mL IVPB  Status:  Discontinued        2 g 200 mL/hr over 30 Minutes Intravenous Every 8 hours 09/14/21 1553 09/17/21 0924   09/14/21 1300  cefTRIAXone (ROCEPHIN) 1 g in sodium chloride 0.9 % 100 mL IVPB        1 g 200 mL/hr over 30 Minutes Intravenous  Once 09/14/21 1253 09/14/21 1400   09/14/21 1300  azithromycin (ZITHROMAX) 500 mg in sodium chloride 0.9 % 250 mL IVPB        500 mg 250 mL/hr over 60 Minutes Intravenous  Once 09/14/21 1253 09/14/21 1429        Medications  Scheduled Meds:  amoxicillin-clavulanate  1 tablet Oral Q12H   apixaban  5 mg Oral BID   diazepam  5 mg Oral QHS   docusate sodium  100 mg Oral BID   feeding supplement  237 mL Oral BID BM   ipratropium-albuterol  3 mL Nebulization Q6H   mometasone-formoterol  2 puff Inhalation BID   multivitamin with minerals  1 tablet Oral Daily   [START ON 09/18/2021] predniSONE  40 mg Oral QAC breakfast   sodium chloride flush  3 mL Intravenous Q12H   Continuous Infusions:   PRN Meds:.acetaminophen **OR** acetaminophen, albuterol, bisacodyl, butalbital-acetaminophen-caffeine, guaiFENesin-dextromethorphan, hydrALAZINE, iohexol, ondansetron **OR** ondansetron (ZOFRAN) IV, polyethylene glycol, zolpidem    Subjective:   Megan Chandler was seen and examined today. Chest pain, substernal, non radiating, associated with cough.   Objective:   Vitals:   09/17/21 0829 09/17/21 0830 09/17/21 1008 09/17/21 1347  BP: 131/83 131/83 118/86   Pulse: (!) 124 (!) 119 90   Resp: (!) 27 15 (!) 21   Temp:   98.4 F (36.9 C)   TempSrc:   Oral   SpO2: 99% 98% 95% 97%  Weight:         Intake/Output Summary (Last 24 hours) at 09/17/2021 1749 Last data filed at 09/17/2021 1303 Gross per 24 hour  Intake 954 ml  Output 500 ml  Net 454 ml    Filed Weights   09/15/21 0346 09/16/21 0512 09/17/21 0435  Weight: 74.8 kg 74.6 kg 75 kg     Exam General exam: Appears calm and comfortable  Respiratory system: wheezing has improved. On RA.  Cardiovascular system: S1 & S2 heard, RRR. No JVD,  No pedal edema. Gastrointestinal system: Abdomen is nondistended, soft and nontender. No organomegaly or masses felt. Normal bowel sounds heard. Central nervous system: Alert and oriented. No focal neurological deficits. Extremities: Symmetric 5 x 5 power. Skin: No rashes, lesions or ulcers Psychiatry: Judgement and insight appear normal. Mood & affect appropriate.       Data Reviewed:  I have personally reviewed following labs and imaging studies   CBC Lab Results  Component Value Date   WBC 7.2 09/15/2021   RBC 4.41 09/15/2021   HGB 13.9 09/15/2021   HCT 40.1 09/15/2021   MCV 90.9 09/15/2021   MCH 31.5 09/15/2021   PLT 261 09/15/2021   MCHC 34.7 09/15/2021   RDW 13.1 09/15/2021   LYMPHSABS 2.4 09/14/2021   MONOABS 0.5 09/14/2021   EOSABS 0.4 09/14/2021   BASOSABS 0.1 27/74/1287     Last metabolic panel Lab Results  Component Value Date   NA 140 09/15/2021   K 3.5 09/15/2021   CL 111 09/15/2021   CO2 21 (L) 09/15/2021   BUN 6 09/15/2021   CREATININE 0.70 09/15/2021   GLUCOSE 136 (H) 09/15/2021   GFRNONAA >60 09/15/2021   GFRAA >60 08/19/2018   CALCIUM 9.3 09/15/2021   PHOS 3.5 11/20/2011   PROT 7.0 09/14/2021   ALBUMIN 4.1 09/14/2021   LABGLOB 2.6 01/01/2017   AGRATIO 1.7 01/01/2017   BILITOT 0.6 09/14/2021   ALKPHOS 86 09/14/2021   AST 27 09/14/2021   ALT 19 09/14/2021   ANIONGAP 8 09/15/2021    CBG (last 3)  No results for input(s): GLUCAP in the last 72 hours.    Coagulation Profile: No results for input(s): INR, PROTIME in the last 168  hours.   Radiology Studies: CT CHEST W CONTRAST  Result Date: 09/16/2021 CLINICAL DATA:  Recurrent pneumonia EXAM: CT CHEST WITH CONTRAST TECHNIQUE: Multidetector CT  imaging of the chest was performed during intravenous contrast administration. RADIATION DOSE REDUCTION: This exam was performed according to the departmental dose-optimization program which includes automated exposure control, adjustment of the mA and/or kV according to patient size and/or use of iterative reconstruction technique. CONTRAST:  165m OMNIPAQUE IOHEXOL 300 MG/ML  SOLN COMPARISON:  10/16/2020 FINDINGS: Cardiovascular: No significant vascular findings. Normal heart size. No pericardial effusion. Mediastinum/Nodes: No enlarged mediastinal, hilar, or axillary lymph nodes. Thyroid gland, trachea, and esophagus demonstrate no significant findings. Lungs/Pleura: Minimal paraseptal emphysema. Bandlike scarring of the left lung base. No acute appearing airspace opacity. No pleural effusion or pneumothorax. Upper Abdomen: No acute abnormality. Hepatic steatosis. Status post cholecystectomy. Musculoskeletal: No chest wall abnormality. No suspicious osseous lesions identified. IMPRESSION: 1. No acute appearing airspace opacity. Bandlike scarring of the left lung base. 2. Minimal paraseptal emphysema. 3. Hepatic steatosis. Emphysema (ICD10-J43.9). Electronically Signed   By: ADelanna AhmadiM.D.   On: 09/16/2021 20:53   ECHOCARDIOGRAM COMPLETE  Result Date: 09/17/2021    ECHOCARDIOGRAM REPORT   Patient Name:   Megan LINSKEYDate of Exam: 09/17/2021 Medical Rec #:  [id number]        Height:       66.0 in Accession #:    [id number]       Weight:       165.3 lb Date of Birth:  [birth year]        BSA:          1.844 m Patient Age:    89years          BP:           118/86 mmHg Patient Gender: F                 HR:           85 bpm. Exam Location:  Inpatient Procedure: 2D Echo, Cardiac Doppler and Color Doppler Indications:    Chest pain   History:        Patient has prior history of Echocardiogram examinations, most                 recent 11/20/2020. Risk Factors:No documented risk factors.  Sonographer:    MMaudry MayhewMHA, RDMS, RVT, RDCS Referring Phys: 4[place] 1. Left ventricular ejection fraction, by estimation, is 60 to 65%. The left ventricle has normal function. The left ventricle has no regional wall motion abnormalities. Left ventricular diastolic parameters were normal.  2. Right ventricular systolic function is normal. The right ventricular size is normal. There is normal pulmonary artery systolic pressure.  3. The mitral valve is normal in structure. Trivial mitral valve regurgitation. No evidence of mitral stenosis.  4. The aortic valve is normal in structure. Aortic valve regurgitation is not visualized. No aortic stenosis is present.  5. The inferior vena cava is normal in size with greater than 50% respiratory variability, suggesting right atrial pressure of 3 mmHg. FINDINGS  Left Ventricle: Left ventricular ejection fraction, by estimation, is 60 to 65%. The left ventricle has normal function. The left ventricle has no regional wall motion abnormalities. The left ventricular internal cavity size was normal in size. There is  no left ventricular hypertrophy. Left ventricular diastolic parameters were normal. Normal left ventricular filling pressure. Right Ventricle: The right ventricular size is normal. No increase in right ventricular wall thickness. Right ventricular systolic function is normal. There is normal pulmonary artery systolic pressure. The tricuspid regurgitant velocity  is 2.07 m/s, and  with an assumed right atrial pressure of 3 mmHg, the estimated right ventricular systolic pressure is 89.3 mmHg. Left Atrium: Left atrial size was normal in size. Right Atrium: Right atrial size was normal in size. Pericardium: There is no evidence of pericardial effusion. Mitral Valve: The mitral valve is  normal in structure. Trivial mitral valve regurgitation. No evidence of mitral valve stenosis. Tricuspid Valve: The tricuspid valve is normal in structure. Tricuspid valve regurgitation is trivial. No evidence of tricuspid stenosis. Aortic Valve: The aortic valve is normal in structure. Aortic valve regurgitation is not visualized. No aortic stenosis is present. Aortic valve mean gradient measures 3.0 mmHg. Aortic valve peak gradient measures 6.0 mmHg. Aortic valve area, by VTI measures 2.14 cm. Pulmonic Valve: The pulmonic valve was normal in structure. Pulmonic valve regurgitation is not visualized. No evidence of pulmonic stenosis. Aorta: The aortic root is normal in size and structure. Venous: The inferior vena cava is normal in size with greater than 50% respiratory variability, suggesting right atrial pressure of 3 mmHg. IAS/Shunts: No atrial level shunt detected by color flow Doppler.  LEFT VENTRICLE PLAX 2D LVIDd:         4.70 cm   Diastology LVIDs:         3.20 cm   LV e' medial:    8.70 cm/s LV PW:         0.50 cm   LV E/e' medial:  8.0 LV IVS:        0.50 cm   LV e' lateral:   10.20 cm/s LVOT diam:     1.90 cm   LV E/e' lateral: 6.8 LV SV:         51 LV SV Index:   28 LVOT Area:     2.84 cm  RIGHT VENTRICLE RV S prime:     11.90 cm/s TAPSE (M-mode): 2.3 cm LEFT ATRIUM             Index        RIGHT ATRIUM           Index LA diam:        2.70 cm 1.46 cm/m   RA Area:     12.80 cm LA Vol (A2C):   26.4 ml 14.31 ml/m  RA Volume:   31.30 ml  16.97 ml/m LA Vol (A4C):   41.8 ml 22.67 ml/m LA Biplane Vol: 36.4 ml 19.74 ml/m  AORTIC VALVE AV Area (Vmax):    2.23 cm AV Area (Vmean):   2.16 cm AV Area (VTI):     2.14 cm AV Vmax:           122.00 cm/s AV Vmean:          85.100 cm/s AV VTI:            0.240 m AV Peak Grad:      6.0 mmHg AV Mean Grad:      3.0 mmHg LVOT Vmax:         96.00 cm/s LVOT Vmean:        64.700 cm/s LVOT VTI:          0.181 m LVOT/AV VTI ratio: 0.75  AORTA Ao Root diam: 2.70 cm  MITRAL VALVE               TRICUSPID VALVE MV Area (PHT): 1.70 cm    TR Peak grad:   17.1 mmHg MV Decel Time: 446 msec    TR Vmax:  207.00 cm/s MV E velocity: 69.27 cm/s MV A velocity: 80.60 cm/s  SHUNTS MV E/A ratio:  0.86        Systemic VTI:  0.18 m                            Systemic Diam: 1.90 cm Fransico Him MD Electronically signed by Fransico Him MD Signature Date/Time: 09/17/2021/4:49:37 PM    Final        Hosie Poisson M.D. [facility] 09/17/2021, 5:49 PM  Available via Epic secure chat 7am-7pm After 7 pm, please refer to night coverage provider listed on amion.

## 2021-09-17 NOTE — Consult Note (Signed)
? ?  [facility] CM Inpatient Consult ? ? ?09/17/2021 ? ?Natale Lay ?1976/01/12 ?[id number] ? ?Managed Medicaid:  [employer]y [employer] ? ?Primary Care Provider: Delories Heinz, MD with [facility] per patient  [patient states this is a new PCP is she may need to change providers] ? ?Met with patient at the bedside for post hospital follow up needs.  Patient states she needs to find a PCP.  She states she has good follow up with her appointments and denies needs for transportation.  ? ?Patient verbalized needing a "new facial mask for her asthma treatment machine".  This Probation officer did notify the inpatient CM team member of request. ? ?Plan: Given an appointment reminder card and a 24 hour nurse advise line magnet.  She agrees to post hospital follow up with Managed Medicaid RNCM for asthma and pneumonia follow up.Also, for post hospital follow up needs.  She currently denies any SDOH issues. ? ?Natividad Brood, RN BSN CCM ?[facility] Liaison ? [phone] business mobile phone ?Toll free office [phone]  ?Fax number: [phone] ?[email or web address] ? ?

## 2021-09-18 ENCOUNTER — Telehealth: Payer: Self-pay | Admitting: Pulmonary Disease

## 2021-09-18 MED ORDER — PREDNISONE 20 MG PO TABS
ORAL_TABLET | ORAL | 0 refills | Status: DC
Start: 2021-09-18 — End: 2022-04-09

## 2021-09-18 MED ORDER — IPRATROPIUM-ALBUTEROL 0.5-2.5 (3) MG/3ML IN SOLN
3.0000 mL | Freq: Four times a day (QID) | RESPIRATORY_TRACT | 3 refills | Status: AC
Start: 2021-09-18 — End: ?

## 2021-09-18 MED ORDER — GUAIFENESIN-DM 100-10 MG/5ML PO SYRP: 15.0000 mL | ORAL_SOLUTION | ORAL | 0 refills | Status: AC | PRN

## 2021-09-18 MED ORDER — ENSURE ENLIVE PO LIQD: 237.0000 mL | Freq: Two times a day (BID) | ORAL | 12 refills | Status: AC

## 2021-09-18 MED ORDER — BUTALBITAL-APAP-CAFFEINE 50-325-40 MG PO TABS: 1.0000 | ORAL_TABLET | Freq: Four times a day (QID) | ORAL | 0 refills | Status: AC | PRN

## 2021-09-18 MED ORDER — ADULT MULTIVITAMIN W/MINERALS CH: 1.0000 | ORAL_TABLET | Freq: Every day | ORAL | Status: AC

## 2021-09-18 MED ORDER — AMOXICILLIN-POT CLAVULANATE 875-125 MG PO TABS: 1.0000 | ORAL_TABLET | Freq: Two times a day (BID) | ORAL | 0 refills | Status: AC

## 2021-09-18 NOTE — Discharge Summary (Incomplete)
?Physician Discharge Summary ?  ?Patient: Megan Chandler MRN: [id number] DOB: 14-Dec-1975  ?Admit date:     09/14/2021  ?Discharge date: 09/18/21  ?Discharge Physician: Hosie Poisson  ? ?PCP: Leighton Ruff, MD  ? ?Recommendations at discharge:  ? ? ?Discharge Diagnoses: ?Principal Problem: ?  Asthma exacerbation ?Active Problems: ?  Pulmonary embolus ([place]) ?  Anxiety ?  Marijuana dependence ([place]) ? ? ?Hospital Course: ?Megan Chandler is a 46 y.o. female with medical history significant of PE on Eliquis and asthma presenting with respiratory distress (sent from [facility]). ? ?Assessment and Plan: ? ?Asthma exacerbation ?Improving.  ?Wheezing has improved. Transition to oral steroids , monitor on oral steroids overnight and possible d/c in am. ?Pt reports that she was recently treated for pneumonia twice int he last tow months, her CXR does not show pneumonia. But she has rhonchi and reports sob and cough.  ?CT chest with contrast does not show any pneumonia, will transition to oral antibiotics.  ?  ?Marijuana dependence ([place]) ?-Cessation encouraged; this should be encouraged on an ongoing basis ?-UDS ordered ?  ?Anxiety ?Much improved.  ?  ?Pulmonary embolus ([place]) ?-Remote initial clot but chronic thrombus was appreciated on imaging in 11/2020 ?-Continue Eliquis ?  ?Headache:  ?Fiorcet ordered. As she is allergic to vicodin, morphine . Improved but not resolved.  ?  ?  ?Chest pain: atypical, worsened with cough.  ?EKG shows t wave inversions in the lateral leads similar in appearance to EKG in June 2022.  ?Echocardiogram ordered and pending.  ?Troponin is negative.  ?  ? ? {Tip this will not be part of the note when signed Body mass index is 26.47 kg/m?. ?,  ?Nutrition Documentation   ? ?Flowsheet Row ED to Hosp-Admission (Current) from 09/14/2021 in [place] HF PCU  ?Nutrition Problem Inadequate oral intake  ?Etiology decreased appetite  ?Nutrition Goal Patient will meet greater than or equal to 90% of  their needs  ?Interventions Refer to RD note for recommendations  ? ?  ?,  (Optional):26781} ? ?{(NOTE) Pain control PDMP Statment (Optional):26782} ?Consultants: NONE.  ?Procedures performed: ECHO  ?Disposition: Home ?Diet recommendation:  ?Discharge Diet Orders (From admission, onward)  ? ?  Start     Ordered  ? 09/18/21 0000  Diet - low sodium heart healthy       ? 09/18/21 0945  ? ?  ?  ? ?  ? ?Regular diet ?DISCHARGE MEDICATION: ?Allergies as of 09/18/2021   ? ?   Reactions  ? Morphine And Related Anaphylaxis  ? Pt can take dilaudid  ? Zyrtec [cetirizine] Other (See Comments)  ? Makes allergy - sneezing watery eyes - worse  ? Aspirin Itching  ? Pt can tolerate ibuprofen  ? Tramadol Itching  ? Vicodin [hydrocodone-acetaminophen] Itching  ? Pt can take percocet  ? ?  ? ?  ?Medication List  ?  ? ?STOP taking these medications   ? ?guaiFENesin 600 MG 12 hr tablet ?Commonly known as: [place] ?  ? ?  ? ?TAKE these medications   ? ?albuterol 108 (90 Base) MCG/ACT inhaler ?Commonly known as: VENTOLIN HFA ?Inhale 2 puffs into the lungs every 6 (six) hours as needed for wheezing or shortness of breath. ?  ?albuterol (2.5 MG/3ML) 0.083% nebulizer solution ?Commonly known as: PROVENTIL ?Take 3 mLs (2.5 mg total) by nebulization every 2 (two) hours as needed for wheezing or shortness of breath. ?  ?amoxicillin-clavulanate 875-125 MG tablet ?Commonly known as: AUGMENTIN ?  Take 1 tablet by mouth every 12 (twelve) hours for 2 days. ?  ?budesonide-formoterol 160-4.5 MCG/ACT inhaler ?Commonly known as: SYMBICORT ?Inhale 2 puffs into the lungs in the morning and at bedtime. ?  ?CLEAR EYES COMPLETE OP ?Place 1 drop into both eyes daily as needed (dry eyes). ?  ?diazepam 5 MG tablet ?Commonly known as: VALIUM ?Take 5 mg by mouth at bedtime. ?  ?Eliquis 5 MG Tabs tablet ?Generic drug: apixaban ?TAKE 1 TABLET BY MOUTH 2 TIMES DAILY ?What changed: how much to take ?  ?feeding supplement Liqd ?Take 237 mLs by mouth 2 (two) times daily  between meals. ?  ?guaiFENesin-dextromethorphan 100-10 MG/5ML syrup ?Commonly known as: ROBITUSSIN DM ?Take 15 mLs by mouth every 4 (four) hours as needed for cough. ?  ?ipratropium-albuterol 0.5-2.5 (3) MG/3ML Soln ?Commonly known as: DUONEB ?Take 3 mLs by nebulization every 6 (six) hours. ?  ?multivitamin with minerals Tabs tablet ?Take 1 tablet by mouth daily. ?  ?predniSONE 20 MG tablet ?Commonly known as: DELTASONE ?Prednisone 40 mg daily for 5 days. ?  ? ?  ? ?  ?  ? ? ?  ?Durable Medical Equipment  ?(From admission, onward)  ?  ? ? ?  ? ?  Start     Ordered  ? 09/18/21 0946  For home use only DME Nebulizer machine  Once       ?Question Answer Comment  ?Patient needs a nebulizer to treat with the following condition Asthma   ?Length of Need Lifetime   ?  ? 09/18/21 0945  ? 09/17/21 1119  For home use only DME Other see comment  Once       ?Comments: Mask for home nebulizer machine  ?Question:  Length of Need  Answer:  Lifetime  ? 09/17/21 1118  ? ?  ?  ? ?  ? ? Follow-up Information   ? ? Leighton Ruff, MD Follow up.   ?Specialty: Cardiology ?Contact information: ?[address] ?[place] [address] ?[phone] ? ? ?  ?  ? ?  ?  ? ?  ? ?Discharge Exam: ?Filed Weights  ? 09/16/21 0512 09/17/21 0435 09/18/21 0550  ?Weight: 74.6 kg 75 kg 74.4 kg  ? ?General exam: Appears calm and comfortable  ?Respiratory system: SCATTERED WHEEZING POSTERIORLY ON RA.  ?Cardiovascular system: S1 & S2 heard, RRR. No JVD, murmurs, rubs, gallops or clicks. No pedal edema. ?Gastrointestinal system: Abdomen is nondistended, soft and nontender. No organomegaly or masses felt. Normal bowel sounds heard. ?Central nervous system: Alert and oriented. No focal neurological deficits. ?Extremities: Symmetric 5 x 5 power. ?Skin: No rashes, lesions or ulcers ?Psychiatry: Judgement and insight appear normal. Mood & affect appropriate.  ? ? ?Condition at discharge: fair ? ?The results of significant diagnostics from this hospitalization  (including imaging, microbiology, ancillary and laboratory) are listed below for reference.  ? ?Imaging Studies: ?CT CHEST W CONTRAST ? ?Result Date: 09/16/2021 ?CLINICAL DATA:  Recurrent pneumonia EXAM: CT CHEST WITH CONTRAST TECHNIQUE: Multidetector CT imaging of the chest was performed during intravenous contrast administration. RADIATION DOSE REDUCTION: This exam was performed according to the departmental dose-optimization program which includes automated exposure control, adjustment of the mA and/or kV according to patient size and/or use of iterative reconstruction technique. CONTRAST:  169m OMNIPAQUE IOHEXOL 300 MG/ML  SOLN COMPARISON:  10/16/2020 FINDINGS: Cardiovascular: No significant vascular findings. Normal heart size. No pericardial effusion. Mediastinum/Nodes: No enlarged mediastinal, hilar, or axillary lymph nodes. Thyroid gland, trachea, and esophagus demonstrate no  significant findings. Lungs/Pleura: Minimal paraseptal emphysema. Bandlike scarring of the left lung base. No acute appearing airspace opacity. No pleural effusion or pneumothorax. Upper Abdomen: No acute abnormality. Hepatic steatosis. Status post cholecystectomy. Musculoskeletal: No chest wall abnormality. No suspicious osseous lesions identified. IMPRESSION: 1. No acute appearing airspace opacity. Bandlike scarring of the left lung base. 2. Minimal paraseptal emphysema. 3. Hepatic steatosis. Emphysema (ICD10-J43.9). Electronically Signed   By: Delanna Ahmadi M.D.   On: 09/16/2021 20:53  ? ?DG Chest Port 1 View ? ?Result Date: 09/14/2021 ?CLINICAL DATA:  Cough and shortness of breath. Respiratory distress. EXAM: PORTABLE CHEST 1 VIEW COMPARISON:  07/22/2021 FINDINGS: Markedly reverse lordotic projection. Low lung volumes are present, causing crowding of the pulmonary vasculature. Heart size within normal limits. No discrete airspace opacity or blunting of the costophrenic angles. Lower cervical plate and screw fixator. IMPRESSION: 1. No  acute thoracic findings. 2. Low lung volumes. 3. Substantially reverse lordotic projection. Electronically Signed   By: Van Clines M.D.   On: 09/14/2021 12:05  ? ?ECHOCARDIOGRAM COMPLETE ? ?Result Date: 3

## 2021-09-18 NOTE — Telephone Encounter (Signed)
Hey MH I did not see anything in your notes about a stress test for this patient. Do you want her to have a stress test or refer her back to her cardiologist.  ? ?Mh please advise  ?

## 2021-09-18 NOTE — Telephone Encounter (Signed)
Assuming she means cardiac stress test, I do not order cardiac stress tests. I recommend she contact her cardiologist at [facility] for further discussion. Or can refer to cardiologist here locally if she prefers.

## 2021-09-19 LAB — CULTURE, BLOOD (ROUTINE X 2)
Culture: NO GROWTH
Culture: NO GROWTH

## 2021-09-19 NOTE — Discharge Summary (Signed)
?Physician Discharge Summary ?  ?Patient: Megan Chandler MRN: [id number] DOB: 03/07/1976  ?Admit date:     09/14/2021  ?Discharge date: 09/18/2021  ?Discharge Physician: Hosie Poisson  ? ?PCP: Leighton Ruff, MD  ? ?Recommendations at discharge:  ?Please follow up with cardiology in one week for a stress test.  ?Please follow upw ith PCP in one week  ?Please follow up with pulmonology Dr Silas Flood in one week.  ? ?Discharge Diagnoses: ?Principal Problem: ?  Asthma exacerbation ?Active Problems: ?  Pulmonary embolus ([place]) ?  Anxiety ?  Marijuana dependence ([place]) ? ? ?Hospital Course: ? Megan Chandler is a 46 y.o. female with medical history significant of PE on Eliquis and asthma presenting with respiratory distress (sent from UC). ? ?Assessment and Plan: ? ?Asthma exacerbation ?Improving.  ?Wheezing has improved. Transition  IV solumedrol to oral steroids  on discharge  ?Pt reports that she was recently treated for pneumonia twice int he last tow months, her CXR does not show pneumonia. But she has rhonchi and reports sob and cough.  ?CT chest with contrast does not show any pneumonia, will transition to oral antibiotics on discharged.  ?  ?Marijuana dependence ([place]) ?-Cessation encouraged; this should be encouraged on an ongoing basis ? ?  ?Anxiety ?Much improved.  ?  ?Pulmonary embolus ([place]) ?-Remote initial clot but chronic thrombus was appreciated on imaging in 11/2020 ?-Continue Eliquis ?  ?Headache:  ?Fiorcet ordered. As she is allergic to vicodin, morphine . Improved but not resolved.  ?Pt requesting fiorcet for a few days for recurrent headaches till she see's her PCP  ?  ?  ?Chest pain: atypical, worsened with cough.  ?EKG shows t wave inversions in the lateral leads similar in appearance to EKG in June 2022.  ?Echocardiogram reviewed, no regional wall abn.  ?Troponin is negative.  ?Recommended outpatient follow up with cardiology for stress test in view of lateral lead changes on EKG.  ?   ?Interventions: Refer to RD note for recommendations ?  ?Estimated body mass index is 26.68 kg/m? as calculated from the following: ?  Height as of 01/29/21: '5\' 6"'$  (1.676 m). ?  Weight as of this encounter: 75 kg. ? ?  ? ? ?Consultants: NONE.  ?Procedures performed: ECHO  ?Disposition: Home ?Diet recommendation:  ?Discharge Diet Orders (From admission, onward)  ? ?  Start     Ordered  ? 09/18/21 0000  Diet - low sodium heart healthy       ? 09/18/21 0945  ? ?  ?  ? ?  ? ?Regular diet ?DISCHARGE MEDICATION: ?Allergies as of 09/18/2021   ? ?   Reactions  ? Morphine And Related Anaphylaxis  ? Pt can take dilaudid  ? Zyrtec [cetirizine] Other (See Comments)  ? Makes allergy - sneezing watery eyes - worse  ? Aspirin Itching  ? Pt can tolerate ibuprofen  ? Tramadol Itching  ? Vicodin [hydrocodone-acetaminophen] Itching  ? Pt can take percocet  ? ?  ? ?  ?Medication List  ?  ? ?STOP taking these medications   ? ?guaiFENesin 600 MG 12 hr tablet ?Commonly known as: [place] ?  ? ?  ? ?TAKE these medications   ? ?albuterol 108 (90 Base) MCG/ACT inhaler ?Commonly known as: VENTOLIN HFA ?Inhale 2 puffs into the lungs every 6 (six) hours as needed for wheezing or shortness of breath. ?  ?albuterol (2.5 MG/3ML) 0.083% nebulizer solution ?Commonly known as: PROVENTIL ?Take 3 mLs (2.5 mg total) by  nebulization every 2 (two) hours as needed for wheezing or shortness of breath. ?  ?amoxicillin-clavulanate 875-125 MG tablet ?Commonly known as: AUGMENTIN ?Take 1 tablet by mouth every 12 (twelve) hours for 2 days. ?  ?budesonide-formoterol 160-4.5 MCG/ACT inhaler ?Commonly known as: SYMBICORT ?Inhale 2 puffs into the lungs in the morning and at bedtime. ?  ?butalbital-acetaminophen-caffeine 50-325-40 MG tablet ?Commonly known as: FIORICET ?Take 1 tablet by mouth every 6 (six) hours as needed for up to 3 days for headache. ?  ?CLEAR EYES COMPLETE OP ?Place 1 drop into both eyes daily as needed (dry eyes). ?  ?diazepam 5 MG tablet ?Commonly  known as: VALIUM ?Take 5 mg by mouth at bedtime. ?  ?Eliquis 5 MG Tabs tablet ?Generic drug: apixaban ?TAKE 1 TABLET BY MOUTH 2 TIMES DAILY ?What changed: how much to take ?  ?feeding supplement Liqd ?Take 237 mLs by mouth 2 (two) times daily between meals. ?  ?guaiFENesin-dextromethorphan 100-10 MG/5ML syrup ?Commonly known as: ROBITUSSIN DM ?Take 15 mLs by mouth every 4 (four) hours as needed for cough. ?  ?ipratropium-albuterol 0.5-2.5 (3) MG/3ML Soln ?Commonly known as: DUONEB ?Take 3 mLs by nebulization every 6 (six) hours. ?  ?multivitamin with minerals Tabs tablet ?Take 1 tablet by mouth daily. ?  ?predniSONE 20 MG tablet ?Commonly known as: DELTASONE ?Prednisone 40 mg daily for 5 days. ?  ? ?  ? ? Follow-up Information   ? ? Leighton Ruff, MD Follow up.   ?Specialty: Cardiology ?Why: for stress test. ?Contact information: ?[address] ?[place] [address] ?[id number] ? ? ?  ?  ? ?  ?  ? ?  ? ?Discharge Exam: ?Filed Weights  ? 09/16/21 0512 09/17/21 0435 09/18/21 0550  ?Weight: 74.6 kg 75 kg 74.4 kg  ? ?General exam: Appears calm and comfortable  ?Respiratory system: Clear to auscultation. Respiratory effort normal. ?Cardiovascular system: S1 & S2 heard, RRR. No JVD, murmurs, rubs, gallops or clicks. No pedal edema. ?Gastrointestinal system: Abdomen is nondistended, soft and nontender. No organomegaly or masses felt. Normal bowel sounds heard. ?Central nervous system: Alert and oriented. No focal neurological deficits. ?Extremities: Symmetric 5 x 5 power. ?Skin: No rashes, lesions or ulcers ?Psychiatry: Judgement and insight appear normal. Mood & affect appropriate.  ? ? ?Condition at discharge: good ? ?The results of significant diagnostics from this hospitalization (including imaging, microbiology, ancillary and laboratory) are listed below for reference.  ? ?Imaging Studies: ?CT CHEST W CONTRAST ? ?Result Date: 09/16/2021 ?CLINICAL DATA:  Recurrent pneumonia EXAM: CT CHEST WITH CONTRAST TECHNIQUE:  Multidetector CT imaging of the chest was performed during intravenous contrast administration. RADIATION DOSE REDUCTION: This exam was performed according to the departmental dose-optimization program which includes automated exposure control, adjustment of the mA and/or kV according to patient size and/or use of iterative reconstruction technique. CONTRAST:  144m OMNIPAQUE IOHEXOL 300 MG/ML  SOLN COMPARISON:  10/16/2020 FINDINGS: Cardiovascular: No significant vascular findings. Normal heart size. No pericardial effusion. Mediastinum/Nodes: No enlarged mediastinal, hilar, or axillary lymph nodes. Thyroid gland, trachea, and esophagus demonstrate no significant findings. Lungs/Pleura: Minimal paraseptal emphysema. Bandlike scarring of the left lung base. No acute appearing airspace opacity. No pleural effusion or pneumothorax. Upper Abdomen: No acute abnormality. Hepatic steatosis. Status post cholecystectomy. Musculoskeletal: No chest wall abnormality. No suspicious osseous lesions identified. IMPRESSION: 1. No acute appearing airspace opacity. Bandlike scarring of the left lung base. 2. Minimal paraseptal emphysema. 3. Hepatic steatosis. Emphysema (ICD10-J43.9). Electronically Signed   By: AJamse MeadD.  On: 09/16/2021 20:53  ? ?DG Chest Port 1 View ? ?Result Date: 09/14/2021 ?CLINICAL DATA:  Cough and shortness of breath. Respiratory distress. EXAM: PORTABLE CHEST 1 VIEW COMPARISON:  07/22/2021 FINDINGS: Markedly reverse lordotic projection. Low lung volumes are present, causing crowding of the pulmonary vasculature. Heart size within normal limits. No discrete airspace opacity or blunting of the costophrenic angles. Lower cervical plate and screw fixator. IMPRESSION: 1. No acute thoracic findings. 2. Low lung volumes. 3. Substantially reverse lordotic projection. Electronically Signed   By: Van Clines M.D.   On: 09/14/2021 12:05  ? ?ECHOCARDIOGRAM COMPLETE ? ?Result Date: 09/17/2021 ?    ECHOCARDIOGRAM REPORT   Patient Name:   DELORIA BRASSFIELD Date of Exam: 09/17/2021 Medical Rec #:  [id number]         Height:       66.0 in Accession #:    [id number]        Weight:       165.3 lb Date of Birth:  [birth year]

## 2021-09-19 NOTE — Telephone Encounter (Signed)
Patient is admitted at this moment. I told her to call our office when she is discharged and what [place] advises. Nothing further  ?

## 2021-09-25 ENCOUNTER — Other Ambulatory Visit: Payer: Self-pay

## 2021-09-25 NOTE — Patient Instructions (Signed)
Natale Lay ,  ? ?The [facility] Managed Care Team is available to provide assistance to you with your healthcare needs at no cost and as a benefit of your [facility] Health plan.  ? ?I'm sorry I was unable to reach you today for our scheduled appointment. Our care guide will call you to reschedule our telephone appointment. Please call me at the number below. I am available to be of assistance to you regarding your healthcare needs. .  ? ?Thank you,  ? ?Salvatore Marvel RN, BSN ?Community Care Coordinator ?[facility] ?Mobile: [phone]   ?

## 2021-09-25 NOTE — Patient Outreach (Signed)
Care Coordination ? ?09/25/2021 ? ?Megan Chandler ?06-12-1976 ?[id number] ? ?09/25/2021 ?Name: Megan Chandler MRN: [id number] DOB: 1975-11-18 ? ?Referred by: Leighton Ruff, MD ?Reason for referral : High Risk Managed Medicaid (Unsuccessful Telephone Outreach for Initial Visit) ? ? ?An unsuccessful telephone outreach was attempted today. The patient was referred to the case management team for assistance with care management and care coordination.  Patient's telephone did not allow for a voicemail message to be left. ? ?Follow Up Plan: The Managed Medicaid care management team will reach out to the patient again over the next 7 - 14 days.  ? ? ?Salvatore Marvel RN, BSN ?Community Care Coordinator ?[facility] ?Mobile: [phone]  ? ? ?

## 2021-10-02 ENCOUNTER — Other Ambulatory Visit: Payer: Self-pay

## 2021-10-02 NOTE — Patient Instructions (Signed)
Natale Lay ,  ? ?The [facility] Managed Care Team is available to provide assistance to you with your healthcare needs at no cost and as a benefit of your [facility] Health plan.  ? ?This Consulting civil engineer had a successful telephone outreach with the patient for the Initial Visit this morning.  Patient requested we reschedule the visit to later today at 4:00 PM since patient was traveling to a doctor's appointment at the time of this telephone call.  I agreed with this request and will contact patient later today for an Initial Visit. ?  ?Plan: This RN Care Manager will make a telephone outreach at 4:00 PM today to perform an Initial Visit with the patient..  ? ?Thank you,  ? ?Salvatore Marvel RN, BSN ?Community Care Coordinator ?[facility] ?Mobile: [phone]  ?

## 2021-10-02 NOTE — Patient Instructions (Signed)
Natale Lay ,  ? ?The [facility] Managed Care Team is available to provide assistance to you with your healthcare needs at no cost and as a benefit of your [facility] Health plan.   ? ?This Consulting civil engineer had a successful telephone outreach with the patient this afternoon. The purpose of the contact was for an Initial Visit with the [place] Medicaid program.  Patient received information about the [place] Medicaid program and declined enrollment into the program at this time.   ?  ?This RN Care Manager reassured patient that [place] Medicaid team is available if she decides to enroll at a later date..  ? ?Thank you,  ?Salvatore Marvel RN, BSN ?Community Care Coordinator ?[facility] ?Mobile: [phone]  ?

## 2021-10-02 NOTE — Patient Outreach (Signed)
Care Coordination ? ?10/02/2021 ? ?Natale Lay ?21-Nov-1975 ?[id number] ? ?This RN Care Manager had a successful telephone outreach with the patient for the Initial Visit this morning.  Patient requested we reschedule the visit to later today at 4:00 PM since patient was traveling to a doctor's appointment at the time of this telephone call.  I agreed with this request and will contact patient later today for an Initial Visit. ? ?Plan: This RN Care Manager will make a telephone outreach at 4:00 PM today to perform an Initial Visit with the patient. ? ?Salvatore Marvel RN, BSN ?Community Care Coordinator ?[facility] ?Mobile: [phone]  ?

## 2021-10-02 NOTE — Patient Outreach (Signed)
Care Coordination ? ?10/02/2021 ? ?Natale Lay ?1976-04-07 ?[id number] ? ?This RN Care Manager had a successful telephone outreach with the patient this afternoon. The purpose of the contact was for an Initial Visit with the [place] Medicaid program.  Patient received information about the [place] Medicaid program and declined enrollment into the program at this time.   ? ?This RN Care Manager reassured patient that [place] Medicaid team is available if she decides to enroll at a later date. ? ?Salvatore Marvel RN, BSN ?Community Care Coordinator ?[facility] ?Mobile: [phone]  ?

## 2021-10-09 ENCOUNTER — Ambulatory Visit: Payer: Medicaid Other | Admitting: Pulmonary Disease

## 2021-10-09 ENCOUNTER — Telehealth: Payer: Self-pay | Admitting: Pulmonary Disease

## 2021-10-09 NOTE — Telephone Encounter (Signed)
Unable to contact patient due to voicemail box not being set up. Will try again  ?

## 2021-10-09 NOTE — Telephone Encounter (Signed)
Called and spoke with patient. She is supposed to be having eye surgery soon. Patient says the appointment isn't scheduled yet. Patient wants to know when she should stop taking eliquis.  ? ?Dr. Silas Flood, please advise. ?

## 2021-10-11 NOTE — Telephone Encounter (Signed)
Called and spoke with patient, advised of recommendations per Dr. Silas Flood regarding the blood thinner.  She verbalized understanding.  She states she has to get a Chiropodist and will see them on May 15th.  She asked about her upcoming appointment with Dr. Silas Flood, I let her know it was on Thursday April 20th at 4:15 pm and she would need to arrive by 4 pm for check in.  Nothing further needed. ?

## 2021-10-17 NOTE — Telephone Encounter (Signed)
Will close encounter

## 2021-10-25 ENCOUNTER — Ambulatory Visit: Payer: Medicaid Other | Admitting: Pulmonary Disease

## 2021-12-21 ENCOUNTER — Ambulatory Visit: Payer: Medicaid Other | Admitting: Obstetrics

## 2021-12-21 ENCOUNTER — Other Ambulatory Visit: Payer: Self-pay | Admitting: Obstetrics

## 2021-12-21 DIAGNOSIS — B3731 Acute candidiasis of vulva and vagina: Secondary | ICD-10-CM

## 2021-12-21 DIAGNOSIS — B9689 Other specified bacterial agents as the cause of diseases classified elsewhere: Secondary | ICD-10-CM

## 2021-12-21 MED ORDER — METRONIDAZOLE 500 MG PO TABS: 500.0000 mg | ORAL_TABLET | Freq: Two times a day (BID) | ORAL | 2 refills | Status: DC

## 2021-12-21 MED ORDER — FLUCONAZOLE 150 MG PO TABS: 150.0000 mg | ORAL_TABLET | Freq: Once | ORAL | 0 refills | Status: AC

## 2021-12-25 ENCOUNTER — Ambulatory Visit: Payer: Medicaid Other | Admitting: Obstetrics

## 2022-02-13 ENCOUNTER — Other Ambulatory Visit: Payer: Self-pay | Admitting: *Deleted

## 2022-02-13 MED ORDER — FLUCONAZOLE 150 MG PO TABS: 150.0000 mg | ORAL_TABLET | Freq: Once | ORAL | 0 refills | Status: AC

## 2022-02-13 NOTE — Progress Notes (Signed)
Diflucan ordered for yeast inf symptoms per protocol.

## 2022-03-06 ENCOUNTER — Ambulatory Visit: Payer: Medicaid Other

## 2022-04-08 ENCOUNTER — Encounter (HOSPITAL_BASED_OUTPATIENT_CLINIC_OR_DEPARTMENT_OTHER): Payer: Self-pay

## 2022-04-08 DIAGNOSIS — Z7901 Long term (current) use of anticoagulants: Secondary | ICD-10-CM | POA: Diagnosis not present

## 2022-04-08 DIAGNOSIS — M79605 Pain in left leg: Secondary | ICD-10-CM | POA: Insufficient documentation

## 2022-04-08 DIAGNOSIS — M5432 Sciatica, left side: Secondary | ICD-10-CM | POA: Insufficient documentation

## 2022-04-08 NOTE — ED Triage Notes (Addendum)
Pt reports pain that began this am  Left lower back to thigh Does have hx of sciatica, pt reports pain is different.   +spasms in triage Has been taking zanaflex with no relief

## 2022-04-09 ENCOUNTER — Emergency Department (HOSPITAL_BASED_OUTPATIENT_CLINIC_OR_DEPARTMENT_OTHER)
Admission: EM | Admit: 2022-04-09 | Discharge: 2022-04-09 | Disposition: A | Discharge status: Home / Self Care | Payer: Medicaid Other | Attending: Emergency Medicine | Admitting: Emergency Medicine

## 2022-04-09 DIAGNOSIS — M5432 Sciatica, left side: Secondary | ICD-10-CM

## 2022-04-09 MED ORDER — METHOCARBAMOL 500 MG PO TABS: 500.0000 mg | ORAL_TABLET | Freq: Two times a day (BID) | ORAL | 0 refills | Status: AC

## 2022-04-09 MED ORDER — KETOROLAC TROMETHAMINE 30 MG/ML IJ SOLN
30.0000 mg | Freq: Once | INTRAMUSCULAR | Status: AC
  Administered 2022-04-09: 30 mg via INTRAMUSCULAR
  Filled 2022-04-09: qty 1

## 2022-04-09 MED ORDER — PREDNISONE 50 MG PO TABS
60.0000 mg | ORAL_TABLET | Freq: Once | ORAL | Status: AC
  Administered 2022-04-09: 60 mg via ORAL
  Filled 2022-04-09: qty 1

## 2022-04-09 MED ORDER — PREDNISONE 10 MG (21) PO TBPK: ORAL_TABLET | ORAL | 0 refills | Status: AC

## 2022-04-09 NOTE — ED Notes (Signed)
ED Provider at bedside. 

## 2022-04-09 NOTE — ED Notes (Signed)
Pt sitting up in bed awake and alert; no acute distress.  Reports ongoing lower back pain radiating to L hip and down to L thigh - LLE distal neurovascular status intact; denies loss of bowel/bladder.

## 2022-04-09 NOTE — ED Notes (Signed)
Pt now oob ambulatory independently with steady gait to hall bathroom

## 2022-04-09 NOTE — ED Provider Notes (Signed)
[facility] EMERGENCY DEPT  Provider Note  CSN: [id number] Arrival date & time: 04/08/22 2350  History Chief Complaint  Patient presents with   Leg Pain    Megan Chandler is a 46 y.o. female with multiple prior neck and back surgeries reports she woke up yesterday with severe pain in L lower back, radiating into her L leg. Has not had issues in several years. No new falls or injuries. No numbness or weakness in legs. No incontinence. Took some Zanaflex without much improvement.    Home Medications Prior to Admission medications   Medication Sig Start Date End Date Taking? Authorizing Provider  methocarbamol (ROBAXIN) 500 MG tablet Take 1 tablet (500 mg total) by mouth 2 (two) times daily. 04/09/22  Yes Truddie Hidden, MD  predniSONE (STERAPRED UNI-PAK 21 TAB) 10 MG (21) TBPK tablet '10mg'$  Tabs, 6 day taper. Use as directed 04/09/22  Yes Truddie Hidden, MD  albuterol (PROVENTIL) (2.5 MG/3ML) 0.083% nebulizer solution Take 3 mLs (2.5 mg total) by nebulization every 2 (two) hours as needed for wheezing or shortness of breath. 11/06/20   Hunsucker, Bonna Gains, MD  albuterol (VENTOLIN HFA) 108 (90 Base) MCG/ACT inhaler Inhale 2 puffs into the lungs every 6 (six) hours as needed for wheezing or shortness of breath. 11/06/20   Hunsucker, Bonna Gains, MD  budesonide-formoterol (SYMBICORT) 160-4.5 MCG/ACT inhaler Inhale 2 puffs into the lungs in the morning and at bedtime. 11/06/20   Hunsucker, Bonna Gains, MD  diazepam (VALIUM) 5 MG tablet Take 5 mg by mouth at bedtime. 10/18/20   [provider]  ELIQUIS 5 MG TABS tablet TAKE 1 TABLET BY MOUTH 2 TIMES DAILY Patient taking differently: Take 5 mg by mouth 2 (two) times daily. 02/20/21   Hunsucker, Bonna Gains, MD  feeding supplement (ENSURE ENLIVE / ENSURE PLUS) LIQD Take 237 mLs by mouth 2 (two) times daily between meals. 09/18/21   Hosie Poisson, MD  guaiFENesin-dextromethorphan (ROBITUSSIN DM) 100-10 MG/5ML syrup Take 15 mLs by  mouth every 4 (four) hours as needed for cough. 09/18/21   Hosie Poisson, MD  Hyprom-Naphaz-Polysorb-Zn Sulf (CLEAR EYES COMPLETE OP) Place 1 drop into both eyes daily as needed (dry eyes).    [provider]  ipratropium-albuterol (DUONEB) 0.5-2.5 (3) MG/3ML SOLN Take 3 mLs by nebulization every 6 (six) hours. 09/18/21   Hosie Poisson, MD  metroNIDAZOLE (FLAGYL) 500 MG tablet Take 1 tablet (500 mg total) by mouth 2 (two) times daily. 12/21/21   Shelly Bombard, MD  Multiple Vitamin (MULTIVITAMIN WITH MINERALS) TABS tablet Take 1 tablet by mouth daily. 09/18/21   Hosie Poisson, MD     Allergies    Morphine and related, Zyrtec [cetirizine], Aspirin, Tramadol, and Vicodin [hydrocodone-acetaminophen]   Review of Systems   Review of Systems Please see HPI for pertinent positives and negatives  Physical Exam BP (!) 125/93   Pulse 81   Temp (!) 97.5 F (36.4 C) (Oral)   Resp 18   Ht '5\' 6"'$  (1.676 m)   Wt 77.1 kg   LMP 05/01/2012   SpO2 100%   BMI 27.44 kg/m   Physical Exam Vitals and nursing note reviewed.  HENT:     Head: Normocephalic.     Nose: Nose normal.  Eyes:     Extraocular Movements: Extraocular movements intact.  Pulmonary:     Effort: Pulmonary effort is normal.  Musculoskeletal:        General: Tenderness (diffuse lumbar paraspinal muscles and L sciatic notch)  present. Normal range of motion.     Cervical back: Neck supple.  Skin:    Findings: No rash (on exposed skin).  Neurological:     Mental Status: She is alert and oriented to person, place, and time.     Cranial Nerves: No cranial nerve deficit.     Motor: No weakness.     Gait: Gait abnormal (antalgic).     Deep Tendon Reflexes: Reflexes normal.  Psychiatric:        Mood and Affect: Mood normal.     ED Results / Procedures / Treatments   EKG None  Procedures Procedures  Medications Ordered in the ED Medications  ketorolac (TORADOL) 30 MG/ML injection 30 mg (has no administration in  time range)  predniSONE (DELTASONE) tablet 60 mg (has no administration in time range)    Initial Impression and Plan  Patient with left sided sciatica pain, no red flags, will give Toradol, begin course of steroids. Switch zanaflex to robaxin which may work better for her. Follow up with her spine surgeon this week if not improved.   ED Course       MDM Rules/Calculators/A&P Medical Decision Making Problems Addressed: Sciatica of left side: acute illness or injury  Risk Prescription drug management.    Final Clinical Impression(s) / ED Diagnoses Final diagnoses:  Sciatica of left side    Rx / DC Orders ED Discharge Orders          Ordered    predniSONE (STERAPRED UNI-PAK 21 TAB) 10 MG (21) TBPK tablet        04/09/22 0416    methocarbamol (ROBAXIN) 500 MG tablet  2 times daily        04/09/22 0416             Truddie Hidden, MD 04/09/22 [phone]

## 2022-04-09 NOTE — ED Notes (Signed)
Pt agreeable with d/c plan as discussed by provider- this nurse has verbally reinforced d/c instructions and provided pt with written copy-pt acknowledges verbal understanding and denies any addl questions, concerns, needs - ambulatory independently with steady gait; vitals stable; no distress.

## 2022-04-09 NOTE — ED Notes (Signed)
Pt arrives from waiting room to exam room via w/c escorted by ED tech --guarding L hip.  Per tech pt able to xfer from w/c to stretcher independently.  Awaits provider eval at this time.

## 2022-05-03 NOTE — Telephone Encounter (Signed)
ERROR

## 2022-05-22 ENCOUNTER — Telehealth: Payer: Self-pay | Admitting: Emergency Medicine

## 2022-05-22 NOTE — Telephone Encounter (Signed)
RC to patient who left voicemail on nurse line. States that she is in public, and will send Mychart message.

## 2022-05-28 ENCOUNTER — Other Ambulatory Visit: Payer: Self-pay | Admitting: Emergency Medicine

## 2022-05-28 MED ORDER — FLUCONAZOLE 150 MG PO TABS: 150.0000 mg | ORAL_TABLET | Freq: Once | ORAL | 1 refills | Status: DC

## 2022-05-28 NOTE — Progress Notes (Signed)
Rx for yeast with 1 refill per Jodi Mourning, MD.

## 2022-07-03 ENCOUNTER — Other Ambulatory Visit: Payer: Self-pay | Admitting: Pulmonary Disease

## 2022-07-29 ENCOUNTER — Telehealth: Payer: Self-pay | Admitting: Pulmonary Disease

## 2022-07-29 NOTE — Telephone Encounter (Signed)
Agree with UC or ED evaluation

## 2022-07-29 NOTE — Telephone Encounter (Signed)
Tried calling the pt and there was no answer- VM not set up yet, will try back tomorrow

## 2022-07-29 NOTE — Telephone Encounter (Signed)
Spoke with pt who states of the last 3 days she has felt chest pain and fears it is a blood clot. Pt states she has been taking blood thinner as directed. Pt states slight increase of SOB and cough. Pt denies fever/ chills/ GI upset. Pt requested OV but currently none are available for tomorrow. Pt instructed to go for medical evaluation at [facility] or ED. Pt states she will and wants Dr. Silas Flood to know what is going on. Dr. Lupita Shutter please advise.

## 2022-07-29 NOTE — Telephone Encounter (Signed)
Pt calling in w/chest pains. Fears blood clot. No appts avail w/Dr. Lemmie Evens. She said Dr. Lemmie Evens told her she could call in for appt referral to Dr. Lane Hacker in [facility] Cardiologist . Offered appt w/ NP soon but she dcln. Pls call @ [phone]

## 2022-07-29 NOTE — Telephone Encounter (Signed)
Noted. Nothing further needed at this time.  

## 2022-11-11 ENCOUNTER — Other Ambulatory Visit: Payer: Self-pay | Admitting: Obstetrics

## 2022-11-11 DIAGNOSIS — B9689 Other specified bacterial agents as the cause of diseases classified elsewhere: Secondary | ICD-10-CM

## 2022-11-20 ENCOUNTER — Ambulatory Visit: Payer: Medicaid Other | Admitting: Obstetrics

## 2022-11-22 ENCOUNTER — Encounter: Payer: Self-pay | Admitting: Pulmonary Disease

## 2022-11-22 ENCOUNTER — Ambulatory Visit: Payer: Medicaid Other | Admitting: Pulmonary Disease

## 2022-11-26 ENCOUNTER — Ambulatory Visit: Payer: Medicaid Other | Admitting: Pulmonary Disease

## 2022-11-26 ENCOUNTER — Ambulatory Visit: Payer: Medicaid Other | Admitting: Obstetrics

## 2022-11-27 ENCOUNTER — Encounter: Payer: Self-pay | Admitting: *Deleted

## 2023-09-22 ENCOUNTER — Other Ambulatory Visit: Payer: Self-pay | Admitting: Pulmonary Disease

## 2023-09-22 DIAGNOSIS — J4530 Mild persistent asthma, uncomplicated: Secondary | ICD-10-CM

## 2023-09-23 ENCOUNTER — Encounter: Payer: Self-pay | Admitting: Obstetrics

## 2023-09-23 ENCOUNTER — Other Ambulatory Visit (HOSPITAL_COMMUNITY)
Admission: RE | Admit: 2023-09-23 | Discharge: 2023-09-23 | Disposition: A | Discharge status: Home / Self Care | Source: Ambulatory Visit | Attending: Obstetrics | Admitting: Obstetrics

## 2023-09-23 ENCOUNTER — Ambulatory Visit: Admitting: Obstetrics

## 2023-09-23 VITALS — BP 123/85 | HR 86 | Ht 66.0 in | Wt 165.0 lb

## 2023-09-23 DIAGNOSIS — N760 Acute vaginitis: Secondary | ICD-10-CM

## 2023-09-23 DIAGNOSIS — Z90710 Acquired absence of both cervix and uterus: Secondary | ICD-10-CM | POA: Diagnosis not present

## 2023-09-23 DIAGNOSIS — Z91199 Patient's noncompliance with other medical treatment and regimen due to unspecified reason: Secondary | ICD-10-CM

## 2023-09-23 DIAGNOSIS — N898 Other specified noninflammatory disorders of vagina: Secondary | ICD-10-CM | POA: Insufficient documentation

## 2023-09-23 DIAGNOSIS — B9689 Other specified bacterial agents as the cause of diseases classified elsewhere: Secondary | ICD-10-CM

## 2023-09-23 DIAGNOSIS — B3731 Acute candidiasis of vulva and vagina: Secondary | ICD-10-CM | POA: Diagnosis not present

## 2023-09-23 MED ORDER — FLUCONAZOLE 150 MG PO TABS: 150.0000 mg | ORAL_TABLET | Freq: Once | ORAL | 2 refills | Status: AC

## 2023-09-23 MED ORDER — METRONIDAZOLE 500 MG PO TABS: 500.0000 mg | ORAL_TABLET | Freq: Two times a day (BID) | ORAL | 2 refills | Status: DC

## 2023-09-23 NOTE — Progress Notes (Signed)
 Patient ID: Megan Chandler, female   DOB: 06-28-1976, 48 y.o.   MRN: [id number]  Chief Complaint  Patient presents with   Vaginal Discharge    HPI Megan Chandler is a 48 y.o. female.  Complains of vaginal discharge with itching  and odor. HPI  Past Medical History:  Diagnosis Date   Adenomyosis    Anemia    Anxiety    Asthma    daily inhaler use   Bartholin's cyst    hx of   Bronchitis    Endometriosis    GERD (gastroesophageal reflux disease)    Menometrorrhagia    Pneumonia 08/18/2018   Pulmonary embolism (HCC)    11/2011/ states no since  anticoagulation 1/14   Sciatica     Past Surgical History:  Procedure Laterality Date   ABDOMINAL HYSTERECTOMY     BACK SURGERY     07/2011   BILATERAL SALPINGECTOMY Bilateral 10/09/2012   Procedure: BILATERAL SALPINGECTOMY;  Surgeon: Antionette Char, MD;  Location: [facility];  Service: Gynecology;  Laterality: Bilateral;   CERVICAL SPINE SURGERY  2009   CESAREAN SECTION  1996   CHOLECYSTECTOMY, LAPAROSCOPIC  1998   DILATION AND CURETTAGE OF UTERUS     HYSTEROSCOPY  04/02/2012   Procedure: HYSTEROSCOPY;  Surgeon: Antionette Char, MD;  Location: [facility];  Service: Gynecology;  Laterality: N/A;  with Dilitation and curretage and attempted hydrothermal ablation   LAPAROSCOPIC TUBAL LIGATION  02/12/2012   Procedure: LAPAROSCOPIC TUBAL LIGATION;  Surgeon: Antionette Char, MD;  Location: [facility];  Service: Gynecology;  Laterality: Bilateral;   LAPAROSCOPY  at age 72   LUMBAR SPINE SURGERY     ROBOTIC ASSISTED LAP VAGINAL HYSTERECTOMY N/A 10/09/2012   Procedure: ROBOTIC ASSISTED LAPAROSCOPIC VAGINAL HYSTERECTOMY;  Surgeon: Antionette Char, MD;  Location: [facility];  Service: Gynecology;  Laterality: N/A;   TUBAL LIGATION     UMBILICAL HERNIA REPAIR     as a child    Family History  Problem Relation Age of Onset   Hypertension Mother    Cancer Maternal Aunt    Cancer Maternal Uncle    Diabetes Maternal Grandmother    Stroke  Other        aunt   Sickle cell anemia Other    Anesthesia problems Neg Hx    Hypotension Neg Hx    Malignant hyperthermia Neg Hx    Pseudochol deficiency Neg Hx     Social History Social History   Tobacco Use   Smoking status: Former    Current packs/day: 0.00    Average packs/day: 0.5 packs/day for 16.4 years (8.2 ttl pk-yrs)    Types: Cigarettes, Cigars    Start date: 07/09/1995    Quit date: 11/20/2011    Years since quitting: 11.8   Smokeless tobacco: Never   Tobacco comments:    pt quit cigarettes in 2009, pt smoke 2 black and milds a day until 09-2011  Vaping Use   Vaping status: Never Used  Substance Use Topics   Alcohol use: Yes    Alcohol/week: 0.0 standard drinks of alcohol    Comment: Occasionally, "maybe 1-2 times/month"   Drug use: Yes    Types: Marijuana    Comment: daily    Allergies  Allergen Reactions   Morphine And Codeine Anaphylaxis    Pt can take dilaudid   Zyrtec [Cetirizine] Other (See Comments)    Makes allergy - sneezing watery eyes - worse   Aspirin Itching    Pt can tolerate ibuprofen  Tramadol Itching   Vicodin [Hydrocodone-Acetaminophen] Itching    Pt can take percocet    Current Outpatient Medications  Medication Sig Dispense Refill   albuterol (PROVENTIL) (2.5 MG/3ML) 0.083% nebulizer solution Take 3 mLs (2.5 mg total) by nebulization every 2 (two) hours as needed for wheezing or shortness of breath. 75 mL 0   albuterol (VENTOLIN HFA) 108 (90 Base) MCG/ACT inhaler Inhale 2 puffs into the lungs every 6 (six) hours as needed for wheezing or shortness of breath. 1 each 0   budesonide-formoterol (SYMBICORT) 160-4.5 MCG/ACT inhaler Inhale 2 puffs into the lungs in the morning and at bedtime. 1 each 11   diazepam (VALIUM) 5 MG tablet Take 5 mg by mouth at bedtime.     fluconazole (DIFLUCAN) 150 MG tablet Take 1 tablet (150 mg total) by mouth once for 1 dose. 1 tablet 2   Multiple Vitamin (MULTIVITAMIN WITH MINERALS) TABS tablet Take 1  tablet by mouth daily.     apixaban (ELIQUIS) 5 MG TABS tablet TAKE 1 TABLET BY MOUTH 2 TIMES DAILY (Patient not taking: Reported on 09/23/2023) 60 tablet 0   feeding supplement (ENSURE ENLIVE / ENSURE PLUS) LIQD Take 237 mLs by mouth 2 (two) times daily between meals. (Patient not taking: Reported on 09/23/2023) 237 mL 12   guaiFENesin-dextromethorphan (ROBITUSSIN DM) 100-10 MG/5ML syrup Take 15 mLs by mouth every 4 (four) hours as needed for cough. (Patient not taking: Reported on 09/23/2023) 118 mL 0   Hyprom-Naphaz-Polysorb-Zn Sulf (CLEAR EYES COMPLETE OP) Place 1 drop into both eyes daily as needed (dry eyes). (Patient not taking: Reported on 09/23/2023)     ipratropium-albuterol (DUONEB) 0.5-2.5 (3) MG/3ML SOLN Take 3 mLs by nebulization every 6 (six) hours. (Patient not taking: Reported on 09/23/2023) 360 mL 3   methocarbamol (ROBAXIN) 500 MG tablet Take 1 tablet (500 mg total) by mouth 2 (two) times daily. (Patient not taking: Reported on 09/23/2023) 20 tablet 0   metroNIDAZOLE (FLAGYL) 500 MG tablet Take 1 tablet (500 mg total) by mouth 2 (two) times daily. 14 tablet 2   predniSONE (STERAPRED UNI-PAK 21 TAB) 10 MG (21) TBPK tablet 10mg  Tabs, 6 day taper. Use as directed (Patient not taking: Reported on 09/23/2023) 1 each 0   No current facility-administered medications for this visit.    Review of Systems Review of Systems Constitutional: negative for fatigue and weight loss Respiratory: negative for cough and wheezing Cardiovascular: negative for chest pain, fatigue and palpitations Gastrointestinal: negative for abdominal pain and change in bowel habits Genitourinary:positive for malodorous vaginal discharge with itching Integument/breast: negative for nipple discharge Musculoskeletal:negative for myalgias Neurological: negative for gait problems and tremors Behavioral/Psych: negative for abusive relationship, depression Endocrine: negative for temperature intolerance      Blood  pressure 123/85, pulse 86, height 5\' 6"  (1.676 m), weight 165 lb (74.8 kg), last menstrual period 05/01/2012.  Physical Exam Physical Exam General:   Alert and no distress  Skin:   no rash or abnormalities  Lungs:   clear to auscultation bilaterally  Heart:   regular rate and rhythm, S1, S2 normal, no murmur, click, rub or gallop  Breasts:   normal without suspicious masses, skin or nipple changes or axillary nodes  Abdomen:  normal findings: no organomegaly, soft, non-tender and no hernia  Pelvis:  External genitalia: normal general appearance Urinary system: urethral meatus normal and bladder without fullness, nontender Vaginal: normal without tenderness, induration or masses Cervix: absent Adnexa: normal bimanual exam Uterus: absent    I have  spent a total of 20 minutes of face-to-face time, excluding clinical staff time, reviewing notes and preparing to see patient, ordering tests and/or medications, and counseling the patient.   Data Reviewed Wet Prep and Cultures  Assessment     1. Vaginal discharge (Primary) Rx: - Cervicovaginal ancillary only( [place])  2. BV (bacterial vaginosis) Rx: - metroNIDAZOLE (FLAGYL) 500 MG tablet; Take 1 tablet (500 mg total) by mouth 2 (two) times daily.  Dispense: 14 tablet; Refill: 2  3. Candida vaginitis Rx: - fluconazole (DIFLUCAN) 150 MG tablet; Take 1 tablet (150 mg total) by mouth once for 1 dose.  Dispense: 1 tablet; Refill: 2  4. History of robot-assisted laparoscopic hysterectomy - doing well  5. Non-compliant patient - refuses mammogram - refuses colonoscopy - risks and benefits discussed, and patient strongly encouraged to get the above screens done     Plan   Follow up prn   Meds ordered this encounter  Medications   metroNIDAZOLE (FLAGYL) 500 MG tablet    Sig: Take 1 tablet (500 mg total) by mouth 2 (two) times daily.    Dispense:  14 tablet    Refill:  2    This prescription was filled on 11/11/2022. Any  refills authorized will be placed on file.   fluconazole (DIFLUCAN) 150 MG tablet    Sig: Take 1 tablet (150 mg total) by mouth once for 1 dose.    Dispense:  1 tablet    Refill:  2     Aragorn Recker A/ Clearance Coots, MD, FACOG Attending Obstetrician & Gynecologist, [facility] for [facility], [facility], [place] 09/23/2023

## 2023-09-23 NOTE — Progress Notes (Signed)
 Quit Eliquis for 2 month. Nebulizer broke so not using that. Possible vaginal infection. Not doing mammograms. Not doing colon checks.

## 2023-09-24 LAB — CERVICOVAGINAL ANCILLARY ONLY
Bacterial Vaginitis (gardnerella): POSITIVE — AB
Candida Glabrata: NEGATIVE
Candida Vaginitis: POSITIVE — AB
Comment: NEGATIVE
Comment: NEGATIVE
Comment: NEGATIVE
Comment: NEGATIVE
Trichomonas: NEGATIVE
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
## 2023-12-23 ENCOUNTER — Encounter (HOSPITAL_BASED_OUTPATIENT_CLINIC_OR_DEPARTMENT_OTHER): Payer: Self-pay

## 2023-12-23 ENCOUNTER — Emergency Department (HOSPITAL_BASED_OUTPATIENT_CLINIC_OR_DEPARTMENT_OTHER)

## 2023-12-23 ENCOUNTER — Other Ambulatory Visit: Payer: Self-pay

## 2023-12-23 DIAGNOSIS — S82141D Displaced bicondylar fracture of right tibia, subsequent encounter for closed fracture with routine healing: Secondary | ICD-10-CM | POA: Insufficient documentation

## 2023-12-23 DIAGNOSIS — J45909 Unspecified asthma, uncomplicated: Secondary | ICD-10-CM | POA: Insufficient documentation

## 2023-12-23 DIAGNOSIS — X58XXXD Exposure to other specified factors, subsequent encounter: Secondary | ICD-10-CM | POA: Insufficient documentation

## 2023-12-23 DIAGNOSIS — Z87891 Personal history of nicotine dependence: Secondary | ICD-10-CM | POA: Insufficient documentation

## 2023-12-23 DIAGNOSIS — M79661 Pain in right lower leg: Secondary | ICD-10-CM | POA: Diagnosis present
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
## 2023-12-24 ENCOUNTER — Emergency Department (HOSPITAL_BASED_OUTPATIENT_CLINIC_OR_DEPARTMENT_OTHER)
Admission: EM | Admit: 2023-12-24 | Discharge: 2023-12-24 | Disposition: A | Discharge status: Home / Self Care | Attending: Emergency Medicine | Admitting: Emergency Medicine

## 2023-12-24 DIAGNOSIS — M79661 Pain in right lower leg: Secondary | ICD-10-CM

## 2023-12-24 DIAGNOSIS — S82141D Displaced bicondylar fracture of right tibia, subsequent encounter for closed fracture with routine healing: Secondary | ICD-10-CM

## 2023-12-24 MED ORDER — HYDROCODONE-ACETAMINOPHEN 5-325 MG PO TABS: 1.0000 | ORAL_TABLET | ORAL | 0 refills | Status: DC | PRN

## 2023-12-24 NOTE — ED Provider Notes (Signed)
 [facility] Emergency Department Provider Note MRN:  [id number]  Arrival date & time: 12/24/23     Chief Complaint   Leg Pain   History of Present Illness   Megan Chandler is a 48 y.o. year-old female with history of PE presenting to the ED with chief complaint of leg pain.  Pain to the right calf since yesterday.  Injured her knee 2 weeks ago, was told that she has a tibia fracture.  She has follow-up established for this injury.  Was concerned about a blood clot in her leg because of the calf pain.  History of blood clots.  Missed a few doses of her blood thinner.  Review of Systems  A thorough review of systems was obtained and all systems are negative except as noted in the HPI and PMH.   Patient's Health History    Past Medical History:  Diagnosis Date   Adenomyosis    Anemia    Anxiety    Asthma    daily inhaler use   Bartholin's cyst    hx of   Bronchitis    Endometriosis    GERD (gastroesophageal reflux disease)    Menometrorrhagia    Pneumonia 08/18/2018   Pulmonary embolism (HCC)    11/2011/ states no since  anticoagulation 1/14   Sciatica     Past Surgical History:  Procedure Laterality Date   ABDOMINAL HYSTERECTOMY     BACK SURGERY     07/2011   BILATERAL SALPINGECTOMY Bilateral 10/09/2012   Procedure: BILATERAL SALPINGECTOMY;  Surgeon: Abdul Hodgkin, MD;  Location: [facility];  Service: Gynecology;  Laterality: Bilateral;   CERVICAL SPINE SURGERY  2009   CESAREAN SECTION  1996   CHOLECYSTECTOMY, LAPAROSCOPIC  1998   DILATION AND CURETTAGE OF UTERUS     HYSTEROSCOPY  04/02/2012   Procedure: HYSTEROSCOPY;  Surgeon: Abdul Hodgkin, MD;  Location: [facility];  Service: Gynecology;  Laterality: N/A;  with Dilitation and curretage and attempted hydrothermal ablation   LAPAROSCOPIC TUBAL LIGATION  02/12/2012   Procedure: LAPAROSCOPIC TUBAL LIGATION;  Surgeon: Abdul Hodgkin, MD;  Location: [facility];  Service: Gynecology;  Laterality:  Bilateral;   LAPAROSCOPY  at age 29   LUMBAR SPINE SURGERY     ROBOTIC ASSISTED LAP VAGINAL HYSTERECTOMY N/A 10/09/2012   Procedure: ROBOTIC ASSISTED LAPAROSCOPIC VAGINAL HYSTERECTOMY;  Surgeon: Abdul Hodgkin, MD;  Location: [facility];  Service: Gynecology;  Laterality: N/A;   TUBAL LIGATION     UMBILICAL HERNIA REPAIR     as a child    Family History  Problem Relation Age of Onset   Hypertension Mother    Cancer Maternal Aunt    Cancer Maternal Uncle    Diabetes Maternal Grandmother    Stroke Other        aunt   Sickle cell anemia Other    Anesthesia problems Neg Hx    Hypotension Neg Hx    Malignant hyperthermia Neg Hx    Pseudochol deficiency Neg Hx     Social History   Socioeconomic History   Marital status: Single    Spouse name: Not on file   Number of children: Not on file   Years of education: Not on file   Highest education level: Not on file  Occupational History   Occupation: disabled  Tobacco Use   Smoking status: Former    Current packs/day: 0.00    Average packs/day: 0.5 packs/day for 16.4 years (8.2 ttl pk-yrs)    Types: Cigarettes, Cigars  Start date: 07/09/1995    Quit date: 11/20/2011    Years since quitting: 12.1   Smokeless tobacco: Never   Tobacco comments:    pt quit cigarettes in 2009, pt smoke 2 black and milds a day until 09-2011  Vaping Use   Vaping status: Never Used  Substance and Sexual Activity   Alcohol use: Yes    Alcohol/week: 0.0 standard drinks of alcohol    Comment: Occasionally, maybe 1-2 times/month   Drug use: Yes    Types: Marijuana    Comment: daily   Sexual activity: Yes    Partners: Male    Birth control/protection: None  Other Topics Concern   Not on file  Social History Narrative   Not on file   Social Drivers of Health   Financial Resource Strain: Not on file  Food Insecurity: Not on file  Transportation Needs: Not on file  Physical Activity: Not on file  Stress: Not on file  Social Connections: Unknown  (11/20/2021)   Received from [facility]   Social Network    Social Network: Not on file  Intimate Partner Violence: Unknown (10/12/2021)   Received from [facility]   HITS    Physically Hurt: Not on file    Insult or Talk Down To: Not on file    Threaten Physical Harm: Not on file    Scream or Curse: Not on file     Physical Exam   Vitals:   12/23/23 2230  BP: 110/74  Pulse: 71  Resp: 16  Temp: 97.8 F (36.6 C)  SpO2: 100%    CONSTITUTIONAL: Well-appearing, NAD NEURO/PSYCH:  Alert and oriented x 3, no focal deficits EYES:  eyes equal and reactive ENT/NECK:  no LAD, no JVD CARDIO: Regular rate, well-perfused, normal S1 and S2 PULM:  CTAB no wheezing or rhonchi GI/GU:  non-distended, non-tender MSK/SPINE:  No gross deformities, no edema SKIN:  no rash, atraumatic   *Additional and/or pertinent findings included in MDM below  Diagnostic and Interventional Summary    EKG Interpretation Date/Time:    Ventricular Rate:    PR Interval:    QRS Duration:    QT Interval:    QTC Calculation:   R Axis:      Text Interpretation:         Labs Reviewed - No data to display  US  Venous Img Lower Unilateral Right  Final Result      Medications - No data to display   Procedures  /  Critical Care Procedures  ED Course and Medical Decision Making  Initial Impression and Ddx Patient has some tenderness to the right calf with palpation.  Neurovascularly intact extremity.  Denies any other symptoms, no chest pain or shortness of breath.  Per chart review she did sustain a tibial plateau fracture, has a CT scheduled soon.  Question DVT versus some type of MSK related pain.  Past medical/surgical history that increases complexity of ED encounter: PE  Interpretation of Diagnostics I personally reviewed the venous ultrasound and my interpretation is as follows: No DVT    Patient Reassessment and Ultimate Disposition/Management     Discharge.  Patient management  required discussion with the following services or consulting groups:  None  Complexity of Problems Addressed Acute illness or injury that poses threat of life of bodily function  Additional Data Reviewed and Analyzed Further history obtained from: Care Everywhere  Additional Factors Impacting ED Encounter Risk Prescriptions  Merrick Abe. Harless Lien, MD [facility] Emergency Medicine  [facility] Health [email or web address]  Final Clinical Impressions(s) / ED Diagnoses     ICD-10-CM   1. Right calf pain  M79.661     2. Closed fracture of right tibial plateau with routine healing, subsequent encounter  S82.141D       ED Discharge Orders          Ordered    HYDROcodone -acetaminophen  (NORCO/VICODIN) 5-325 MG tablet  Every 4 hours PRN        12/24/23 0053             Discharge Instructions Discussed with and Provided to Patient:    Discharge Instructions      You were evaluated in the Emergency Department and after careful evaluation, we did not find any emergent condition requiring admission or further testing in the hospital.  Your exam/testing today is overall reassuring.  Ultrasound did not show any signs of blood clot.  Keep your follow-up for your knee injury.  Can use the Norco as needed for pain.  Please return to the Emergency Department if you experience any worsening of your condition.   Thank you for allowing us  to be a part of your care.      Edson Graces, MD 12/24/23 [phone]

## 2023-12-24 NOTE — Discharge Instructions (Signed)
 You were evaluated in the Emergency Department and after careful evaluation, we did not find any emergent condition requiring admission or further testing in the hospital.  Your exam/testing today is overall reassuring.  Ultrasound did not show any signs of blood clot.  Keep your follow-up for your knee injury.  Can use the Norco as needed for pain.  Please return to the Emergency Department if you experience any worsening of your condition.   Thank you for allowing us  to be a part of your care.

## 2024-01-15 ENCOUNTER — Ambulatory Visit: Admitting: Obstetrics

## 2024-01-21 NOTE — Telephone Encounter (Signed)
 Referral for Dr. Georgina in system.

## 2024-01-21 NOTE — Telephone Encounter (Signed)
-----   Message from Lonni Manuel, PA-C sent at 01/14/2024  9:50 AM EDT ----- Please schedule with Dr. Georgina, thanks ----- Message ----- From: Delmar Herbert Winfred Booker Results In [place] [id number] Sent: 01/13/2024   5:26 PM EDT To: Lonni Hamilton Flowers, PA-C

## 2024-06-11 ENCOUNTER — Encounter (HOSPITAL_BASED_OUTPATIENT_CLINIC_OR_DEPARTMENT_OTHER): Payer: Self-pay

## 2024-06-11 ENCOUNTER — Ambulatory Visit (HOSPITAL_BASED_OUTPATIENT_CLINIC_OR_DEPARTMENT_OTHER): Payer: Self-pay

## 2024-06-11 ENCOUNTER — Ambulatory Visit (HOSPITAL_BASED_OUTPATIENT_CLINIC_OR_DEPARTMENT_OTHER)

## 2024-06-11 VITALS — BP 122/89 | HR 78 | Temp 98.1°F | Ht 66.0 in | Wt 167.4 lb

## 2024-06-11 DIAGNOSIS — J40 Bronchitis, not specified as acute or chronic: Secondary | ICD-10-CM | POA: Diagnosis not present

## 2024-06-11 DIAGNOSIS — Z7689 Persons encountering health services in other specified circumstances: Secondary | ICD-10-CM | POA: Diagnosis not present

## 2024-06-11 MED ORDER — HYDROCODONE BIT-HOMATROP MBR 5-1.5 MG/5ML PO SOLN: 5.0000 mL | Freq: Three times a day (TID) | ORAL | 0 refills | Status: AC | PRN

## 2024-06-11 MED ORDER — ALBUTEROL SULFATE HFA 108 (90 BASE) MCG/ACT IN AERS: 2.0000 | INHALATION_SPRAY | Freq: Four times a day (QID) | RESPIRATORY_TRACT | 2 refills | Status: AC | PRN

## 2024-06-11 MED ORDER — BUDESONIDE-FORMOTEROL FUMARATE 160-4.5 MCG/ACT IN AERO: 2.0000 | INHALATION_SPRAY | Freq: Two times a day (BID) | RESPIRATORY_TRACT | 3 refills | Status: AC

## 2024-06-11 MED ORDER — ALBUTEROL SULFATE (2.5 MG/3ML) 0.083% IN NEBU: 2.5000 mg | INHALATION_SOLUTION | Freq: Four times a day (QID) | RESPIRATORY_TRACT | 1 refills | Status: AC | PRN

## 2024-06-11 NOTE — Progress Notes (Unsigned)
 New Patient Office Visit  Subjective:   Megan Chandler 11/18/1975 06/11/2024  Chief Complaint  Patient presents with   New Patient (Initial Visit)    Pt is here to get established with the practice. States has not been feeling well for about 1 week. Having complaints including wheezing, couching, fever, sweats.    History of Present Illness         HPI: Megan Chandler presents today to establish care at [facility] at [facility]. Introduced to publishing rights manager role and practice setting with verbalized understanding by patient.  All questions answered.   Last PCP: unsure Last annual physical: unsure Concerns: See below   Feeling unwell since thanksgiving - went to [facility] and given pills in which she finished the course but did not help her symptoms. Pt endorses wheezing, cough, and congestion. Endorses fever at home as well as diarrhea. Pt was then seen at Urgent care and given a steroid taper and cough medicine. Pt states she has a hx of asthma and has not had her daily or rescue inhalers in a while.   Pt otherwise here to establish care and has no other concerns.       The following portions of the patient's history were reviewed and updated as appropriate: past medical history, past surgical history, family history, social history, allergies, medications, and problem list.   Patient Active Problem List   Diagnosis Date Noted   Marijuana dependence (HCC) 09/14/2021   CAP (community acquired pneumonia) 08/18/2018   Viral gastroenteritis 08/18/2018   Hypokalemia 08/18/2018   Asthma exacerbation 08/17/2018   Frequent falls 12/09/2016   Grief 03/14/2016   Left lumbosacral radiculopathy 01/02/2015   GERD without esophagitis 02/26/2013   Shortness of breath 04/23/2012   Pulmonary embolus (HCC) 11/20/2011   Anxiety 11/20/2011   Endometriosis 11/20/2011   Asthma 11/20/2011   Past Medical History:  Diagnosis  Date   Adenomyosis    Anemia    Anxiety    Asthma    daily inhaler use   Bartholin's cyst    hx of   Bronchitis    Endometriosis    GERD (gastroesophageal reflux disease)    Menometrorrhagia    Pneumonia 08/18/2018   Pulmonary embolism (HCC)    11/2011/ states no since  anticoagulation 1/14   Sciatica    Past Surgical History:  Procedure Laterality Date   ABDOMINAL HYSTERECTOMY     BACK SURGERY     07/2011   BILATERAL SALPINGECTOMY Bilateral 10/09/2012   Procedure: BILATERAL SALPINGECTOMY;  Surgeon: Olam Mill, MD;  Location: [facility];  Service: Gynecology;  Laterality: Bilateral;   CERVICAL SPINE SURGERY  2009   CESAREAN SECTION  1996   CHOLECYSTECTOMY, LAPAROSCOPIC  1998   DILATION AND CURETTAGE OF UTERUS     HYSTEROSCOPY  04/02/2012   Procedure: HYSTEROSCOPY;  Surgeon: Olam Mill, MD;  Location: [facility];  Service: Gynecology;  Laterality: N/A;  with Dilitation and curretage and attempted hydrothermal ablation   LAPAROSCOPIC TUBAL LIGATION  02/12/2012   Procedure: LAPAROSCOPIC TUBAL LIGATION;  Surgeon: Olam Mill, MD;  Location: [facility];  Service: Gynecology;  Laterality: Bilateral;   LAPAROSCOPY  at age 22   LUMBAR SPINE SURGERY     ROBOTIC ASSISTED LAP VAGINAL HYSTERECTOMY N/A 10/09/2012   Procedure: ROBOTIC ASSISTED LAPAROSCOPIC VAGINAL HYSTERECTOMY;  Surgeon: Olam Mill, MD;  Location: [facility];  Service: Gynecology;  Laterality: N/A;   TUBAL LIGATION  UMBILICAL HERNIA REPAIR     as a child   Family History  Problem Relation Age of Onset   Hypertension Mother    Cancer Maternal Aunt    Cancer Maternal Uncle    Diabetes Maternal Grandmother    Stroke Other        aunt   Sickle cell anemia Other    Anesthesia problems Neg Hx    Hypotension Neg Hx    Malignant hyperthermia Neg Hx    Pseudochol deficiency Neg Hx    Social History   Socioeconomic History   Marital status: Single    Spouse name: Not on file   Number of children: Not on  file   Years of education: Not on file   Highest education level: Not on file  Occupational History   Occupation: disabled  Tobacco Use   Smoking status: Former    Current packs/day: 0.00    Average packs/day: 0.5 packs/day for 16.4 years (8.2 ttl pk-yrs)    Types: Cigarettes, Cigars    Start date: 07/09/1995    Quit date: 11/20/2011    Years since quitting: 12.5   Smokeless tobacco: Never   Tobacco comments:    pt quit cigarettes in 2009, pt smoke 2 black and milds a day until 09-2011  Vaping Use   Vaping status: Never Used  Substance and Sexual Activity   Alcohol use: Yes    Alcohol/week: 0.0 standard drinks of alcohol    Comment: Occasionally, maybe 1-2 times/month   Drug use: Yes    Types: Marijuana    Comment: daily   Sexual activity: Yes    Partners: Male    Birth control/protection: None  Other Topics Concern   Not on file  Social History Narrative   Not on file   Social Drivers of Health   Financial Resource Strain: Not on file  Food Insecurity: Not on file  Transportation Needs: Not on file  Physical Activity: Not on file  Stress: Not on file  Social Connections: Not on file  Intimate Partner Violence: Not on file   Outpatient Medications Prior to Visit  Medication Sig Dispense Refill   albuterol  (PROVENTIL ) (2.5 MG/3ML) 0.083% nebulizer solution Take 3 mLs (2.5 mg total) by nebulization every 2 (two) hours as needed for wheezing or shortness of breath. 75 mL 0   albuterol  (VENTOLIN  HFA) 108 (90 Base) MCG/ACT inhaler Inhale 2 puffs into the lungs every 6 (six) hours as needed for wheezing or shortness of breath. 1 each 0   budesonide -formoterol  (SYMBICORT ) 160-4.5 MCG/ACT inhaler Inhale 2 puffs into the lungs in the morning and at bedtime. 1 each 11   diazepam  (VALIUM ) 5 MG tablet Take 5 mg by mouth at bedtime.     ipratropium-albuterol  (DUONEB) 0.5-2.5 (3) MG/3ML SOLN Take 3 mLs by nebulization every 6 (six) hours. 360 mL 3   meloxicam (MOBIC) 7.5 MG tablet  Take 7.5 mg by mouth as needed for pain.     metroNIDAZOLE  (FLAGYL ) 500 MG tablet Take 1 tablet (500 mg total) by mouth 2 (two) times daily. (Patient taking differently: Take 500 mg by mouth as needed.) 14 tablet 2   Multiple Vitamin (MULTIVITAMIN WITH MINERALS) TABS tablet Take 1 tablet by mouth daily.     predniSONE  (STERAPRED UNI-PAK 21 TAB) 10 MG (21) TBPK tablet 10mg  Tabs, 6 day taper. Use as directed 1 each 0   apixaban  (ELIQUIS ) 5 MG TABS tablet TAKE 1 TABLET BY MOUTH 2 TIMES DAILY (Patient not taking: Reported  on 06/11/2024) 60 tablet 0   feeding supplement (ENSURE ENLIVE / ENSURE PLUS) LIQD Take 237 mLs by mouth 2 (two) times daily between meals. (Patient not taking: Reported on 06/11/2024) 237 mL 12   guaiFENesin -dextromethorphan  (ROBITUSSIN DM) 100-10 MG/5ML syrup Take 15 mLs by mouth every 4 (four) hours as needed for cough. (Patient not taking: Reported on 06/11/2024) 118 mL 0   HYDROcodone -acetaminophen  (NORCO/VICODIN) 5-325 MG tablet Take 1 tablet by mouth every 4 (four) hours as needed. (Patient not taking: Reported on 06/11/2024) 6 tablet 0   Hyprom-Naphaz-Polysorb-Zn Sulf (CLEAR EYES COMPLETE OP) Place 1 drop into both eyes daily as needed (dry eyes). (Patient not taking: Reported on 06/11/2024)     methocarbamol  (ROBAXIN ) 500 MG tablet Take 1 tablet (500 mg total) by mouth 2 (two) times daily. (Patient not taking: Reported on 06/11/2024) 20 tablet 0   No facility-administered medications prior to visit.   Allergies  Allergen Reactions   Morphine And Codeine  Anaphylaxis    Pt can take dilaudid    Zyrtec [Cetirizine] Other (See Comments)    Makes allergy - sneezing watery eyes - worse   Aspirin Itching    Pt can tolerate ibuprofen    Tramadol  Itching    ROS: A complete ROS was performed with pertinent positives/negatives noted in the HPI. The remainder of the ROS are negative.   Objective:   Today's Vitals   06/11/24 1112  BP: 122/89  Pulse: 78  Temp: 98.1 F (36.7 C)   TempSrc: Oral  SpO2: 100%  Weight: 167 lb 6.4 oz (75.9 kg)  Height: 5' 6 (1.676 m)    GENERAL: Well-appearing, in NAD. Well nourished. SKIN: Pink, warm and dry. No rash, lesion, ulceration, or ecchymoses.  Head: Normocephalic. RESPIRATORY: Chest wall symmetrical. Respirations even and non-labored. Both expiratory and inspiratory wheezing in all lobes. No rales or crackles noted. CARDIAC: S1, S2 present, regular rate.   Health Maintenance Due  Topic Date Due   DTaP/Tdap/Td (1 - Tdap) Never done   Pneumococcal Vaccine: 50+ Years (1 of 2 - PCV) Never done   Hepatitis B Vaccines 19-59 Average Risk (1 of 3 - 19+ 3-dose series) Never done   Mammogram  Never done   Colonoscopy  Never done   Zoster Vaccines- Shingrix (1 of 2) Never done   COVID-19 Vaccine (1 - 2025-26 season) Never done    No results found for any visits on 06/11/24.     Assessment & Plan:  1. Encounter to establish care with new doctor (Primary) Discussed following up in a few months for annual physical and labs. Discussed personal and familial history, medications, and allergies.   2. Bronchitis Discussed restarting her asthma daily medication and rescue inhaler. Discussed the use of nebulizer machine for acute flares such as this one. Chest xray ordered to rule out any infectious disease process. Discussed red flag symptoms and when to go to ED or PCP. - budesonide -formoterol  (SYMBICORT ) 160-4.5 MCG/ACT inhaler; Inhale 2 puffs into the lungs 2 (two) times daily.  Dispense: 1 each; Refill: 3 - albuterol  (PROVENTIL ) (2.5 MG/3ML) 0.083% nebulizer solution; Take 3 mLs (2.5 mg total) by nebulization every 6 (six) hours as needed for wheezing or shortness of breath.  Dispense: 150 mL; Refill: 1 - albuterol  (VENTOLIN  HFA) 108 (90 Base) MCG/ACT inhaler; Inhale 2 puffs into the lungs every 6 (six) hours as needed for wheezing or shortness of breath.  Dispense: 8 g; Refill: 2 - DG Chest 2 View; Future - HYDROcodone   bit-homatropine (HYCODAN) 5-1.5  MG/5ML syrup; Take 5 mLs by mouth every 8 (eight) hours as needed for cough.  Dispense: 120 mL; Refill: 0   Patient to reach out to office if new, worrisome, or unresolved symptoms arise or if no improvement in patient's condition. Patient verbalized understanding and is agreeable to treatment plan. All questions answered to patient's satisfaction.    No follow-ups on file.    Lauraine Almarie Angus DNP, FNP-C

## 2024-06-11 NOTE — Patient Instructions (Signed)
 Please pick up the nebulizer machine at the pharmacy downstairs.

## 2024-06-11 NOTE — Progress Notes (Signed)
 Megan Chandler, I received your chest xr result and everything looks normal. I want you to continue with the steroids previously prescribed and the medications I sent in for you today. If you have worsening shortness of breath, chest pain, or uncontrolled fever please go to the ER. Otherwise I will see you for your asthma follow up and physical. Please don't hesitate to reach out.  Megan Chandler

## 2024-06-14 ENCOUNTER — Telehealth (HOSPITAL_BASED_OUTPATIENT_CLINIC_OR_DEPARTMENT_OTHER): Payer: Self-pay

## 2024-06-14 NOTE — Telephone Encounter (Signed)
 Called patient to schedule appointment and patient stated she needed a work note for missing time from being sick to show when she can go back to work and that her pharmacy only received the inhaler but not get the other medications please advise

## 2024-06-14 NOTE — Telephone Encounter (Signed)
 Routing message from pt to Lauraine for review. Pt was just seen as a new pt 12/5. Pt has an upcoming appt also scheduled 12/11.

## 2024-06-17 ENCOUNTER — Ambulatory Visit (HOSPITAL_BASED_OUTPATIENT_CLINIC_OR_DEPARTMENT_OTHER)

## 2024-06-24 ENCOUNTER — Telehealth (HOSPITAL_BASED_OUTPATIENT_CLINIC_OR_DEPARTMENT_OTHER): Payer: Self-pay

## 2024-06-24 NOTE — Telephone Encounter (Signed)
 Spoke with patient about tb testing options and dates more suitalbe for test, and patient stated she would see if there are other options first before contacting our office back as her job needs her to get the test done and results back as soon as possible.

## 2024-06-24 NOTE — Telephone Encounter (Signed)
 Copied from CRM #[id number]. Topic: Clinical - Request for Lab/Test Order >> Jun 23, 2024  3:59 PM Hadassah PARAS wrote: Reason for CRM: Pt is calling to schedule TB testing. She will be in office Dec 22 and was wanting this to be done same day. Please advise pt on # [phone]   Please advise patient she will not be able to get a TB test 12/22 as we are closed 24-25 and will not be able to read it.

## 2024-06-28 ENCOUNTER — Ambulatory Visit (HOSPITAL_BASED_OUTPATIENT_CLINIC_OR_DEPARTMENT_OTHER)

## 2024-06-29 ENCOUNTER — Other Ambulatory Visit (HOSPITAL_BASED_OUTPATIENT_CLINIC_OR_DEPARTMENT_OTHER)

## 2024-07-05 ENCOUNTER — Ambulatory Visit (INDEPENDENT_AMBULATORY_CARE_PROVIDER_SITE_OTHER)

## 2024-07-05 ENCOUNTER — Telehealth (HOSPITAL_BASED_OUTPATIENT_CLINIC_OR_DEPARTMENT_OTHER): Payer: Self-pay

## 2024-07-05 DIAGNOSIS — Z111 Encounter for screening for respiratory tuberculosis: Secondary | ICD-10-CM

## 2024-07-05 NOTE — Telephone Encounter (Signed)
 Patient has requested Prednisone  for chronic back pain. Patient states that she has rods and screws in her back and Prednisone  helps calm the pain. Patient is ok with Mychart response.

## 2024-07-05 NOTE — Progress Notes (Signed)
 Tuberculin skin test applied to right ventral forearm. Explained how to read the test, measuring induration not just erythema; she will come into office if test appears positive.

## 2024-07-07 ENCOUNTER — Ambulatory Visit (HOSPITAL_BASED_OUTPATIENT_CLINIC_OR_DEPARTMENT_OTHER): Payer: Self-pay

## 2024-07-07 ENCOUNTER — Ambulatory Visit (INDEPENDENT_AMBULATORY_CARE_PROVIDER_SITE_OTHER)

## 2024-07-07 DIAGNOSIS — Z111 Encounter for screening for respiratory tuberculosis: Secondary | ICD-10-CM | POA: Diagnosis not present

## 2024-07-07 LAB — TB SKIN TEST
Induration: 0 mm
TB Skin Test: NEGATIVE

## 2024-07-07 NOTE — Progress Notes (Signed)
 TB skin test negative. Pt made aware of result while in office.

## 2024-07-07 NOTE — Progress Notes (Signed)
"  PPD Reading Note  PPD read and results entered in EpicCare.  Result: 0 mm induration.  Interpretation: Negative  If test not read within 48-72 hours of initial placement, patient advised to repeat in other arm 1-3 weeks after this test.  Allergic reaction: no   "

## 2024-08-05 ENCOUNTER — Ambulatory Visit (HOSPITAL_BASED_OUTPATIENT_CLINIC_OR_DEPARTMENT_OTHER)

## 2024-08-05 ENCOUNTER — Encounter (HOSPITAL_BASED_OUTPATIENT_CLINIC_OR_DEPARTMENT_OTHER): Payer: Self-pay
